# Patient Record
Sex: Female | Born: 1980 | Race: Black or African American | Hispanic: No | Smoking: Never smoker
Health system: Southern US, Community
[De-identification: ages and names within clinical notes are randomized; demographics above are authoritative.]

## PROBLEM LIST (undated history)

## (undated) DIAGNOSIS — Z319 Encounter for procreative management, unspecified: Secondary | ICD-10-CM

## (undated) DIAGNOSIS — N898 Other specified noninflammatory disorders of vagina: Secondary | ICD-10-CM

## (undated) DIAGNOSIS — M6289 Other specified disorders of muscle: Secondary | ICD-10-CM

## (undated) DIAGNOSIS — N926 Irregular menstruation, unspecified: Secondary | ICD-10-CM

## (undated) DIAGNOSIS — R635 Abnormal weight gain: Secondary | ICD-10-CM

## (undated) DIAGNOSIS — G8929 Other chronic pain: Secondary | ICD-10-CM

## (undated) DIAGNOSIS — R102 Pelvic and perineal pain: Secondary | ICD-10-CM

## (undated) DIAGNOSIS — N343 Urethral syndrome, unspecified: Secondary | ICD-10-CM

## (undated) DIAGNOSIS — H8109 Meniere's disease, unspecified ear: Secondary | ICD-10-CM

## (undated) DIAGNOSIS — B009 Herpesviral infection, unspecified: Secondary | ICD-10-CM

## (undated) DIAGNOSIS — N649 Disorder of breast, unspecified: Secondary | ICD-10-CM

## (undated) DIAGNOSIS — I10 Essential (primary) hypertension: Secondary | ICD-10-CM

## (undated) HISTORY — DX: Herpesviral infection, unspecified: B00.9

## (undated) HISTORY — DX: Abnormal weight gain: R63.5

## (undated) HISTORY — PX: BREAST REDUCTION SURGERY: SHX8

## (undated) HISTORY — DX: Essential (primary) hypertension: I10

## (undated) HISTORY — DX: Other specified noninflammatory disorders of vagina: N89.8

## (undated) HISTORY — DX: Disorder of breast, unspecified: N64.9

## (undated) HISTORY — DX: Irregular menstruation, unspecified: N92.6

## (undated) HISTORY — PX: REDUCTION MAMMAPLASTY: SUR839

## (undated) HISTORY — DX: Encounter for procreative management, unspecified: Z31.9

---

## 2001-05-12 ENCOUNTER — Emergency Department (HOSPITAL_COMMUNITY): Admission: EM | Admit: 2001-05-12 | Discharge: 2001-05-12 | Payer: Self-pay | Admitting: Emergency Medicine

## 2001-05-12 ENCOUNTER — Encounter: Payer: Self-pay | Admitting: Emergency Medicine

## 2001-10-16 ENCOUNTER — Emergency Department (HOSPITAL_COMMUNITY): Admission: EM | Admit: 2001-10-16 | Discharge: 2001-10-17 | Payer: Self-pay | Admitting: *Deleted

## 2001-10-22 ENCOUNTER — Emergency Department (HOSPITAL_COMMUNITY): Admission: EM | Admit: 2001-10-22 | Discharge: 2001-10-22 | Payer: Self-pay | Admitting: *Deleted

## 2002-04-26 ENCOUNTER — Emergency Department (HOSPITAL_COMMUNITY): Admission: EM | Admit: 2002-04-26 | Discharge: 2002-04-26 | Payer: Self-pay | Admitting: Emergency Medicine

## 2002-06-03 ENCOUNTER — Emergency Department (HOSPITAL_COMMUNITY): Admission: EM | Admit: 2002-06-03 | Discharge: 2002-06-03 | Payer: Self-pay | Admitting: *Deleted

## 2002-09-23 ENCOUNTER — Emergency Department (HOSPITAL_COMMUNITY): Admission: EM | Admit: 2002-09-23 | Discharge: 2002-09-23 | Payer: Self-pay | Admitting: Emergency Medicine

## 2003-02-23 ENCOUNTER — Emergency Department (HOSPITAL_COMMUNITY): Admission: EM | Admit: 2003-02-23 | Discharge: 2003-02-23 | Payer: Self-pay | Admitting: Emergency Medicine

## 2003-07-07 ENCOUNTER — Emergency Department (HOSPITAL_COMMUNITY): Admission: EM | Admit: 2003-07-07 | Discharge: 2003-07-07 | Payer: Self-pay | Admitting: Emergency Medicine

## 2003-07-07 ENCOUNTER — Encounter: Payer: Self-pay | Admitting: Emergency Medicine

## 2003-08-21 ENCOUNTER — Emergency Department (HOSPITAL_COMMUNITY): Admission: EM | Admit: 2003-08-21 | Discharge: 2003-08-21 | Payer: Self-pay | Admitting: Emergency Medicine

## 2003-08-31 ENCOUNTER — Emergency Department (HOSPITAL_COMMUNITY): Admission: EM | Admit: 2003-08-31 | Discharge: 2003-08-31 | Payer: Self-pay | Admitting: Emergency Medicine

## 2004-04-04 ENCOUNTER — Emergency Department (HOSPITAL_COMMUNITY): Admission: EM | Admit: 2004-04-04 | Discharge: 2004-04-04 | Payer: Self-pay | Admitting: Emergency Medicine

## 2004-08-08 ENCOUNTER — Emergency Department (HOSPITAL_COMMUNITY): Admission: EM | Admit: 2004-08-08 | Discharge: 2004-08-08 | Payer: Self-pay | Admitting: Interventional Radiology

## 2004-08-18 ENCOUNTER — Emergency Department: Payer: Self-pay | Admitting: Emergency Medicine

## 2004-09-08 ENCOUNTER — Emergency Department (HOSPITAL_COMMUNITY): Admission: EM | Admit: 2004-09-08 | Discharge: 2004-09-08 | Payer: Self-pay | Admitting: Emergency Medicine

## 2004-09-25 ENCOUNTER — Emergency Department (HOSPITAL_COMMUNITY): Admission: EM | Admit: 2004-09-25 | Discharge: 2004-09-25 | Payer: Self-pay | Admitting: Emergency Medicine

## 2004-10-16 ENCOUNTER — Emergency Department (HOSPITAL_COMMUNITY): Admission: EM | Admit: 2004-10-16 | Discharge: 2004-10-16 | Payer: Self-pay | Admitting: *Deleted

## 2005-03-30 ENCOUNTER — Emergency Department (HOSPITAL_COMMUNITY): Admission: EM | Admit: 2005-03-30 | Discharge: 2005-03-30 | Payer: Self-pay | Admitting: Emergency Medicine

## 2005-12-07 ENCOUNTER — Emergency Department (HOSPITAL_COMMUNITY): Admission: EM | Admit: 2005-12-07 | Discharge: 2005-12-07 | Payer: Self-pay | Admitting: Emergency Medicine

## 2007-04-21 ENCOUNTER — Emergency Department (HOSPITAL_COMMUNITY): Admission: EM | Admit: 2007-04-21 | Discharge: 2007-04-22 | Payer: Self-pay | Admitting: Emergency Medicine

## 2007-11-03 ENCOUNTER — Emergency Department (HOSPITAL_COMMUNITY): Admission: EM | Admit: 2007-11-03 | Discharge: 2007-11-03 | Payer: Self-pay | Admitting: Emergency Medicine

## 2007-11-04 ENCOUNTER — Ambulatory Visit (HOSPITAL_COMMUNITY): Admission: RE | Admit: 2007-11-04 | Discharge: 2007-11-04 | Payer: Self-pay | Admitting: Emergency Medicine

## 2008-06-14 ENCOUNTER — Emergency Department (HOSPITAL_COMMUNITY): Admission: EM | Admit: 2008-06-14 | Discharge: 2008-06-14 | Payer: Self-pay | Admitting: Emergency Medicine

## 2009-03-09 ENCOUNTER — Emergency Department (HOSPITAL_COMMUNITY): Admission: EM | Admit: 2009-03-09 | Discharge: 2009-03-09 | Payer: Self-pay | Admitting: Emergency Medicine

## 2009-09-28 HISTORY — PX: BREAST BIOPSY: SHX20

## 2010-01-04 ENCOUNTER — Emergency Department: Payer: Self-pay | Admitting: Unknown Physician Specialty

## 2010-01-04 ENCOUNTER — Emergency Department: Payer: Self-pay | Admitting: Internal Medicine

## 2010-01-17 ENCOUNTER — Emergency Department: Payer: Self-pay | Admitting: Emergency Medicine

## 2010-03-15 ENCOUNTER — Emergency Department: Payer: Self-pay | Admitting: Emergency Medicine

## 2010-05-20 ENCOUNTER — Emergency Department: Payer: Self-pay | Admitting: Emergency Medicine

## 2010-07-09 ENCOUNTER — Ambulatory Visit (HOSPITAL_COMMUNITY): Admission: RE | Admit: 2010-07-09 | Discharge: 2010-07-09 | Payer: Self-pay | Admitting: Family Medicine

## 2010-07-16 ENCOUNTER — Ambulatory Visit (HOSPITAL_COMMUNITY): Admission: RE | Admit: 2010-07-16 | Discharge: 2010-07-16 | Payer: Self-pay | Admitting: Family Medicine

## 2010-08-02 ENCOUNTER — Emergency Department: Payer: Self-pay | Admitting: Emergency Medicine

## 2011-01-05 LAB — URINE CULTURE: Colony Count: >=100000

## 2011-01-05 LAB — GC/CHLAMYDIA PROBE AMP, GENITAL: GC Probe Amp, Genital: NEGATIVE

## 2011-01-05 LAB — URINALYSIS, ROUTINE W REFLEX MICROSCOPIC
Bilirubin Urine: NEGATIVE
Hgb urine dipstick: NEGATIVE
Ketones, ur: NEGATIVE mg/dL
Nitrite: NEGATIVE

## 2011-01-05 LAB — WET PREP, GENITAL

## 2011-01-21 ENCOUNTER — Emergency Department (HOSPITAL_COMMUNITY)
Admission: EM | Admit: 2011-01-21 | Discharge: 2011-01-21 | Disposition: A | Discharge status: Home / Self Care | Payer: Self-pay | Attending: Emergency Medicine | Admitting: Emergency Medicine

## 2011-01-21 DIAGNOSIS — R109 Unspecified abdominal pain: Secondary | ICD-10-CM | POA: Insufficient documentation

## 2011-01-21 DIAGNOSIS — N898 Other specified noninflammatory disorders of vagina: Secondary | ICD-10-CM | POA: Insufficient documentation

## 2011-01-21 LAB — WET PREP, GENITAL: Yeast Wet Prep HPF POC: NONE SEEN

## 2011-01-21 LAB — URINALYSIS, ROUTINE W REFLEX MICROSCOPIC
Ketones, ur: NEGATIVE mg/dL
Nitrite: NEGATIVE
Protein, ur: NEGATIVE mg/dL

## 2011-01-21 LAB — POCT PREGNANCY, URINE: Preg Test, Ur: NEGATIVE

## 2011-01-23 LAB — URINE CULTURE: Culture  Setup Time: 201204261952

## 2011-04-30 ENCOUNTER — Encounter: Payer: Self-pay | Admitting: *Deleted

## 2011-04-30 ENCOUNTER — Emergency Department (HOSPITAL_COMMUNITY)
Admission: EM | Admit: 2011-04-30 | Discharge: 2011-04-30 | Discharge status: Against Medical Advice | Payer: BC Managed Care – PPO | Attending: Emergency Medicine | Admitting: Emergency Medicine

## 2011-04-30 DIAGNOSIS — E876 Hypokalemia: Secondary | ICD-10-CM | POA: Insufficient documentation

## 2011-04-30 DIAGNOSIS — N760 Acute vaginitis: Secondary | ICD-10-CM | POA: Insufficient documentation

## 2011-04-30 LAB — COMPREHENSIVE METABOLIC PANEL
AST: 19 U/L (ref 0–37)
CO2: 24 mEq/L (ref 19–32)
Calcium: 9.7 mg/dL (ref 8.4–10.5)
Creatinine, Ser: 0.73 mg/dL (ref 0.50–1.10)
GFR calc Af Amer: >60 mL/min (ref >60)
GFR calc non Af Amer: >60 mL/min (ref >60)
Glucose, Bld: 87 mg/dL (ref 70–99)

## 2011-04-30 LAB — CBC
HCT: 35.7 % — ABNORMAL LOW (ref 36.0–46.0)
MCH: 29 pg (ref 26.0–34.0)
MCV: 87.1 fL (ref 78.0–100.0)
RDW: 12.5 % (ref 11.5–15.5)
WBC: 9.2 10*3/uL (ref 4.0–10.5)

## 2011-04-30 LAB — URINALYSIS, ROUTINE W REFLEX MICROSCOPIC
Bilirubin Urine: NEGATIVE
Hgb urine dipstick: NEGATIVE
Ketones, ur: NEGATIVE mg/dL
Protein, ur: NEGATIVE mg/dL
Specific Gravity, Urine: 1.025 (ref 1.005–1.030)
Urobilinogen, UA: 0.2 mg/dL (ref 0.0–1.0)

## 2011-04-30 LAB — DIFFERENTIAL
Basophils Absolute: 0 10*3/uL (ref 0.0–0.1)
Eosinophils Relative: 2 % (ref 0–5)
Lymphocytes Relative: 26 % (ref 12–46)
Monocytes Absolute: 0.6 10*3/uL (ref 0.1–1.0)

## 2011-04-30 LAB — WET PREP, GENITAL: Yeast Wet Prep HPF POC: NONE SEEN

## 2011-04-30 MED ORDER — POTASSIUM CHLORIDE CRYS ER 20 MEQ PO TBCR: 40.0000 meq | EXTENDED_RELEASE_TABLET | Freq: Once | ORAL | Status: DC

## 2011-04-30 MED ORDER — DOXYCYCLINE HYCLATE 100 MG PO CAPS: 100.0000 mg | ORAL_CAPSULE | Freq: Two times a day (BID) | ORAL | Status: AC

## 2011-04-30 MED ORDER — ONDANSETRON 8 MG PO TBDP
8.0000 mg | ORAL_TABLET | Freq: Once | ORAL | Status: AC
  Administered 2011-04-30: 8 mg via ORAL
  Filled 2011-04-30: qty 1

## 2011-04-30 MED ORDER — METRONIDAZOLE 500 MG PO TABS: 500.0000 mg | ORAL_TABLET | Freq: Two times a day (BID) | ORAL | Status: AC

## 2011-04-30 MED ORDER — CEFTRIAXONE SODIUM 1 G IJ SOLR: 0.5000 g | Freq: Once | INTRAMUSCULAR | Status: DC

## 2011-04-30 NOTE — ED Notes (Signed)
Sgt Badgett with [facility] called and stated that he "spoke with the patient and the patient said she took out her IV in the room."  RN informed Sgt that a deputy needed to visualize that the IV had been d/c.  Sgt Badgett then states, "Mam, I said I spoke to the patient and she said she took it out herself."  Sgt precedes to say that he asked the patient to contact [facility].  Pt's d/c papers and prescriptions left with secretary in case pt comes to pick them up.

## 2011-04-30 NOTE — ED Notes (Signed)
Checked in Waiting room and in parking lot.  Pt not there.  Received call from [employer] - states pt's address is located in [place].  [facility] and spoke with Marta Antu.  Marta Antu. Will send deputy to pt's home and deputy will call charge RN and notify if IV was d/c.  Deputy will also notify pt of prescriptions left.

## 2011-04-30 NOTE — ED Notes (Signed)
Pt left without notifying staff and without receiving Rocephin injection and d/c papers with Rx's.  Called Pt's home number and pt's mother answered and stated that pt was not there.  Mother gave RN pt's cell number.  Called and left voicemail on cell to return call.  ED staff unsure if pt d/c IV.  Called [employer] and spoke with deputy Ronne Binning and asked to send deputy to pt's house, (Gave address on facesheet) to check pt's left arm for IV removal.  Told [employer] to contact staff at [facility] and notify if pt's IV was still in or not.  Also asked deputy to inform pt of rx's and pt needed to come by and pick up.

## 2011-04-30 NOTE — ED Notes (Signed)
Pt called back to dept after getting a call because she had left department prior to being discharged and her IV taken out.  Pt stated, "she took her own IV out and put tissue on it to stop the bleeding, also stated that she had to leave to go to work".

## 2011-04-30 NOTE — ED Provider Notes (Signed)
History     CSN: [id number] Arrival date & time: 04/30/2011  1:52 PM  Chief Complaint  Patient presents with  . Abdominal Pain   HPI Comments: Patient c/o lower abd/pelvic pain and dizziness intermittently for four days.  Dizziness is mainly with position change.  States she has also had few episodes of diarrhea and persistent nausea but denies vomiting.  She also noticed irritation to her vagina sometimes when she voids.  She denies denies new sexual partners or recent unprotected intercourse.  States she had similar episode of symptoms few months ago and was diagnosed with a UTI.    Patient is a 30 y.o. female presenting with cramps. The history is provided by the patient.  Abdominal Cramping The primary symptoms of the illness include abdominal pain, nausea and diarrhea. The primary symptoms of the illness do not include fever, fatigue, shortness of breath, vomiting, hematemesis, hematochezia, dysuria, vaginal discharge or vaginal bleeding. Primary symptoms comment: vaginal irritation The current episode started more than 2 days ago. The onset of the illness was gradual. The problem has not changed since onset. The abdominal pain began more than 2 days ago. The abdominal pain has been unchanged since its onset. The abdominal pain is located in the suprapubic region. The abdominal pain does not radiate. The abdominal pain is relieved by nothing. Exacerbated by: nothing.  Nausea began 3 to 5 days ago. The nausea is associated with eating. The nausea is exacerbated by food and stress.  The diarrhea began 3 to 5 days ago. The diarrhea is semi-solid. The diarrhea occurs 2 to 4 times per day.  The patient states that she believes she is currently not pregnant. The patient has had a change in bowel habit. Symptoms associated with the illness do not include chills, anorexia, heartburn, constipation, urgency, hematuria, frequency or back pain. Significant associated medical issues do not include  inflammatory bowel disease, diabetes, gallstones or substance abuse.    History reviewed. No pertinent past medical history.  History reviewed. No pertinent past surgical history.  History reviewed. No pertinent family history.  History  Substance Use Topics  . Smoking status: Never Smoker   . Smokeless tobacco: Not on file  . Alcohol Use: No    OB History    Grav Para Term Preterm Abortions TAB SAB Ect Mult Living   0               Review of Systems  Constitutional: Negative for fever, chills, appetite change and fatigue.  HENT: Negative for sore throat, neck pain and neck stiffness.   Respiratory: Negative for cough, chest tightness and shortness of breath.   Cardiovascular: Negative.   Gastrointestinal: Positive for nausea, abdominal pain and diarrhea. Negative for heartburn, vomiting, constipation, blood in stool, hematochezia, abdominal distention, anorexia and hematemesis.  Genitourinary: Positive for vaginal pain. Negative for dysuria, urgency, frequency, hematuria, flank pain, vaginal bleeding and vaginal discharge.  Musculoskeletal: Negative.  Negative for back pain.  Skin: Negative.   Neurological: Positive for dizziness. Negative for speech difficulty, weakness, numbness and headaches.  Hematological: Does not bruise/bleed easily.    Physical Exam  BP 121/78  Pulse 82  Temp(Src) 98.5 F (36.9 C) (Oral)  Resp 20  Ht 5\' 6"  (1.676 m)  Wt 162 lb (73.483 kg)  BMI 26.15 kg/m2  SpO2 100%  LMP 04/03/2011  Physical Exam  Nursing note and vitals reviewed. Constitutional: She is oriented to person, place, and time. She appears well-developed and well-nourished.  Non-toxic appearance. She  does not have a sickly appearance. She does not appear ill. No distress.  HENT:  Head: Normocephalic and atraumatic.  Right Ear: External ear normal.  Left Ear: External ear normal.  Mouth/Throat: Oropharynx is clear and moist.  Eyes: Pupils are equal, round, and reactive to  light.  Neck: Normal range of motion. Neck supple. No thyromegaly present.  Cardiovascular: Normal rate, regular rhythm and normal heart sounds.   Pulmonary/Chest: Effort normal and breath sounds normal.  Abdominal: Soft. She exhibits no distension and no mass. There is no tenderness. There is no rebound and no guarding.  Genitourinary: Uterus normal. Cervix exhibits motion tenderness. Cervix exhibits no discharge and no friability. Right adnexum displays no mass and no tenderness. Left adnexum displays no mass and no tenderness. No erythema, tenderness or bleeding around the vagina. No foreign body around the vagina. Vaginal discharge found.  Musculoskeletal: Normal range of motion.  Lymphadenopathy:    She has no cervical adenopathy.  Neurological: She is alert and oriented to person, place, and time. No cranial nerve deficit. She exhibits normal muscle tone. Coordination normal.  Skin: Skin is warm and dry.  Psychiatric: She has a normal mood and affect.    ED Course  Procedures  MDM   1640  Patient is resting, feels better.  abd remains soft, NT.  Pt is non-toxic appearing.  Urine, GC and Chlamydia cultures are pending.  Vitals are stable.  Wet prep was negative for trich, BV or yeast.  I have discussed lab results with the patient and she agrees to close f/u with her PMD. for recheck and to have her potassium rechecked also.  I was notified by the nursing staff that upon discharge, pt was not in the room or waiting area.  She left prior to d/c of her IV.  [place] police was contacted by nursing staff and I was informed by the nurse that she had spoken with an Clinical biochemist contacted the patient and told that she removed the IV herself and left w/o informing staff because she had to go to work.      Results for orders placed during the hospital encounter of 04/30/11  URINALYSIS, ROUTINE W REFLEX MICROSCOPIC      Component Value Range   Color, Urine YELLOW  YELLOW     Appearance CLEAR  CLEAR    Specific Gravity, Urine 1.025  1.005 - 1.030    pH 6.0  5.0 - 8.0    Glucose, UA NEGATIVE  NEGATIVE (mg/dL)   Hgb urine dipstick NEGATIVE  NEGATIVE    Bilirubin Urine NEGATIVE  NEGATIVE    Ketones, ur NEGATIVE  NEGATIVE (mg/dL)   Protein, ur NEGATIVE  NEGATIVE (mg/dL)   Urobilinogen, UA 0.2  0.0 - 1.0 (mg/dL)   Nitrite NEGATIVE  NEGATIVE    Leukocytes, UA NEGATIVE  NEGATIVE   PREGNANCY, URINE      Component Value Range   Preg Test, Ur NEGATIVE    CBC      Component Value Range   WBC 9.2  4.0 - 10.5 (K/uL)   RBC 4.10  3.87 - 5.11 (MIL/uL)   Hemoglobin 11.9 (*) 12.0 - 15.0 (g/dL)   HCT 16.1 (*) 09.6 - 46.0 (%)   MCV 87.1  78.0 - 100.0 (fL)   MCH 29.0  26.0 - 34.0 (pg)   MCHC 33.3  30.0 - 36.0 (g/dL)   RDW 04.5  40.9 - 81.1 (%)   Platelets 273  150 - 400 (K/uL)  DIFFERENTIAL      Component Value Range   Neutrophils Relative 65  43 - 77 (%)   Neutro Abs 6.0  1.7 - 7.7 (K/uL)   Lymphocytes Relative 26  12 - 46 (%)   Lymphs Abs 2.4  0.7 - 4.0 (K/uL)   Monocytes Relative 7  3 - 12 (%)   Monocytes Absolute 0.6  0.1 - 1.0 (K/uL)   Eosinophils Relative 2  0 - 5 (%)   Eosinophils Absolute 0.2  0.0 - 0.7 (K/uL)   Basophils Relative 0  0 - 1 (%)   Basophils Absolute 0.0  0.0 - 0.1 (K/uL)  COMPREHENSIVE METABOLIC PANEL      Component Value Range   Sodium 139  135 - 145 (mEq/L)   Potassium 3.2 (*) 3.5 - 5.1 (mEq/L)   Chloride 102  96 - 112 (mEq/L)   CO2 24  19 - 32 (mEq/L)   Glucose, Bld 87  70 - 99 (mg/dL)   BUN 9  6 - 23 (mg/dL)   Creatinine, Ser 1.61  0.50 - 1.10 (mg/dL)   Calcium 9.7  8.4 - 09.6 (mg/dL)   Total Protein 8.0  6.0 - 8.3 (g/dL)   Albumin 3.8  3.5 - 5.2 (g/dL)   AST 19  0 - 37 (U/L)   ALT 16  0 - 35 (U/L)   Alkaline Phosphatase 82  39 - 117 (U/L)   Total Bilirubin 0.2 (*) 0.3 - 1.2 (mg/dL)   GFR calc non Af Amer >60  >60 (mL/min)   GFR calc Af Amer >60  >60 (mL/min)         Jaydence Vanyo L. Shekelia Boutin, [place] 05/01/11 1502

## 2011-04-30 NOTE — ED Notes (Signed)
Pt c/o lower abdominal pain and dizziness x 4 days. Also c/o nausea and diarrhea. Pt c/o vaginal irritation.

## 2011-05-01 LAB — URINE CULTURE

## 2011-05-01 LAB — GC/CHLAMYDIA PROBE AMP, GENITAL: Chlamydia, DNA Probe: NEGATIVE

## 2011-05-13 NOTE — ED Provider Notes (Signed)
Patient's care discussed with me and I have a reviewed with provider.  Hilario Quarry, MD 05/13/11 [phone]

## 2011-06-18 ENCOUNTER — Emergency Department (HOSPITAL_COMMUNITY)
Admission: EM | Admit: 2011-06-18 | Discharge: 2011-06-18 | Disposition: A | Discharge status: Home / Self Care | Payer: BC Managed Care – PPO | Attending: Emergency Medicine | Admitting: Emergency Medicine

## 2011-06-18 ENCOUNTER — Encounter (HOSPITAL_COMMUNITY): Payer: Self-pay | Admitting: *Deleted

## 2011-06-18 DIAGNOSIS — J020 Streptococcal pharyngitis: Secondary | ICD-10-CM

## 2011-06-18 DIAGNOSIS — Z91040 Latex allergy status: Secondary | ICD-10-CM | POA: Insufficient documentation

## 2011-06-18 DIAGNOSIS — Z880 Allergy status to penicillin: Secondary | ICD-10-CM | POA: Insufficient documentation

## 2011-06-18 DIAGNOSIS — Z91038 Other insect allergy status: Secondary | ICD-10-CM | POA: Insufficient documentation

## 2011-06-18 MED ORDER — AZITHROMYCIN 250 MG PO TABS: 250.0000 mg | ORAL_TABLET | Freq: Every day | ORAL | Status: AC

## 2011-06-18 NOTE — ED Notes (Signed)
Pt c/o sore throat for 1 week. Pt states she was dx with strep throat a week ago and still has sore throat.

## 2011-06-18 NOTE — ED Provider Notes (Signed)
History     CSN: [id number] Arrival date & time: 06/18/2011 11:44 AM   Chief Complaint  Patient presents with  . Sore Throat     (Include location/radiation/quality/duration/timing/severity/associated sxs/prior treatment) Patient is a 30 y.o. female presenting with pharyngitis. The history is provided by the patient.  Sore Throat This is a new problem. The current episode started in the past 7 days. The problem occurs constantly. The problem has been unchanged. Associated symptoms include chills and a sore throat. Pertinent negatives include no abdominal pain, arthralgias, chest pain, congestion, fever, headaches, joint swelling, nausea, neck pain, numbness, rash or weakness. Associated symptoms comments: Was seen at an outside ed 5 days ago,  Diagnosed with strep throat.  She was given a 4 day prescription for zithromax which she finished 2 days ago.  She does not feel better. . The symptoms are aggravated by swallowing. She has tried acetaminophen for the symptoms. The treatment provided no relief.     History reviewed. No pertinent past medical history.   History reviewed. No pertinent past surgical history.  History reviewed. No pertinent family history.  History  Substance Use Topics  . Smoking status: Never Smoker   . Smokeless tobacco: Not on file  . Alcohol Use: No    OB History    Grav Para Term Preterm Abortions TAB SAB Ect Mult Living   0               Review of Systems  Constitutional: Positive for chills. Negative for fever.  HENT: Positive for sore throat. Negative for ear pain, congestion, rhinorrhea, trouble swallowing, neck pain and voice change.   Eyes: Negative.   Respiratory: Negative for chest tightness and shortness of breath.   Cardiovascular: Negative for chest pain.  Gastrointestinal: Negative for nausea and abdominal pain.  Genitourinary: Negative.   Musculoskeletal: Negative for joint swelling and arthralgias.  Skin: Negative.  Negative for  rash and wound.  Neurological: Negative for dizziness, weakness, light-headedness, numbness and headaches.  Hematological: Negative.   Psychiatric/Behavioral: Negative.     Allergies  Bee venom; Latex; and Penicillins  Home Medications   Current Outpatient Rx  Name Route Sig Dispense Refill  . ACETAMINOPHEN 325 MG PO TABS Oral Take 650 mg by mouth once as needed. For pai     . ONE-A-DAY WOMENS PO Oral Take 1 tablet by mouth daily.        Physical Exam    BP 119/83  Pulse 82  Temp(Src) 98.9 F (37.2 C) (Oral)  Resp 18  Ht 5\' 6"  (1.676 m)  Wt 162 lb (73.483 kg)  BMI 26.15 kg/m2  SpO2 100%  LMP 06/02/2011  Physical Exam  Nursing note and vitals reviewed. Constitutional: She is oriented to person, place, and time. She appears well-developed and well-nourished.  HENT:  Head: Normocephalic and atraumatic.  Right Ear: External ear normal.  Left Ear: External ear normal.  Nose: Nose normal.  Mouth/Throat: Uvula is midline and mucous membranes are normal. Posterior oropharyngeal erythema present. No oropharyngeal exudate, posterior oropharyngeal edema or tonsillar abscesses.  Eyes: Conjunctivae are normal.  Neck: Normal range of motion.  Cardiovascular: Normal rate, regular rhythm, normal heart sounds and intact distal pulses.   Pulmonary/Chest: Effort normal and breath sounds normal. No respiratory distress. She has no wheezes.  Abdominal: Soft. Bowel sounds are normal. There is no tenderness.  Musculoskeletal: Normal range of motion.  Lymphadenopathy:    She has cervical adenopathy.       Right cervical: Superficial  cervical adenopathy present.       Left cervical: Superficial cervical adenopathy present.    She has no axillary adenopathy.  Neurological: She is alert and oriented to person, place, and time.  Skin: Skin is warm and dry.  Psychiatric: She has a normal mood and affect.    ED Course  Procedures        MDM Patient diagnosed with strep throat at  outside hospital 5 days ago.  Given prescription for 4 zithromax tabs,  Not full zpack dosing.  Will repeat abx tx.       Candis Musa, PA 06/18/11 1241

## 2011-06-19 LAB — COMPREHENSIVE METABOLIC PANEL
ALT: 12
AST: 19
Alkaline Phosphatase: 63
CO2: 27
Chloride: 107
Creatinine, Ser: 0.72
GFR calc Af Amer: >60
GFR calc non Af Amer: >60
Potassium: 3.6
Sodium: 137
Total Bilirubin: 0.4

## 2011-06-19 LAB — URINALYSIS, ROUTINE W REFLEX MICROSCOPIC
Hgb urine dipstick: NEGATIVE
Specific Gravity, Urine: 1.025
Urobilinogen, UA: 0.2

## 2011-06-19 LAB — CBC
MCV: 85.7
RBC: 3.88
WBC: 9.4

## 2011-06-19 LAB — DIFFERENTIAL
Basophils Absolute: 0
Basophils Relative: 0
Eosinophils Absolute: 0.1
Eosinophils Relative: 1
Monocytes Absolute: 0.8

## 2011-06-19 LAB — GC/CHLAMYDIA PROBE AMP, GENITAL: GC Probe Amp, Genital: NEGATIVE

## 2011-06-25 NOTE — ED Provider Notes (Signed)
Medical screening examination/treatment/procedure(s) were performed by non-physician practitioner and as supervising physician I was immediately available for consultation/collaboration.  Lucresia Simic, MD 06/25/11 0431 

## 2011-06-29 LAB — URINALYSIS, ROUTINE W REFLEX MICROSCOPIC
Glucose, UA: NEGATIVE
Nitrite: NEGATIVE
pH: 6

## 2011-06-29 LAB — GC/CHLAMYDIA PROBE AMP, GENITAL: GC Probe Amp, Genital: NEGATIVE

## 2011-06-29 LAB — PREGNANCY, URINE: Preg Test, Ur: NEGATIVE

## 2011-06-29 LAB — RPR: RPR Ser Ql: NONREACTIVE

## 2011-06-29 LAB — WET PREP, GENITAL: Yeast Wet Prep HPF POC: NONE SEEN

## 2011-07-13 LAB — COMPREHENSIVE METABOLIC PANEL
ALT: 20
AST: 24
Alkaline Phosphatase: 68
CO2: 26
Calcium: 8.7
Chloride: 106
GFR calc Af Amer: >60
GFR calc non Af Amer: >60
Glucose, Bld: 122 — ABNORMAL HIGH
Potassium: 3.5
Sodium: 138
Total Bilirubin: 0.3

## 2011-07-13 LAB — DIFFERENTIAL
Basophils Absolute: 0
Basophils Relative: 0
Eosinophils Absolute: 0.1
Eosinophils Relative: 1
Lymphs Abs: 2.7
Neutrophils Relative %: 66

## 2011-07-13 LAB — URINALYSIS, ROUTINE W REFLEX MICROSCOPIC
Bilirubin Urine: NEGATIVE
Hgb urine dipstick: NEGATIVE
Nitrite: NEGATIVE
Specific Gravity, Urine: >1.03 — ABNORMAL HIGH
Urobilinogen, UA: 0.2
pH: 6

## 2011-07-13 LAB — CBC
Hemoglobin: 12.1
MCHC: 34.4
RBC: 4.1
WBC: 10.1

## 2011-07-13 LAB — LIPASE, BLOOD: Lipase: 18

## 2011-07-13 LAB — PREGNANCY, URINE: Preg Test, Ur: NEGATIVE

## 2011-07-15 DIAGNOSIS — N979 Female infertility, unspecified: Secondary | ICD-10-CM | POA: Insufficient documentation

## 2011-08-06 ENCOUNTER — Other Ambulatory Visit (HOSPITAL_COMMUNITY): Payer: Self-pay | Admitting: Obstetrics and Gynecology

## 2011-08-06 DIAGNOSIS — N979 Female infertility, unspecified: Secondary | ICD-10-CM

## 2011-09-12 ENCOUNTER — Emergency Department (HOSPITAL_COMMUNITY)
Admission: EM | Admit: 2011-09-12 | Discharge: 2011-09-12 | Disposition: A | Discharge status: Home / Self Care | Payer: BC Managed Care – PPO | Attending: Emergency Medicine | Admitting: Emergency Medicine

## 2011-09-12 ENCOUNTER — Encounter (HOSPITAL_COMMUNITY): Payer: Self-pay | Admitting: Emergency Medicine

## 2011-09-12 DIAGNOSIS — R109 Unspecified abdominal pain: Secondary | ICD-10-CM | POA: Insufficient documentation

## 2011-09-12 DIAGNOSIS — N760 Acute vaginitis: Secondary | ICD-10-CM | POA: Insufficient documentation

## 2011-09-12 DIAGNOSIS — A499 Bacterial infection, unspecified: Secondary | ICD-10-CM | POA: Insufficient documentation

## 2011-09-12 DIAGNOSIS — B9689 Other specified bacterial agents as the cause of diseases classified elsewhere: Secondary | ICD-10-CM | POA: Insufficient documentation

## 2011-09-12 LAB — URINALYSIS, ROUTINE W REFLEX MICROSCOPIC
Bilirubin Urine: NEGATIVE
Ketones, ur: NEGATIVE mg/dL
Protein, ur: NEGATIVE mg/dL
Urobilinogen, UA: 0.2 mg/dL (ref 0.0–1.0)
pH: 6.5 (ref 5.0–8.0)

## 2011-09-12 LAB — POCT PREGNANCY, URINE: Preg Test, Ur: NEGATIVE

## 2011-09-12 LAB — WET PREP, GENITAL

## 2011-09-12 MED ORDER — METRONIDAZOLE 0.75 % VA GEL: 1.0000 | Freq: Every day | VAGINAL | Status: AC

## 2011-09-12 MED ORDER — METRONIDAZOLE 500 MG PO TABS: 500.0000 mg | ORAL_TABLET | Freq: Two times a day (BID) | ORAL | Status: DC

## 2011-09-12 NOTE — ED Notes (Signed)
Patient with no complaints at this time. Respirations even and unlabored. Skin warm/dry. Discharge instructions reviewed with patient at this time. Patient given opportunity to voice concerns/ask questions. Patient discharged at this time and left Emergency Department with steady gait.   

## 2011-09-12 NOTE — ED Provider Notes (Signed)
Scribed for Laray Anger, DO, the patient was seen in room [facility] . This chart was scribed by Ellie Lunch.   CSN: [id number] Arrival date & time: 09/12/2011  3:07 PM    Chief Complaint  Patient presents with  . Abdominal Pain   HPI Pt seen at 4:16 PM Rachel Stevens is a 30 y.o. female who presents to the Emergency Department complaining of gradual onset and persistence of constant vaginal discharge for the past week.  Has been associated with vaginal "itching," and mild suprapubic pelvic "pain."  Denies vaginal bleeding, no dysuria, no flank pain, no rash, no fevers, no N/V/D.      History reviewed. No pertinent past medical history.  History reviewed. No pertinent past surgical history.  History  Substance Use Topics  . Smoking status: Never Smoker   . Smokeless tobacco: Not on file  . Alcohol Use: No    OB History    Grav Para Term Preterm Abortions TAB SAB Ect Mult Living   0               Review of Systems ROS: Statement: All systems negative except as marked or noted in the HPI; Constitutional: Negative for fever and chills. ; ; Eyes: Negative for eye pain, redness and discharge. ; ; ENMT: Negative for ear pain, hoarseness, nasal congestion, sinus pressure and sore throat. ; ; Cardiovascular: Negative for chest pain, palpitations, diaphoresis, dyspnea and peripheral edema. ; ; Respiratory: Negative for cough, wheezing and stridor. ; ; Gastrointestinal: Negative for nausea, vomiting, diarrhea, abdominal pain, blood in stool, hematemesis, jaundice and rectal bleeding. . ; ; Genitourinary: Negative for dysuria, flank pain and hematuria. GYN:  No vaginal bleeding, +vaginal itching and discharge, no vulvar pain.  ; ; Musculoskeletal: Negative for back pain and neck pain. Negative for swelling and trauma.; ; Skin: Negative for pruritus, rash, abrasions, blisters, bruising and skin lesion.; ; Neuro: Negative for headache, lightheadedness and neck stiffness. Negative for  weakness, altered level of consciousness , altered mental status, extremity weakness, paresthesias, involuntary movement, seizure and syncope.       Allergies  Bee venom; Latex; and Penicillins  Home Medications   Current Outpatient Rx  Name Route Sig Dispense Refill  . ACETAMINOPHEN 325 MG PO TABS Oral Take 650 mg by mouth once as needed. For pai     . ONE-A-DAY WOMENS PO Oral Take 1 tablet by mouth daily.        BP 123/67  Pulse 85  Temp(Src) 99.5 F (37.5 C) (Oral)  Resp 20  Ht 5\' 6"  (1.676 m)  Wt 140 lb (63.504 kg)  BMI 22.60 kg/m2  SpO2 100%  LMP 09/01/2011  Physical Exam 1620: Physical examination:  Nursing notes reviewed; Vital signs and O2 SAT reviewed;  Constitutional: Well developed, Well nourished, Well hydrated, In no acute distress; Head:  Normocephalic, atraumatic; Eyes: EOMI, PERRL, No scleral icterus; ENMT: Mouth and pharynx normal, Mucous membranes moist; Neck: Supple, Full range of motion, No lymphadenopathy; Cardiovascular: Regular rate and rhythm, No murmur, rub, or gallop; Respiratory: Breath sounds clear & equal bilaterally, No rales, rhonchi, wheezes, or rub, Normal respiratory effort/excursion; Chest: Nontender, Movement normal; Abdomen: Soft, Nontender, Nondistended, Normal bowel sounds; Genitourinary: No CVA tenderness, Pelvic exam performed with permission of pt and female ED tech assist during exam.  External genitalia w/o lesions. Vaginal vault with thin white discharge.  Cervix w/o lesions, not friable, GC/chlam and wet prep obtained and sent to lab.  Bimanual exam w/o CMT,  uterine or adenexal tenderness. Extremities: Pulses normal, No tenderness, No edema, No calf edema or asymmetry.; Neuro: AA&Ox3, Major CN grossly intact.  No gross focal motor or sensory deficits in extremities.; Skin: Color normal, Warm, Dry, no rash.    ED Course  Procedures    MDM  MDM Reviewed: nursing note and vitals Interpretation: labs   Results for orders placed  during the hospital encounter of 09/12/11  URINALYSIS, ROUTINE W REFLEX MICROSCOPIC      Component Value Range   Color, Urine YELLOW  YELLOW    APPearance CLEAR  CLEAR    Specific Gravity, Urine 1.020  1.005 - 1.030    pH 6.5  5.0 - 8.0    Glucose, UA NEGATIVE  NEGATIVE (mg/dL)   Hgb urine dipstick NEGATIVE  NEGATIVE    Bilirubin Urine NEGATIVE  NEGATIVE    Ketones, ur NEGATIVE  NEGATIVE (mg/dL)   Protein, ur NEGATIVE  NEGATIVE (mg/dL)   Urobilinogen, UA 0.2  0.0 - 1.0 (mg/dL)   Nitrite NEGATIVE  NEGATIVE    Leukocytes, UA NEGATIVE  NEGATIVE   POCT PREGNANCY, URINE      Component Value Range   Preg Test, Ur NEGATIVE    WET PREP, GENITAL      Component Value Range   Yeast, Wet Prep NONE SEEN  NONE SEEN    Trich, Wet Prep NONE SEEN  NONE SEEN    Clue Cells, Wet Prep MODERATE (*) NONE SEEN    WBC, Wet Prep HPF POC FEW (*) NONE SEEN     6:07 PM:  +BV, GC/chlam pending.  Will tx for BV.  Dx testing d/w pt.  Questions answered.  Verb understanding, agreeable to d/c home with outpt f/u.        I personally performed the services described in this documentation, which was scribed in my presence. The recorded information has been reviewed and considered.  Shaelynn Dragos Allison Quarry, DO 09/13/11 [phone]

## 2011-09-12 NOTE — ED Notes (Signed)
Pt c/o lower abd pain x one week.

## 2011-09-30 ENCOUNTER — Other Ambulatory Visit (HOSPITAL_COMMUNITY): Payer: Self-pay | Admitting: Obstetrics and Gynecology

## 2011-09-30 DIAGNOSIS — N979 Female infertility, unspecified: Secondary | ICD-10-CM

## 2011-10-02 ENCOUNTER — Ambulatory Visit (HOSPITAL_COMMUNITY)
Admission: RE | Admit: 2011-10-02 | Discharge: 2011-10-02 | Disposition: A | Discharge status: Home / Self Care | Payer: BC Managed Care – PPO | Source: Ambulatory Visit | Attending: Obstetrics and Gynecology | Admitting: Obstetrics and Gynecology

## 2011-10-02 DIAGNOSIS — N979 Female infertility, unspecified: Secondary | ICD-10-CM

## 2011-10-02 MED ORDER — IOHEXOL 300 MG/ML  SOLN
7.0000 mL | Freq: Once | INTRAMUSCULAR | Status: AC | PRN
  Administered 2011-10-02: 7 mL

## 2011-10-26 ENCOUNTER — Emergency Department (HOSPITAL_COMMUNITY)
Admission: EM | Admit: 2011-10-26 | Discharge: 2011-10-27 | Disposition: A | Discharge status: Home / Self Care | Payer: BC Managed Care – PPO | Attending: Emergency Medicine | Admitting: Emergency Medicine

## 2011-10-26 ENCOUNTER — Encounter (HOSPITAL_COMMUNITY): Payer: Self-pay | Admitting: *Deleted

## 2011-10-26 DIAGNOSIS — R51 Headache: Secondary | ICD-10-CM

## 2011-10-26 MED ORDER — ACETAMINOPHEN 500 MG PO TABS
1000.0000 mg | ORAL_TABLET | Freq: Once | ORAL | Status: AC
  Administered 2011-10-27: 1000 mg via ORAL
  Filled 2011-10-26: qty 2

## 2011-10-26 MED ORDER — IBUPROFEN 800 MG PO TABS
800.0000 mg | ORAL_TABLET | Freq: Once | ORAL | Status: AC
  Administered 2011-10-27: 800 mg via ORAL
  Filled 2011-10-26: qty 2

## 2011-10-26 MED ORDER — BUTALBITAL-APAP-CAFFEINE 50-325-40 MG PO TABS: 1.0000 | ORAL_TABLET | Freq: Four times a day (QID) | ORAL | Status: DC | PRN

## 2011-10-26 NOTE — ED Provider Notes (Signed)
History     CSN: [id number]  Arrival date & time 10/26/11  2305   First MD Initiated Contact with Patient 10/26/11 2311      Chief Complaint  Patient presents with  . Headache    x 5 days    (Consider location/radiation/quality/duration/timing/severity/associated sxs/prior treatment) HPI  History reviewed. No pertinent past medical history.  History reviewed. No pertinent past surgical history.  No family history on file.  History  Substance Use Topics  . Smoking status: Never Smoker   . Smokeless tobacco: Not on file  . Alcohol Use: No    OB History    Grav Para Term Preterm Abortions TAB SAB Ect Mult Living   0               Review of Systems  Allergies  Bee venom; Latex; and Penicillins  Home Medications   Current Outpatient Rx  Name Route Sig Dispense Refill  . ACETAMINOPHEN 325 MG PO TABS Oral Take 650 mg by mouth once as needed. For pai     . CLOMIPHENE CITRATE 50 MG PO TABS Oral Take 50 mg by mouth daily.    Marygrace Drought WOMENS PO Oral Take 1 tablet by mouth daily.        BP 124/79  Pulse 64  Temp(Src) 98.1 F (36.7 C) (Oral)  Resp 18  Ht 5\' 6"  (1.676 m)  Wt 170 lb (77.111 kg)  BMI 27.44 kg/m2  LMP 10/23/2011  Physical Exam  Nursing note and vitals reviewed. Constitutional: She is oriented to person, place, and time. She appears well-developed and well-nourished.  Non-toxic appearance.  HENT:  Head: Normocephalic.  Right Ear: Tympanic membrane and external ear normal.  Left Ear: Tympanic membrane and external ear normal.  Eyes: EOM and lids are normal. Pupils are equal, round, and reactive to light.  Neck: Normal range of motion. Neck supple. Carotid bruit is not present.  Cardiovascular: Normal rate, regular rhythm, normal heart sounds, intact distal pulses and normal pulses.   No murmur heard. Pulmonary/Chest: Effort normal and breath sounds normal. No respiratory distress.  Abdominal: Soft. Bowel sounds are normal. There is no  tenderness. There is no guarding.  Musculoskeletal: Normal range of motion.  Lymphadenopathy:       Head (right side): No submandibular adenopathy present.       Head (left side): No submandibular adenopathy present.    She has no cervical adenopathy.  Neurological: She is alert and oriented to person, place, and time. She has normal strength. No cranial nerve deficit or sensory deficit. She exhibits normal muscle tone. Coordination normal.  Skin: Skin is warm and dry.  Psychiatric: She has a normal mood and affect. Her speech is normal.    ED Course  Procedures (including critical care time)  Labs Reviewed - No data to display No results found.   Dx: Headache   MDM  I have reviewed nursing notes, vital signs, and all appropriate lab and imaging results for this patient.  No gross neuro deficits at D/C. Pt to see Dr Neale Burly at [facility] for additonal followup.      Kathie Dike, [place] 10/28/11 2059

## 2011-10-26 NOTE — ED Notes (Addendum)
C/o diffuse HA x 5 days; reports temporary relief with tylenol; c/o nausea, denies vomiting.

## 2011-10-27 NOTE — ED Notes (Signed)
Instructions and prescriptions reviewed-verbalizes understanding.  A&ox4; in no distress.

## 2011-10-28 NOTE — ED Provider Notes (Signed)
Medical screening examination/treatment/procedure(s) were performed by non-physician practitioner and as supervising physician I was immediately available for consultation/collaboration.  Nicoletta Dress. Colon Branch, MD 10/28/11 2114

## 2011-11-05 ENCOUNTER — Emergency Department (HOSPITAL_COMMUNITY)
Admission: EM | Admit: 2011-11-05 | Discharge: 2011-11-05 | Disposition: A | Discharge status: Home / Self Care | Payer: BC Managed Care – PPO | Attending: Emergency Medicine | Admitting: Emergency Medicine

## 2011-11-05 ENCOUNTER — Encounter (HOSPITAL_COMMUNITY): Payer: Self-pay

## 2011-11-05 DIAGNOSIS — N760 Acute vaginitis: Secondary | ICD-10-CM | POA: Insufficient documentation

## 2011-11-05 DIAGNOSIS — A499 Bacterial infection, unspecified: Secondary | ICD-10-CM | POA: Insufficient documentation

## 2011-11-05 DIAGNOSIS — B9689 Other specified bacterial agents as the cause of diseases classified elsewhere: Secondary | ICD-10-CM | POA: Insufficient documentation

## 2011-11-05 LAB — URINALYSIS, ROUTINE W REFLEX MICROSCOPIC
Glucose, UA: NEGATIVE mg/dL
pH: 6 (ref 5.0–8.0)

## 2011-11-05 LAB — URINE MICROSCOPIC-ADD ON

## 2011-11-05 MED ORDER — CLINDAMYCIN HCL 150 MG PO CAPS: ORAL_CAPSULE | ORAL | Status: DC

## 2011-11-05 NOTE — ED Provider Notes (Signed)
History     CSN: [id number]  Arrival date & time 11/05/11  1647   First MD Initiated Contact with Patient 11/05/11 1715      Chief Complaint  Patient presents with  . Vaginal Itching    (Consider location/radiation/quality/duration/timing/severity/associated sxs/prior treatment) HPI Comments: Pt states the labia minora are red and itching.  She has no UTI sxs and no vaginal d/c.  LMP ~ 2 weeks ago.  No fever or chills.  GYN = dr. Emelda Fear.  She thinks she may have BV or trichomonas.  Patient is a 31 y.o. female presenting with vaginal itching. The history is provided by the patient. No language interpreter was used.  Vaginal Itching This is a new problem. The problem occurs constantly. The problem has been unchanged. Pertinent negatives include no fever. The symptoms are aggravated by nothing. She has tried nothing for the symptoms. The treatment provided no relief.    History reviewed. No pertinent past medical history.  History reviewed. No pertinent past surgical history.  History reviewed. No pertinent family history.  History  Substance Use Topics  . Smoking status: Never Smoker   . Smokeless tobacco: Not on file  . Alcohol Use: No    OB History    Grav Para Term Preterm Abortions TAB SAB Ect Mult Living   0               Review of Systems  Constitutional: Negative for fever.  Genitourinary: Negative for dysuria, urgency, frequency, hematuria, decreased urine volume, vaginal bleeding, menstrual problem and pelvic pain.  Musculoskeletal: Negative for back pain.  All other systems reviewed and are negative.    Allergies  Bee venom; Latex; and Penicillins  Home Medications   Current Outpatient Rx  Name Route Sig Dispense Refill  . ACETAMINOPHEN 325 MG PO TABS Oral Take 650 mg by mouth once as needed. For pai     . BUTALBITAL-APAP-CAFFEINE 50-325-40 MG PO TABS Oral Take 1-2 tablets by mouth every 6 (six) hours as needed for headache. 20 tablet 0  . CLOMIPHENE  CITRATE 50 MG PO TABS Oral Take 50 mg by mouth daily.    Marygrace Drought WOMENS PO Oral Take 1 tablet by mouth daily.        BP 120/75  Pulse 70  Temp(Src) 98.1 F (36.7 C) (Oral)  Resp 16  Ht 5\' 6"  (1.676 m)  Wt 175 lb (79.379 kg)  BMI 28.25 kg/m2  SpO2 100%  LMP 10/23/2011  Physical Exam  Nursing note and vitals reviewed. Constitutional: She is oriented to person, place, and time. She appears well-developed and well-nourished. No distress.  HENT:  Head: Normocephalic and atraumatic.  Eyes: EOM are normal.  Neck: Normal range of motion.  Cardiovascular: Normal rate, regular rhythm and normal heart sounds.   Pulmonary/Chest: Effort normal and breath sounds normal.  Abdominal: Soft. She exhibits no distension. There is no tenderness.  Genitourinary: Vaginal discharge found.  Musculoskeletal: Normal range of motion.  Neurological: She is alert and oriented to person, place, and time.  Skin: Skin is warm and dry.  Psychiatric: She has a normal mood and affect. Judgment normal.    ED Course  Procedures (including critical care time)  Labs Reviewed  URINALYSIS, ROUTINE W REFLEX MICROSCOPIC - Abnormal; Notable for the following:    Leukocytes, UA SMALL (*)    All other components within normal limits  URINE MICROSCOPIC-ADD ON - Abnormal; Notable for the following:    Squamous Epithelial / LPF MANY (*)  Bacteria, UA FEW (*)    All other components within normal limits  GC/CHLAMYDIA PROBE AMP, GENITAL  RPR   No results found.   No diagnosis found.    MDM          Worthy Rancher, PA 11/05/11 2008

## 2011-11-05 NOTE — ED Notes (Signed)
Complain of vag itching

## 2011-11-06 LAB — GC/CHLAMYDIA PROBE AMP, GENITAL
Chlamydia, DNA Probe: NEGATIVE
GC Probe Amp, Genital: NEGATIVE

## 2011-11-06 LAB — RPR: RPR Ser Ql: NONREACTIVE

## 2011-11-06 NOTE — ED Provider Notes (Signed)
Medical screening examination/treatment/procedure(s) were performed by non-physician practitioner and as supervising physician I was immediately available for consultation/collaboration.  Nicoletta Dress. Colon Branch, MD 11/06/11 1247

## 2012-05-14 ENCOUNTER — Emergency Department (HOSPITAL_COMMUNITY)
Admission: EM | Admit: 2012-05-14 | Discharge: 2012-05-14 | Disposition: A | Discharge status: Home / Self Care | Payer: Self-pay | Attending: Emergency Medicine | Admitting: Emergency Medicine

## 2012-05-14 ENCOUNTER — Encounter (HOSPITAL_COMMUNITY): Payer: Self-pay | Admitting: *Deleted

## 2012-05-14 DIAGNOSIS — N390 Urinary tract infection, site not specified: Secondary | ICD-10-CM | POA: Insufficient documentation

## 2012-05-14 LAB — URINALYSIS, ROUTINE W REFLEX MICROSCOPIC
Glucose, UA: 100 mg/dL — AB
pH: 5 (ref 5.0–8.0)

## 2012-05-14 LAB — WET PREP, GENITAL: Trich, Wet Prep: NONE SEEN

## 2012-05-14 LAB — PREGNANCY, URINE: Preg Test, Ur: NEGATIVE

## 2012-05-14 LAB — URINE MICROSCOPIC-ADD ON

## 2012-05-14 MED ORDER — NITROFURANTOIN MONOHYD MACRO 100 MG PO CAPS: 100.0000 mg | ORAL_CAPSULE | Freq: Two times a day (BID) | ORAL | Status: AC

## 2012-05-14 NOTE — ED Notes (Signed)
Pt c/o urinary frequency, lower back pain and burning on urination since this am. Pt states that she took AZO this morning.

## 2012-05-14 NOTE — ED Provider Notes (Signed)
History     CSN: [id number]  Arrival date & time 05/14/12  1540   First MD Initiated Contact with Patient 05/14/12 1609      Chief Complaint  Patient presents with  . Urinary Frequency    (Consider location/radiation/quality/duration/timing/severity/associated sxs/prior treatment) HPI Comments: Rachel Stevens is a 31 y.o. Female who has had low back pain, lower abdominal pain, urinary frequency, and dysuria since this morning. She reports mild vaginal discharge. Last menstrual period 05/02/12. No fever, chills, nausea, or vomiting. No weakness, or dizziness. No known aggravating or palliative factors.  The history is provided by the patient.    History reviewed. No pertinent past medical history.  History reviewed. No pertinent past surgical history.  History reviewed. No pertinent family history.  History  Substance Use Topics  . Smoking status: Never Smoker   . Smokeless tobacco: Not on file  . Alcohol Use: No    OB History    Grav Para Term Preterm Abortions TAB SAB Ect Mult Living   0               Review of Systems  All other systems reviewed and are negative.    Allergies  Bee venom; Latex; and Penicillins  Home Medications   Current Outpatient Rx  Name Route Sig Dispense Refill  . CLOMIPHENE CITRATE 50 MG PO TABS Oral Take 50 mg by mouth as directed. **take one tablet by mouth daily on the first 5 days of each consecutive month**    . ONE-A-DAY WOMENS PO Oral Take 1 tablet by mouth daily.      Marland Kitchen NITROFURANTOIN MONOHYD MACRO 100 MG PO CAPS Oral Take 1 capsule (100 mg total) by mouth 2 (two) times daily. X 7 days 14 capsule 0    BP 111/64  Pulse 71  Temp 98.2 F (36.8 C) (Oral)  Resp 18  Ht 5\' 6"  (1.676 m)  Wt 162 lb (73.483 kg)  BMI 26.15 kg/m2  SpO2 99%  LMP 05/02/2012  Physical Exam  Nursing note and vitals reviewed. Constitutional: She is oriented to person, place, and time. She appears well-developed and well-nourished.  HENT:  Head:  Normocephalic and atraumatic.  Eyes: Conjunctivae and EOM are normal. Pupils are equal, round, and reactive to light.  Neck: Normal range of motion and phonation normal. Neck supple.  Cardiovascular: Normal rate, regular rhythm and intact distal pulses.   Pulmonary/Chest: Effort normal and breath sounds normal. She exhibits no tenderness.  Abdominal: Soft. She exhibits no distension. There is no tenderness. There is no guarding.  Genitourinary:       Normal external femoral genitalia. Small amount of white vaginal discharge. No cervical motion tenderness or adnexal mass.  Musculoskeletal: Normal range of motion.  Neurological: She is alert and oriented to person, place, and time. She has normal strength. She exhibits normal muscle tone.  Skin: Skin is warm and dry.  Psychiatric: She has a normal mood and affect. Her behavior is normal. Judgment and thought content normal.    ED Course  Procedures (including critical care time)  Labs Reviewed  URINALYSIS, ROUTINE W REFLEX MICROSCOPIC - Abnormal; Notable for the following:    Color, Urine ORANGE (*)  BIOCHEMICALS MAY BE AFFECTED BY COLOR   APPearance HAZY (*)     Specific Gravity, Urine >1.030 (*)     Glucose, UA 100 (*)     Hgb urine dipstick TRACE (*)     Nitrite POSITIVE (*)     Leukocytes, UA SMALL (*)  All other components within normal limits  URINE MICROSCOPIC-ADD ON - Abnormal; Notable for the following:    Squamous Epithelial / LPF FEW (*)     Bacteria, UA FEW (*)     All other components within normal limits  WET PREP, GENITAL - Abnormal; Notable for the following:    Yeast Wet Prep HPF POC FEW (*)     WBC, Wet Prep HPF POC FEW (*)     All other components within normal limits  PREGNANCY, URINE  URINE CULTURE  GC/CHLAMYDIA PROBE AMP, GENITAL      1. UTI (lower urinary tract infection)       MDM  Signs/Sx of UTI. Eval. C/w cystitis. Doubt metabolic instability, serious bacterial infection or impending  vascular collapse; the patient is stable for discharge.   Plan: Home Medications- macrobid; Home Treatments- fluids, Tylenol; Recommended follow up- PCP prn        Flint Melter, MD 05/14/12 [phone]

## 2012-05-15 LAB — URINE CULTURE

## 2012-05-16 LAB — GC/CHLAMYDIA PROBE AMP, GENITAL: GC Probe Amp, Genital: NEGATIVE

## 2012-08-16 ENCOUNTER — Emergency Department (HOSPITAL_COMMUNITY)
Admission: EM | Admit: 2012-08-16 | Discharge: 2012-08-16 | Disposition: A | Discharge status: Home / Self Care | Payer: Self-pay | Attending: Emergency Medicine | Admitting: Emergency Medicine

## 2012-08-16 ENCOUNTER — Encounter (HOSPITAL_COMMUNITY): Payer: Self-pay | Admitting: *Deleted

## 2012-08-16 DIAGNOSIS — S7012XA Contusion of left thigh, initial encounter: Secondary | ICD-10-CM

## 2012-08-16 DIAGNOSIS — S0003XA Contusion of scalp, initial encounter: Secondary | ICD-10-CM

## 2012-08-16 DIAGNOSIS — S7010XA Contusion of unspecified thigh, initial encounter: Secondary | ICD-10-CM | POA: Insufficient documentation

## 2012-08-16 DIAGNOSIS — S0083XA Contusion of other part of head, initial encounter: Secondary | ICD-10-CM | POA: Insufficient documentation

## 2012-08-16 DIAGNOSIS — S161XXA Strain of muscle, fascia and tendon at neck level, initial encounter: Secondary | ICD-10-CM

## 2012-08-16 DIAGNOSIS — S139XXA Sprain of joints and ligaments of unspecified parts of neck, initial encounter: Secondary | ICD-10-CM | POA: Insufficient documentation

## 2012-08-16 MED ORDER — IBUPROFEN 800 MG PO TABS
800.0000 mg | ORAL_TABLET | Freq: Once | ORAL | Status: AC
  Administered 2012-08-16: 800 mg via ORAL
  Filled 2012-08-16: qty 1
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
## 2012-08-16 NOTE — ED Notes (Signed)
EDPa to room for initial assessment.
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
## 2012-08-16 NOTE — ED Provider Notes (Signed)
Medical screening examination/treatment/procedure(s) were performed by non-physician practitioner and as supervising physician I was immediately available for consultation/collaboration.   Jayleene Glaeser, MD 08/16/12 2020 

## 2012-09-23 ENCOUNTER — Encounter (HOSPITAL_COMMUNITY): Payer: Self-pay | Admitting: *Deleted

## 2012-09-23 ENCOUNTER — Emergency Department (HOSPITAL_COMMUNITY)
Admission: EM | Admit: 2012-09-23 | Discharge: 2012-09-23 | Disposition: A | Discharge status: Home / Self Care | Payer: Self-pay | Attending: Emergency Medicine | Admitting: Emergency Medicine

## 2012-09-23 DIAGNOSIS — R509 Fever, unspecified: Secondary | ICD-10-CM | POA: Insufficient documentation

## 2012-09-23 DIAGNOSIS — R51 Headache: Secondary | ICD-10-CM | POA: Insufficient documentation

## 2012-09-23 DIAGNOSIS — R112 Nausea with vomiting, unspecified: Secondary | ICD-10-CM | POA: Insufficient documentation

## 2012-09-23 DIAGNOSIS — J111 Influenza due to unidentified influenza virus with other respiratory manifestations: Secondary | ICD-10-CM | POA: Insufficient documentation

## 2012-09-23 MED ORDER — SODIUM CHLORIDE 0.9 % IV BOLUS (SEPSIS)
1000.0000 mL | Freq: Once | INTRAVENOUS | Status: AC
  Administered 2012-09-23: 1000 mL via INTRAVENOUS

## 2012-09-23 MED ORDER — ONDANSETRON HCL 4 MG/2ML IJ SOLN
4.0000 mg | Freq: Once | INTRAMUSCULAR | Status: AC
Start: 2012-09-23 — End: 2012-09-23
  Administered 2012-09-23: 4 mg via INTRAVENOUS
  Filled 2012-09-23: qty 2

## 2012-09-23 MED ORDER — PROMETHAZINE HCL 25 MG PO TABS: 25.0000 mg | ORAL_TABLET | Freq: Four times a day (QID) | ORAL | Status: DC | PRN

## 2012-09-23 MED ORDER — ACETAMINOPHEN 325 MG PO TABS
650.0000 mg | ORAL_TABLET | Freq: Once | ORAL | Status: AC
  Administered 2012-09-23: 650 mg via ORAL
  Filled 2012-09-23: qty 2

## 2012-09-23 MED ORDER — IBUPROFEN 800 MG PO TABS
800.0000 mg | ORAL_TABLET | Freq: Once | ORAL | Status: AC
  Administered 2012-09-23: 800 mg via ORAL
  Filled 2012-09-23: qty 1

## 2012-09-23 MED ORDER — OSELTAMIVIR PHOSPHATE 75 MG PO CAPS: 75.0000 mg | ORAL_CAPSULE | Freq: Two times a day (BID) | ORAL | Status: DC

## 2012-09-23 NOTE — ED Notes (Cosign Needed Addendum)
Pt c/o chills, cough, fever, headache, and n/v x 1 day.

## 2012-09-24 NOTE — ED Provider Notes (Signed)
History     CSN: [id number]  Arrival date & time 09/23/12  1909   First MD Initiated Contact with Patient 09/23/12 2055      Chief Complaint  Patient presents with  . Chills  . Headache  . Fever  . Nausea  . Emesis  . Cough    (Consider location/radiation/quality/duration/timing/severity/associated sxs/prior treatment) Patient is a 31 y.o. female presenting with headaches. The history is provided by the patient (the pt complains of fever and aches).  Headache  This is a new problem. The current episode started 12 to 24 hours ago. The problem occurs constantly. The problem has not changed since onset.The headache is associated with nothing. The pain is located in the left unilateral region. The quality of the pain is described as dull. The pain is at a severity of 2/10.    History reviewed. No pertinent past medical history.  History reviewed. No pertinent past surgical history.  No family history on file.  History  Substance Use Topics  . Smoking status: Never Smoker   . Smokeless tobacco: Not on file  . Alcohol Use: No    OB History    Grav Para Term Preterm Abortions TAB SAB Ect Mult Living   0               Review of Systems  Constitutional: Negative for fatigue.  HENT: Negative for congestion, sinus pressure and ear discharge.   Eyes: Negative for discharge.  Respiratory: Negative for cough.   Cardiovascular: Negative for chest pain.  Gastrointestinal: Negative for abdominal pain and diarrhea.  Genitourinary: Negative for frequency and hematuria.  Musculoskeletal: Negative for back pain.  Skin: Negative for rash.  Neurological: Negative for seizures and headaches.  Hematological: Negative.   Psychiatric/Behavioral: Negative for hallucinations.    Allergies  Bee venom; Latex; and Penicillins  Home Medications   Current Outpatient Rx  Name  Route  Sig  Dispense  Refill  . ONE-A-DAY WOMENS PO   Oral   Take 1 tablet by mouth daily.           .  OSELTAMIVIR PHOSPHATE 75 MG PO CAPS   Oral   Take 1 capsule (75 mg total) by mouth every 12 (twelve) hours.   10 capsule   0   . PROMETHAZINE HCL 25 MG PO TABS   Oral   Take 1 tablet (25 mg total) by mouth every 6 (six) hours as needed for nausea.   15 tablet   0     BP 125/79  Pulse 120  Temp 101.9 F (38.8 C) (Oral)  Resp 20  Ht 5\' 6"  (1.676 m)  Wt 178 lb (80.74 kg)  BMI 28.73 kg/m2  SpO2 98%  LMP 08/24/2012  Physical Exam  Constitutional: She is oriented to person, place, and time. She appears well-developed.  HENT:  Head: Normocephalic and atraumatic.  Eyes: Conjunctivae normal and EOM are normal. No scleral icterus.  Neck: Neck supple. No thyromegaly present.  Cardiovascular: Normal rate and regular rhythm.  Exam reveals no gallop and no friction rub.   No murmur heard. Pulmonary/Chest: No stridor. She has no wheezes. She has no rales. She exhibits no tenderness.  Abdominal: She exhibits no distension. There is no tenderness. There is no rebound.  Musculoskeletal: Normal range of motion. She exhibits no edema.  Lymphadenopathy:    She has no cervical adenopathy.  Neurological: She is oriented to person, place, and time. Coordination normal.  Skin: No rash noted. No erythema.  Psychiatric: She has a normal mood and affect. Her behavior is normal.    ED Course  Procedures (including critical care time)  Labs Reviewed - No data to display No results found.   1. Influenza       MDM          Benny Lennert, MD 09/24/12 1239

## 2012-10-21 ENCOUNTER — Other Ambulatory Visit (HOSPITAL_COMMUNITY): Payer: Self-pay | Admitting: Nurse Practitioner

## 2012-10-21 DIAGNOSIS — IMO0002 Reserved for concepts with insufficient information to code with codable children: Secondary | ICD-10-CM

## 2012-10-26 ENCOUNTER — Ambulatory Visit (HOSPITAL_COMMUNITY)
Admission: RE | Admit: 2012-10-26 | Discharge: 2012-10-26 | Disposition: A | Discharge status: Home / Self Care | Payer: PRIVATE HEALTH INSURANCE | Source: Ambulatory Visit

## 2012-10-26 DIAGNOSIS — N63 Unspecified lump in unspecified breast: Secondary | ICD-10-CM | POA: Insufficient documentation

## 2012-10-26 DIAGNOSIS — IMO0002 Reserved for concepts with insufficient information to code with codable children: Secondary | ICD-10-CM

## 2012-11-03 NOTE — H&P (Signed)
  NTS SOAP Note  Vital Signs:  Vitals as of: 11/03/2012: Systolic 125: Diastolic 75: Heart Rate 74: Temp 97.55F: Height 46ft 6in: Weight 164Lbs 0 Ounces: BMI 26  BMI : 26.47 kg/m2  Subjective: This 21 Years 58 Months old Female presents for a right breast lump.  Is palpable by patient, along superior portion of the right areola.  Has hardened recently.  No family h/o breast cancer, nipple discharge.  s/p reduction mammoplasty in 2009.  Core biopsy recently showed fat necrosis.   Review of Symptoms:  Constitutional:unremarkable   Head:unremarkable    Eyes:unremarkable   Nose/Mouth/Throat:unremarkable Cardiovascular:  unremarkable   Respiratory:unremarkable   Gastrointestinal:  unremarkable   Genitourinary:unremarkable     Musculoskeletal:unremarkable   Skin:unremarkable   as above Hematolgic/Lymphatic:unremarkable     Allergic/Immunologic:unremarkable     Past Medical History:    Reviewed   Past Medical History  Surgical History: right breast mammoplasty reduction, 2009 Allergies: pcn, latex Medications: none   Social History:Reviewed  Social History  Preferred Language: English Race:  Black or African American Ethnicity: Not Hispanic / Latino Age: 32 Years 8 Months Marital Status:  M Alcohol:  No Recreational drug(s):  No   Smoking Status: Never smoker reviewed on 11/03/2012 Functional Status reviewed on mm/dd/yyyy ------------------------------------------------ Bathing: Normal Cooking: Normal Dressing: Normal Driving: Normal Eating: Normal Managing Meds: Normal Oral Care: Normal Shopping: Normal Toileting: Normal Transferring: Normal Walking: Normal Cognitive Status reviewed on mm/dd/yyyy ------------------------------------------------ Attention: Normal Decision Making: Normal Language: Normal Memory: Normal Motor: Normal Perception: Normal Problem Solving: Normal Visual and Spatial: Normal   Family  History:  Reviewed   Family History  Is there a family history of:no immediate family h/o breast cancer    Objective Information: General:  Well appearing, well nourished in no distress. Heart:  RRR, no murmur Lungs:    CTA bilaterally, no wheezes, rhonchi, rales.  Breathing unlabored.     Hard, tender mass, 2.5cm along superior aspect of the right areola.  Inferior scar  noted along fold.  Axilla negative.  Left breast unremarkable.  Assessment:Right breast lump  Diagnosis &amp; Procedure:    Plan:Scheduled for right breast biopsy on 11/14/12.   Patient Education:Alternative treatments to surgery were discussed with patient (and family).  Risks and benefits  of procedure were fully explained to the patient (and family) who gave informed consent. Patient/family questions were addressed.  Follow-up:Pending Surgery

## 2012-11-07 ENCOUNTER — Emergency Department: Payer: Self-pay | Admitting: Emergency Medicine

## 2012-11-07 ENCOUNTER — Emergency Department (HOSPITAL_COMMUNITY)
Admission: EM | Admit: 2012-11-07 | Discharge: 2012-11-07 | Disposition: A | Discharge status: Home / Self Care | Payer: Self-pay | Attending: Emergency Medicine | Admitting: Emergency Medicine

## 2012-11-07 ENCOUNTER — Encounter (HOSPITAL_COMMUNITY): Payer: Self-pay | Admitting: *Deleted

## 2012-11-07 DIAGNOSIS — N949 Unspecified condition associated with female genital organs and menstrual cycle: Secondary | ICD-10-CM | POA: Insufficient documentation

## 2012-11-07 DIAGNOSIS — Z3202 Encounter for pregnancy test, result negative: Secondary | ICD-10-CM | POA: Insufficient documentation

## 2012-11-07 DIAGNOSIS — R109 Unspecified abdominal pain: Secondary | ICD-10-CM | POA: Insufficient documentation

## 2012-11-07 LAB — COMPREHENSIVE METABOLIC PANEL
Alkaline Phosphatase: 92 U/L (ref 50–136)
BUN: 8 mg/dL (ref 7–18)
Calcium, Total: 8.7 mg/dL (ref 8.5–10.1)
Co2: 28 mmol/L (ref 21–32)
Creatinine: 0.74 mg/dL (ref 0.60–1.30)
EGFR (African American): >60
EGFR (Non-African Amer.): >60
Glucose: 92 mg/dL (ref 65–99)
Osmolality: 277 (ref 275–301)
Potassium: 3.4 mmol/L — ABNORMAL LOW (ref 3.5–5.1)
SGOT(AST): 20 U/L (ref 15–37)
SGPT (ALT): 21 U/L (ref 12–78)
Sodium: 140 mmol/L (ref 136–145)

## 2012-11-07 LAB — URINALYSIS, COMPLETE
Bacteria: NONE SEEN
Bilirubin,UR: NEGATIVE
Blood: NEGATIVE
Ketone: NEGATIVE
Nitrite: NEGATIVE
Specific Gravity: 1.025 (ref 1.003–1.030)
Squamous Epithelial: 5
WBC UR: 2 /HPF (ref 0–5)

## 2012-11-07 LAB — URINALYSIS, ROUTINE W REFLEX MICROSCOPIC
Bilirubin Urine: NEGATIVE
Glucose, UA: NEGATIVE mg/dL
Ketones, ur: NEGATIVE mg/dL
Nitrite: NEGATIVE
pH: 6 (ref 5.0–8.0)

## 2012-11-07 LAB — CBC
HCT: 34.6 % — ABNORMAL LOW (ref 35.0–47.0)
HGB: 11.8 g/dL — ABNORMAL LOW (ref 12.0–16.0)
MCH: 29.1 pg (ref 26.0–34.0)
MCHC: 34 g/dL (ref 32.0–36.0)
MCV: 86 fL (ref 80–100)
Platelet: 266 10*3/uL (ref 150–440)
RBC: 4.05 10*6/uL (ref 3.80–5.20)
WBC: 9.9 10*3/uL (ref 3.6–11.0)

## 2012-11-07 LAB — WET PREP, GENITAL
Clue Cells Wet Prep HPF POC: NONE SEEN
WBC, Wet Prep HPF POC: NONE SEEN
Yeast Wet Prep HPF POC: NONE SEEN

## 2012-11-07 NOTE — ED Notes (Signed)
Pt c/o lower abdominal pain and vaginal burning x 2 days.

## 2012-11-07 NOTE — ED Provider Notes (Signed)
History     CSN: [id number]  Arrival date & time 11/07/12  0259   First MD Initiated Contact with Patient 11/07/12 [phone]      Chief Complaint  Patient presents with  . Abdominal Pain  . Vaginal Pain    (Consider location/radiation/quality/duration/timing/severity/associated sxs/prior treatment) HPI Comments: Rachel Stevens is a 32 y.o. Female who is here for evaluation of a burning discomfort in her vagina, and lower abdominal pain for 2 days. There is no radiation of the discomfort. The pain is mild, and started insidiously. There is no dysuria, urinary frequency, abnormal vaginal bleeding, back, pain, weakness, or dizziness. She denies fever, or chills. Last menstrual period 10/23/2012.  She's not had this problem previously. She's not tried a medication for the problem.  Patient is a 32 y.o. female presenting with abdominal pain and vaginal pain. The history is provided by the patient.  Abdominal Pain Vaginal Pain Associated symptoms include abdominal pain.    History reviewed. No pertinent past medical history.  History reviewed. No pertinent past surgical history.  History reviewed. No pertinent family history.  History  Substance Use Topics  . Smoking status: Never Smoker   . Smokeless tobacco: Not on file  . Alcohol Use: No    OB History   Grav Para Term Preterm Abortions TAB SAB Ect Mult Living   0               Review of Systems  Gastrointestinal: Positive for abdominal pain.  Genitourinary: Positive for vaginal pain.  All other systems reviewed and are negative.    Allergies  Bee venom; Latex; and Penicillins  Home Medications   Current Outpatient Rx  Name  Route  Sig  Dispense  Refill  . Multiple Vitamins-Calcium (ONE-A-DAY WOMENS PO)   Oral   Take 1 tablet by mouth daily.             BP 129/78  Pulse 78  Temp(Src) 98 F (36.7 C) (Oral)  Resp 20  Ht 5\' 6"  (1.676 m)  Wt 168 lb (76.204 kg)  BMI 27.13 kg/m2  SpO2 98%  LMP  10/23/2012  Physical Exam  Nursing note and vitals reviewed. Constitutional: She is oriented to person, place, and time. She appears well-developed and well-nourished.  HENT:  Head: Normocephalic and atraumatic.  Eyes: Conjunctivae and EOM are normal. Pupils are equal, round, and reactive to light.  Neck: Normal range of motion and phonation normal. Neck supple.  Cardiovascular: Normal rate, regular rhythm and intact distal pulses.   Pulmonary/Chest: Effort normal and breath sounds normal. She exhibits no tenderness.  Abdominal: Soft. She exhibits no distension and no mass. There is tenderness (Mild lower abdominal tenderness, bilaterally). There is no rebound and no guarding.  Genitourinary:  Normal external female genitalia. Small amount of white vaginal discharge, nonspecific. Cervix is closed. No cervical motion tenderness. There is mild tenderness in the midline on bimanual palpation, but no uterine or ovarian enlargement.  Musculoskeletal: Normal range of motion.  Neurological: She is alert and oriented to person, place, and time. She has normal strength. She exhibits normal muscle tone.  Skin: Skin is warm and dry.  Psychiatric: She has a normal mood and affect. Her behavior is normal. Judgment and thought content normal.    ED Course  Procedures (including critical care time)   Reevaluation: 04:40- patient now states she is concerned that she has an STD. I informed her that the test is pending.  Nursing notes, applicable records and vitals  reviewed.  Radiologic Images/Reports reviewed.   Labs Reviewed  WET PREP, GENITAL  GC/CHLAMYDIA PROBE AMP  URINALYSIS, ROUTINE W REFLEX MICROSCOPIC  PREGNANCY, URINE      1. Abdominal pain of unknown etiology       MDM  Nonspecific symptoms with benign examination. Wet prep, and urinalysis, are normal. Screening for STD is pending. Doubt metabolic instability, serious bacterial infection or impending vascular collapse; the  patient is stable for discharge.     Plan: Home Medications- Tylenol prn; Home Treatments- rest; Recommended follow up- PCP prn    Flint Melter, MD 11/07/12 [phone]

## 2012-11-08 LAB — GC/CHLAMYDIA PROBE AMP: GC Probe RNA: NEGATIVE

## 2012-11-08 NOTE — Patient Instructions (Signed)
Rachel Stevens  11/08/2012   Your procedure is scheduled on:  11/14/12  Report to [facility] at 0740 AM.  Call this number if you have problems the morning of surgery: [phone]   Remember:   Do not eat food or drink liquids after midnight.   Take these medicines the morning of surgery with A SIP OF WATER: none   Do not wear jewelry, make-up or nail polish.  Do not wear lotions, powders, or perfumes. You may wear deodorant.  Do not shave 48 hours prior to surgery. Men may shave face and neck.  Do not bring valuables to the hospital.  Contacts, dentures or bridgework may not be worn into surgery.  Leave suitcase in the car. After surgery it may be brought to your room.  For patients admitted to the hospital, checkout time is 11:00 AM the day of  discharge.   Patients discharged the day of surgery will not be allowed to drive  home.  Name and phone number of your driver: family  Special Instructions: Shower using CHG 2 nights before surgery and the night before surgery.  If you shower the day of surgery use CHG.  Use special wash - you have one bottle of CHG for all showers.  You should use approximately 1/3 of the bottle for each shower.   Please read over the following fact sheets that you were given: Pain Booklet, MRSA Information, Surgical Site Infection Prevention, Anesthesia Post-op Instructions and Care and Recovery After Surgery   PATIENT INSTRUCTIONS POST-ANESTHESIA  IMMEDIATELY FOLLOWING SURGERY:  Do not drive or operate machinery for the first twenty four hours after surgery.  Do not make any important decisions for twenty four hours after surgery or while taking narcotic pain medications or sedatives.  If you develop intractable nausea and vomiting or a severe headache please notify your doctor immediately.  FOLLOW-UP:  Please make an appointment with your surgeon as instructed. You do not need to follow up with anesthesia unless specifically instructed to do so.  WOUND  CARE INSTRUCTIONS (if applicable):  Keep a dry clean dressing on the anesthesia/puncture wound site if there is drainage.  Once the wound has quit draining you may leave it open to air.  Generally you should leave the bandage intact for twenty four hours unless there is drainage.  If the epidural site drains for more than 36-48 hours please call the anesthesia department.  QUESTIONS?:  Please feel free to call your physician or the hospital operator if you have any questions, and they will be happy to assist you.      Breast Biopsy A breast biopsy is a procedure where a sample of breast tissue is removed from your breast. The tissue is examined under a microscope to see if cancerous cells are present. A breast biopsy is done when there is:  Any undiagnosed breast mass (tumor).  Nipple abnormalities, dimpling, crusting, or ulcerations.  Abnormal discharge from the nipple, especially blood.  Redness, swelling, and pain of the breast.  Calcium deposits (calcifications) or abnormalities seen on a mammogram, ultrasound result, or results of magnetic resonance imaging (MRI).  Suspicious changes in the breast seen on your mammogram. If the tumor is found to be cancerous (malignant), a breast biopsy can help to determine what the best treatment is for you. There are many different types of breast biopsies. Talk to your caregiver about your options and which type is best for you. LET YOUR CAREGIVER KNOW ABOUT:  Allergies to food  or medicine.  Medicines taken, including vitamins, herbs, eyedrops, over-the-counter medicines, and creams.  Use of steroids (by mouth or creams).  Previous problems with anesthetics or numbing medicines.  History of bleeding problems or blood clots.  Previous surgery.  Other health problems, including diabetes and kidney problems.  Any recent colds or infections.  Possibility of pregnancy, if this applies. RISKS AND COMPLICATIONS    Bleeding.  Infection.  Allergy to medicines.  Bruising and swelling of the breast.  Alteration in the shape of the breast.  Not finding the lump or abnormality.  Needing more surgery. BEFORE THE PROCEDURE  Arrange for someone to drive you home after the procedure.  Do not smoke for 2 weeks before the procedure. Stop smoking, if you smoke.  Do not drink alcohol for 24 hours before procedure.  Wear a good support bra to the procedure. PROCEDURE  You may be given a medicine to numb the breast area (local anesthesia) or a medicine to make you sleep (general anesthesia) during the procedure. The following are the different types of biopsies that can be performed.   Fine-needle aspiration A thin needle is attached to a syringe and inserted into the breast lump. Fluid and cells are removed and then looked at under a microscope. If the breast lump cannot be felt, an ultrasound may be used to help locate the lump and place the needle in the correct area.   Core needle biopsy A wide, hollow needle (core needle) is inserted into the breast lump 3 6 times to get tissue samples or cores. The samples are removed. The needle is usually placed in the correct area by using an ultrasound or X-ray.   Stereotactic biopsy X-ray equipment and a computer are used to analyze X-ray pictures of the breast lump. The computer then finds exactly where the core needle needs to be inserted. Tissue samples are removed.   Vacuum-assisted biopsy A small incision (less than  inch) is made in your breast. A biopsy device that includes a hollow needle and vacuum is passed through the incision and into the breast tissue. The vacuum gently draws abnormal breast tissue into the needle to remove it. This type of biopsy removes a larger tissue sample than a regular core needle biopsy. No stitches are needed, and there is usually little scarring.  Ultrasound-guided core needle biopsy A high frequency ultrasound helps  guide the core needle to the area of the mass or abnormality. An incision is made to insert the needle. Tissue samples are removed.  Open biopsy A larger incision is made in the breast. Your caregiver will attempt to remove the whole breast lump or as much as possible. AFTER THE PROCEDURE  You will be taken to the recovery area. If you are doing well and have no problems, you will be allowed to go home.  You may notice bruising on your breast. This is normal.  Your caregiver may apply a pressure dressing on your breast for 24 48 hours. A pressure dressing is a bandage that is wrapped tightly around the chest to stop fluid from collecting underneath tissues. Document Released: 09/14/2005 Document Revised: 03/15/2012 Document Reviewed: 10/15/2011 [facility] Patient Information 2013 [place], [place].

## 2012-11-09 ENCOUNTER — Encounter (HOSPITAL_COMMUNITY): Payer: Self-pay | Admitting: Pharmacy Technician

## 2012-11-09 ENCOUNTER — Encounter (HOSPITAL_COMMUNITY)
Admission: RE | Admit: 2012-11-09 | Discharge: 2012-11-09 | Disposition: A | Discharge status: Home / Self Care | Payer: Self-pay | Source: Ambulatory Visit | Attending: General Surgery | Admitting: General Surgery

## 2012-11-09 ENCOUNTER — Encounter (HOSPITAL_COMMUNITY): Payer: Self-pay

## 2012-11-09 DIAGNOSIS — Z5320 Procedure and treatment not carried out because of patient's decision for unspecified reasons: Secondary | ICD-10-CM | POA: Insufficient documentation

## 2012-11-09 DIAGNOSIS — N63 Unspecified lump in unspecified breast: Secondary | ICD-10-CM | POA: Insufficient documentation

## 2012-11-09 LAB — CBC WITH DIFFERENTIAL/PLATELET
Basophils Absolute: 0 10*3/uL (ref 0.0–0.1)
Eosinophils Absolute: 0.1 10*3/uL (ref 0.0–0.7)
Eosinophils Relative: 1 % (ref 0–5)
MCH: 28.9 pg (ref 26.0–34.0)
MCV: 85.8 fL (ref 78.0–100.0)
Platelets: 275 10*3/uL (ref 150–400)
RDW: 12.8 % (ref 11.5–15.5)

## 2012-11-09 LAB — BASIC METABOLIC PANEL
BUN: 8 mg/dL (ref 6–23)
CO2: 27 mEq/L (ref 19–32)
Calcium: 9.1 mg/dL (ref 8.4–10.5)
Creatinine, Ser: 0.79 mg/dL (ref 0.50–1.10)
Glucose, Bld: 91 mg/dL (ref 70–99)

## 2012-11-14 ENCOUNTER — Encounter (HOSPITAL_COMMUNITY): Admission: RE | Payer: Self-pay | Source: Ambulatory Visit

## 2012-11-14 ENCOUNTER — Ambulatory Visit (HOSPITAL_COMMUNITY): Admission: RE | Admit: 2012-11-14 | Payer: Self-pay | Source: Ambulatory Visit | Admitting: General Surgery

## 2012-11-14 SURGERY — BREAST BIOPSY
Anesthesia: General | Laterality: Right

## 2013-01-23 ENCOUNTER — Emergency Department: Payer: Self-pay | Admitting: Emergency Medicine

## 2013-01-23 LAB — URINALYSIS, COMPLETE
Bacteria: NONE SEEN
Blood: NEGATIVE
Glucose,UR: NEGATIVE mg/dL (ref 0–75)
RBC,UR: <1 /HPF (ref 0–5)

## 2013-01-23 LAB — PREGNANCY, URINE: Pregnancy Test, Urine: NEGATIVE m[IU]/mL

## 2013-05-24 ENCOUNTER — Emergency Department: Payer: Self-pay | Admitting: Emergency Medicine

## 2013-05-24 LAB — COMPREHENSIVE METABOLIC PANEL WITH GFR
Albumin: 3.5 g/dL
Alkaline Phosphatase: 89 U/L
Anion Gap: 7
BUN: 8 mg/dL
Bilirubin,Total: 0.2 mg/dL
Calcium, Total: 8.6 mg/dL
Chloride: 106 mmol/L
Co2: 26 mmol/L
Creatinine: 0.85 mg/dL
EGFR (African American): >60
EGFR (Non-African Amer.): >60
Glucose: 129 mg/dL — ABNORMAL HIGH
Osmolality: 278
Potassium: 3.4 mmol/L — ABNORMAL LOW
SGOT(AST): 16 U/L
SGPT (ALT): 17 U/L
Sodium: 139 mmol/L
Total Protein: 7.2 g/dL

## 2013-05-24 LAB — URINALYSIS, COMPLETE
Bacteria: NONE SEEN
Bilirubin,UR: NEGATIVE
Blood: NEGATIVE
Glucose,UR: NEGATIVE mg/dL
Ketone: NEGATIVE
Nitrite: NEGATIVE
Ph: 6
Protein: NEGATIVE
RBC,UR: 1 /HPF
Specific Gravity: 1.02
Squamous Epithelial: 6
WBC UR: 2 /HPF

## 2013-05-24 LAB — CBC
HCT: 35.6 % (ref 35.0–47.0)
MCH: 28.7 pg (ref 26.0–34.0)
MCV: 86 fL (ref 80–100)
Platelet: 244 10*3/uL (ref 150–440)
RBC: 4.14 10*6/uL (ref 3.80–5.20)
RDW: 13.3 % (ref 11.5–14.5)
WBC: 10.5 10*3/uL (ref 3.6–11.0)

## 2013-07-16 ENCOUNTER — Emergency Department: Payer: Self-pay | Admitting: Emergency Medicine

## 2013-07-17 LAB — URINALYSIS, COMPLETE
Bilirubin,UR: NEGATIVE
Blood: NEGATIVE
Ketone: NEGATIVE
Leukocyte Esterase: NEGATIVE
Ph: 6 (ref 4.5–8.0)
RBC,UR: 1 /HPF (ref 0–5)
Squamous Epithelial: 13
WBC UR: 1 /HPF (ref 0–5)

## 2013-07-17 LAB — COMPREHENSIVE METABOLIC PANEL
Albumin: 3.8 g/dL (ref 3.4–5.0)
BUN: 8 mg/dL (ref 7–18)
Chloride: 108 mmol/L — ABNORMAL HIGH (ref 98–107)
Co2: 29 mmol/L (ref 21–32)
EGFR (African American): >60
EGFR (Non-African Amer.): >60
Glucose: 89 mg/dL (ref 65–99)
Sodium: 140 mmol/L (ref 136–145)
Total Protein: 7.8 g/dL (ref 6.4–8.2)

## 2013-07-17 LAB — CBC
HGB: 12.4 g/dL (ref 12.0–16.0)
MCH: 28.8 pg (ref 26.0–34.0)
MCV: 86 fL (ref 80–100)
Platelet: 272 10*3/uL (ref 150–440)
RBC: 4.3 10*6/uL (ref 3.80–5.20)

## 2013-07-17 LAB — LIPASE, BLOOD: Lipase: 107 U/L (ref 73–393)

## 2013-08-08 ENCOUNTER — Emergency Department: Payer: Self-pay | Admitting: Emergency Medicine

## 2013-08-12 LAB — BETA STREP CULTURE(ARMC)

## 2013-08-22 ENCOUNTER — Emergency Department: Payer: Self-pay | Admitting: Emergency Medicine

## 2013-09-12 ENCOUNTER — Other Ambulatory Visit: Payer: Self-pay | Admitting: Obstetrics & Gynecology

## 2013-09-12 DIAGNOSIS — O3680X0 Pregnancy with inconclusive fetal viability, not applicable or unspecified: Secondary | ICD-10-CM

## 2013-09-13 ENCOUNTER — Encounter: Payer: Self-pay | Admitting: *Deleted

## 2013-09-13 ENCOUNTER — Other Ambulatory Visit: Payer: Self-pay

## 2013-11-30 ENCOUNTER — Emergency Department (HOSPITAL_COMMUNITY): Payer: Self-pay

## 2013-11-30 ENCOUNTER — Encounter (HOSPITAL_COMMUNITY): Payer: Self-pay | Admitting: Emergency Medicine

## 2013-11-30 ENCOUNTER — Emergency Department (HOSPITAL_COMMUNITY)
Admission: EM | Admit: 2013-11-30 | Discharge: 2013-11-30 | Disposition: A | Discharge status: Home / Self Care | Payer: Self-pay | Attending: Emergency Medicine | Admitting: Emergency Medicine

## 2013-11-30 DIAGNOSIS — M25462 Effusion, left knee: Secondary | ICD-10-CM

## 2013-11-30 DIAGNOSIS — S99929A Unspecified injury of unspecified foot, initial encounter: Principal | ICD-10-CM

## 2013-11-30 DIAGNOSIS — S8990XA Unspecified injury of unspecified lower leg, initial encounter: Secondary | ICD-10-CM | POA: Insufficient documentation

## 2013-11-30 DIAGNOSIS — S99919A Unspecified injury of unspecified ankle, initial encounter: Principal | ICD-10-CM

## 2013-11-30 DIAGNOSIS — Z880 Allergy status to penicillin: Secondary | ICD-10-CM | POA: Insufficient documentation

## 2013-11-30 DIAGNOSIS — Z91040 Latex allergy status: Secondary | ICD-10-CM | POA: Insufficient documentation

## 2013-11-30 DIAGNOSIS — Z79899 Other long term (current) drug therapy: Secondary | ICD-10-CM | POA: Insufficient documentation

## 2013-11-30 MED ORDER — IBUPROFEN 800 MG PO TABS
800.0000 mg | ORAL_TABLET | Freq: Once | ORAL | Status: AC
  Administered 2013-11-30: 800 mg via ORAL
  Filled 2013-11-30: qty 1

## 2013-11-30 MED ORDER — IBUPROFEN 600 MG PO TABS: 600.0000 mg | ORAL_TABLET | Freq: Four times a day (QID) | ORAL | Status: DC | PRN

## 2013-11-30 NOTE — ED Notes (Signed)
nad noted prior to dc. Dc instructions reviewed and explained. Voiced understanding. 1 script given and f/u appt discussed for pt to arrange.
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
## 2013-11-30 NOTE — ED Notes (Signed)
Ace wrap and crutches performed by J. Wicker NT

## 2013-11-30 NOTE — Discharge Instructions (Signed)
Knee Effusion  Knee effusion means you have fluid in your knee. The knee may be more difficult to bend and move. HOME CARE  Use crutches or a brace as told by your doctor.  Put ice on the injured area.  Put ice in a plastic bag.  Place a towel between your skin and the bag.  Leave the ice on for 15-20 minutes, 03-04 times a day.  Raise (elevate) your knee as much as possible.  Only take medicine as told by your doctor.  You may need to do strengthening exercises. Ask your doctor.  Continue with your normal diet and activities as told by your doctor. GET HELP RIGHT AWAY IF:  You have more puffiness (swelling) in your knee.  You see redness, puffiness, or have more pain in your knee.  You have a temperature by mouth above 102 F (38.9 C).  You get a rash.  You have trouble breathing.  You have a reaction to any medicine you are taking.  You have a lot of pain when you move your knee. MAKE SURE YOU:  Understand these instructions.  Will watch your condition.  Will get help right away if you are not doing well or get worse. Document Released: 10/17/2010 Document Revised: 12/07/2011 Document Reviewed: 10/17/2010 [employer] Patient Information 2014 [employer].  

## 2013-11-30 NOTE — ED Provider Notes (Signed)
Medical screening examination/treatment/procedure(s) were performed by non-physician practitioner and as supervising physician I was immediately available for consultation/collaboration.   EKG Interpretation None       Donnetta HutchingBrian Malky Rudzinski, MD 11/30/13 1024

## 2014-07-31 ENCOUNTER — Encounter (HOSPITAL_COMMUNITY): Payer: Self-pay

## 2014-07-31 ENCOUNTER — Emergency Department (HOSPITAL_COMMUNITY): Payer: Self-pay

## 2014-07-31 ENCOUNTER — Emergency Department (HOSPITAL_COMMUNITY)
Admission: EM | Admit: 2014-07-31 | Discharge: 2014-07-31 | Discharge status: Against Medical Advice | Payer: Self-pay | Attending: Emergency Medicine | Admitting: Emergency Medicine

## 2014-07-31 DIAGNOSIS — R42 Dizziness and giddiness: Secondary | ICD-10-CM | POA: Insufficient documentation

## 2014-07-31 DIAGNOSIS — Z3202 Encounter for pregnancy test, result negative: Secondary | ICD-10-CM | POA: Insufficient documentation

## 2014-07-31 DIAGNOSIS — R1110 Vomiting, unspecified: Secondary | ICD-10-CM | POA: Insufficient documentation

## 2014-07-31 LAB — BASIC METABOLIC PANEL
Anion gap: 12 (ref 5–15)
BUN: 10 mg/dL (ref 6–23)
CALCIUM: 9.2 mg/dL (ref 8.4–10.5)
CO2: 25 meq/L (ref 19–32)
Chloride: 103 mEq/L (ref 96–112)
Creatinine, Ser: 0.69 mg/dL (ref 0.50–1.10)
GFR calc Af Amer: >90 mL/min (ref >90)
GFR calc non Af Amer: >90 mL/min (ref >90)
GLUCOSE: 108 mg/dL — AB (ref 70–99)
Potassium: 3.8 mEq/L (ref 3.7–5.3)
SODIUM: 140 meq/L (ref 137–147)

## 2014-07-31 LAB — CBC
HEMATOCRIT: 35.1 % — AB (ref 36.0–46.0)
HEMOGLOBIN: 12.1 g/dL (ref 12.0–15.0)
MCH: 30 pg (ref 26.0–34.0)
MCHC: 34.5 g/dL (ref 30.0–36.0)
MCV: 86.9 fL (ref 78.0–100.0)
Platelets: 251 10*3/uL (ref 150–400)
RBC: 4.04 MIL/uL (ref 3.87–5.11)
RDW: 12.3 % (ref 11.5–15.5)
WBC: 8.4 10*3/uL (ref 4.0–10.5)

## 2014-07-31 LAB — I-STAT TROPONIN, ED: TROPONIN I, POC: 0.01 ng/mL (ref 0.00–0.08)

## 2014-07-31 LAB — PREGNANCY, URINE: Preg Test, Ur: NEGATIVE

## 2014-07-31 NOTE — ED Notes (Signed)
Pt states she has been having problems with dizziness for the past 2-3 weeks and episodes of vomiting. No diarrhea. States she has been really tired. Denies any pain.

## 2014-10-27 ENCOUNTER — Emergency Department (HOSPITAL_COMMUNITY)
Admission: EM | Admit: 2014-10-27 | Discharge: 2014-10-27 | Disposition: A | Discharge status: Home / Self Care | Payer: BLUE CROSS/BLUE SHIELD | Attending: Emergency Medicine | Admitting: Emergency Medicine

## 2014-10-27 ENCOUNTER — Encounter (HOSPITAL_COMMUNITY): Payer: Self-pay | Admitting: Emergency Medicine

## 2014-10-27 DIAGNOSIS — R519 Headache, unspecified: Secondary | ICD-10-CM

## 2014-10-27 DIAGNOSIS — Z3202 Encounter for pregnancy test, result negative: Secondary | ICD-10-CM | POA: Diagnosis not present

## 2014-10-27 DIAGNOSIS — Z880 Allergy status to penicillin: Secondary | ICD-10-CM | POA: Diagnosis not present

## 2014-10-27 DIAGNOSIS — R51 Headache: Secondary | ICD-10-CM | POA: Insufficient documentation

## 2014-10-27 DIAGNOSIS — Z91040 Latex allergy status: Secondary | ICD-10-CM | POA: Insufficient documentation

## 2014-10-27 LAB — CBC WITH DIFFERENTIAL/PLATELET
Basophils Absolute: 0 10*3/uL (ref 0.0–0.1)
Basophils Relative: 0 % (ref 0–1)
EOS ABS: 0.1 10*3/uL (ref 0.0–0.7)
Eosinophils Relative: 1 % (ref 0–5)
HEMATOCRIT: 38 % (ref 36.0–46.0)
Hemoglobin: 12.5 g/dL (ref 12.0–15.0)
Lymphocytes Relative: 31 % (ref 12–46)
Lymphs Abs: 2.7 10*3/uL (ref 0.7–4.0)
MCH: 29.8 pg (ref 26.0–34.0)
MCHC: 32.9 g/dL (ref 30.0–36.0)
MCV: 90.5 fL (ref 78.0–100.0)
MONOS PCT: 6 % (ref 3–12)
Monocytes Absolute: 0.5 10*3/uL (ref 0.1–1.0)
NEUTROS PCT: 62 % (ref 43–77)
Neutro Abs: 5.1 10*3/uL (ref 1.7–7.7)
Platelets: 256 10*3/uL (ref 150–400)
RBC: 4.2 MIL/uL (ref 3.87–5.11)
RDW: 12.7 % (ref 11.5–15.5)
WBC: 8.4 10*3/uL (ref 4.0–10.5)

## 2014-10-27 LAB — URINE MICROSCOPIC-ADD ON

## 2014-10-27 LAB — COMPREHENSIVE METABOLIC PANEL
ALK PHOS: 68 U/L (ref 39–117)
ALT: 17 U/L (ref 0–35)
AST: 20 U/L (ref 0–37)
Albumin: 4.3 g/dL (ref 3.5–5.2)
Anion gap: 7 (ref 5–15)
BUN: 8 mg/dL (ref 6–23)
CO2: 27 mmol/L (ref 19–32)
CREATININE: 0.81 mg/dL (ref 0.50–1.10)
Calcium: 9 mg/dL (ref 8.4–10.5)
Chloride: 106 mmol/L (ref 96–112)
GFR calc non Af Amer: >90 mL/min (ref >90)
Glucose, Bld: 90 mg/dL (ref 70–99)
Potassium: 3.7 mmol/L (ref 3.5–5.1)
Sodium: 140 mmol/L (ref 135–145)
Total Bilirubin: 0.6 mg/dL (ref 0.3–1.2)
Total Protein: 8 g/dL (ref 6.0–8.3)

## 2014-10-27 LAB — URINALYSIS, ROUTINE W REFLEX MICROSCOPIC
Bilirubin Urine: NEGATIVE
Glucose, UA: NEGATIVE mg/dL
Hgb urine dipstick: NEGATIVE
Ketones, ur: NEGATIVE mg/dL
Leukocytes, UA: NEGATIVE
NITRITE: NEGATIVE
Specific Gravity, Urine: >1.03 — ABNORMAL HIGH (ref 1.005–1.030)
UROBILINOGEN UA: 0.2 mg/dL (ref 0.0–1.0)
pH: 6 (ref 5.0–8.0)

## 2014-10-27 LAB — PREGNANCY, URINE: PREG TEST UR: NEGATIVE

## 2014-10-27 MED ORDER — IBUPROFEN 800 MG PO TABS
800.0000 mg | ORAL_TABLET | Freq: Once | ORAL | Status: AC
  Administered 2014-10-27: 800 mg via ORAL
  Filled 2014-10-27: qty 1

## 2014-10-27 MED ORDER — SODIUM CHLORIDE 0.9 % IV BOLUS (SEPSIS)
1000.0000 mL | Freq: Once | INTRAVENOUS | Status: AC
  Administered 2014-10-27: 1000 mL via INTRAVENOUS

## 2014-10-27 NOTE — Discharge Instructions (Signed)
Drink plenty of fluids. And take at least one day off a week.   Follow up as needed

## 2014-10-27 NOTE — ED Provider Notes (Signed)
CSN: [id number]     Arrival date & time 10/27/14  1223 History   First MD Initiated Contact with Patient 10/27/14 1327     Chief Complaint  Patient presents with  . Headache     (Consider location/radiation/quality/duration/timing/severity/associated sxs/prior Treatment) Patient is a 34 y.o. female presenting with headaches. The history is provided by the patient (pt states she feels tired and has  a headache.  the pt has been working 7 days a week for over a month).  Headache Pain location:  Generalized Radiates to:  Does not radiate Severity currently:  4/10 Severity at highest:  8/10 Onset quality:  Gradual Timing:  Intermittent Progression:  Waxing and waning Chronicity:  New Similar to prior headaches: no   Context: not activity   Associated symptoms: no abdominal pain, no back pain, no congestion, no cough, no diarrhea, no fatigue, no seizures and no sinus pressure     History reviewed. No pertinent past medical history. Past Surgical History  Procedure Laterality Date  . Reduction mammaplasty Right    History reviewed. No pertinent family history. History  Substance Use Topics  . Smoking status: Never Smoker   . Smokeless tobacco: Not on file  . Alcohol Use: No   OB History    Gravida Para Term Preterm AB TAB SAB Ectopic Multiple Living   0              Review of Systems  Constitutional: Negative for appetite change and fatigue.  HENT: Negative for congestion, ear discharge and sinus pressure.   Eyes: Negative for discharge.  Respiratory: Negative for cough.   Cardiovascular: Negative for chest pain.  Gastrointestinal: Negative for abdominal pain and diarrhea.  Genitourinary: Negative for frequency and hematuria.  Musculoskeletal: Negative for back pain.  Skin: Negative for rash.  Neurological: Positive for headaches. Negative for seizures.  Psychiatric/Behavioral: Negative for hallucinations.      Allergies  Bee venom; Latex; and Penicillins  Home  Medications   Prior to Admission medications   Medication Sig Start Date End Date Taking? Authorizing Provider  Multiple Vitamins-Calcium (ONE-A-DAY WOMENS PO) Take 1 tablet by mouth daily.     Yes Historical Provider, MD  ibuprofen (ADVIL,MOTRIN) 600 MG tablet Take 1 tablet (600 mg total) by mouth every 6 (six) hours as needed. Patient not taking: Reported on 10/27/2014 11/30/13   Burgess AmorJulie Idol, PA-C   BP 117/79 mmHg  Pulse 83  Temp(Src) 99.2 F (37.3 C) (Oral)  Resp 18  Ht 5\' 6"  (1.676 m)  Wt 168 lb (76.204 kg)  BMI 27.13 kg/m2  SpO2 100%  LMP 09/12/2014 Physical Exam  Constitutional: She is oriented to person, place, and time. She appears well-developed.  HENT:  Head: Normocephalic.  Eyes: Conjunctivae and EOM are normal. No scleral icterus.  Neck: Neck supple. No thyromegaly present.  Cardiovascular: Normal rate and regular rhythm.  Exam reveals no gallop and no friction rub.   No murmur heard. Pulmonary/Chest: No stridor. She has no wheezes. She has no rales. She exhibits no tenderness.  Abdominal: She exhibits no distension. There is no tenderness. There is no rebound.  Musculoskeletal: Normal range of motion. She exhibits no edema.  Lymphadenopathy:    She has no cervical adenopathy.  Neurological: She is oriented to person, place, and time. She exhibits normal muscle tone. Coordination normal.  Skin: No rash noted. No erythema.  Psychiatric: She has a normal mood and affect. Her behavior is normal.    ED Course  Procedures (including critical  care time) Labs Review Labs Reviewed  URINALYSIS, ROUTINE W REFLEX MICROSCOPIC - Abnormal; Notable for the following:    Specific Gravity, Urine >1.030 (*)    Protein, ur TRACE (*)    All other components within normal limits  URINE MICROSCOPIC-ADD ON - Abnormal; Notable for the following:    Squamous Epithelial / LPF FEW (*)    All other components within normal limits  PREGNANCY, URINE  CBC WITH DIFFERENTIAL/PLATELET   COMPREHENSIVE METABOLIC PANEL    Imaging Review No results found.   EKG Interpretation None      MDM   Final diagnoses:  None    Headache and fatique.  Pt instructed to take at least one day a week off to rest    Benny Lennert, MD 10/27/14 1501

## 2014-10-27 NOTE — ED Notes (Signed)
Pt reports several headaches upon waking up, fatigue for the past few weeks. LMP in December. Pt reports has taken several home pregnancy tests that have all been negative.

## 2014-11-01 ENCOUNTER — Encounter: Payer: Self-pay | Admitting: Adult Health

## 2014-11-01 ENCOUNTER — Ambulatory Visit (INDEPENDENT_AMBULATORY_CARE_PROVIDER_SITE_OTHER): Payer: BLUE CROSS/BLUE SHIELD | Admitting: Adult Health

## 2014-11-01 VITALS — BP 112/60 | Ht 66.0 in | Wt 168.0 lb

## 2014-11-01 DIAGNOSIS — N926 Irregular menstruation, unspecified: Secondary | ICD-10-CM

## 2014-11-01 DIAGNOSIS — Z319 Encounter for procreative management, unspecified: Secondary | ICD-10-CM | POA: Insufficient documentation

## 2014-11-01 DIAGNOSIS — N898 Other specified noninflammatory disorders of vagina: Secondary | ICD-10-CM | POA: Insufficient documentation

## 2014-11-01 DIAGNOSIS — Z3202 Encounter for pregnancy test, result negative: Secondary | ICD-10-CM

## 2014-11-01 DIAGNOSIS — R635 Abnormal weight gain: Secondary | ICD-10-CM

## 2014-11-01 DIAGNOSIS — Z113 Encounter for screening for infections with a predominantly sexual mode of transmission: Secondary | ICD-10-CM

## 2014-11-01 HISTORY — DX: Abnormal weight gain: R63.5

## 2014-11-01 HISTORY — DX: Irregular menstruation, unspecified: N92.6

## 2014-11-01 HISTORY — DX: Other specified noninflammatory disorders of vagina: N89.8

## 2014-11-01 HISTORY — DX: Encounter for procreative management, unspecified: Z31.9

## 2014-11-01 LAB — POCT URINE PREGNANCY: PREG TEST UR: NEGATIVE

## 2014-11-01 NOTE — Patient Instructions (Signed)
Preparing for Pregnancy Before trying to become pregnant, make an appointment with your health care provider (preconception care). The goal is to help you have a healthy, safe pregnancy. At your first appointment, your health care provider will:   Do a complete physical exam, including a Pap test.  Take a complete medical history.  Give you advice and help you resolve any problems. PRECONCEPTION CHECKLIST Here is a list of the basics to cover with your health care provider at your preconception visit:  Medical history.  Tell your health care provider about any diseases you have had. Many diseases can affect your pregnancy.  Include your partner's medical history and family history.  Make sure you have been tested for sexually transmitted infections (STIs). These can affect your pregnancy. In some cases, they can be passed to your baby. Tell your health care provider about any history of STIs.  Make sure your health care provider knows about any previous problems you have had with conception or pregnancy.  Tell your health care provider about any medicine you take. This includes herbal supplements and over-the-counter medicines.  Make sure all your immunizations are up to date. You may need to make additional appointments.  Ask your health care provider if you need any vaccinations or if there are any you should avoid.  Diet.  It is especially important to eat a healthy, balanced diet with the right nutrients when you are pregnant.  Ask your health care provider to help you get to a healthy weight before pregnancy.  If you are overweight, you are at higher risk for certain complications. These include high blood pressure, diabetes, and preterm birth.  If you are underweight, you are more likely to have a low-birth-weight baby.  Lifestyle.  Tell your health care provider about lifestyle factors such as alcohol use, drug use, or smoking.  Describe any harmful substances you may  be exposed to at work or home. These can include chemicals, pesticides, and radiation.  Mental health.  Let your health care provider know if you have been feeling depressed or anxious.  Let your health care provider know if you have a history of substance abuse.  Let your health care provider know if you do not feel safe at home. HOME INSTRUCTIONS TO PREPARE FOR PREGNANCY Follow your health care provider's advice and instructions.   Keep an accurate record of your menstrual periods. This makes it easier for your health care provider to determine your baby's due date.  Begin taking prenatal vitamins and folic acid supplements daily. Take them as directed by your health care provider.  Eat a balanced diet. Get help from a nutrition counselor if you have questions or need help.  Get regular exercise. Try to be active for at least 30 minutes a day most days of the week.  Quit smoking, if you smoke.  Do not drink alcohol.  Do not take illegal drugs.  Get medical problems, such as diabetes or high blood pressure, under control.  If you have diabetes, make sure you do the following:  Have good blood sugar control. If you have type 1 diabetes, use multiple daily doses of insulin. Do not use split-dose or premixed insulin.  Have an eye exam by a qualified eye care professional trained in caring for people with diabetes.  Get evaluated by your health care provider for cardiovascular disease.  Get to a healthy weight. If you are overweight or obese, reduce your weight with the help of a qualified health   professional such as a Museum/gallery exhibitions officerregistered dietitian. Ask your health care provider what the right weight range is for you. HOW DO I KNOW I AM PREGNANT? You may be pregnant if you have been sexually active and you miss your period. Symptoms of early pregnancy include:   Mild cramping.  Very light vaginal bleeding (spotting).  Feeling unusually tired.  Morning sickness. If you have any of  these symptoms, take a home pregnancy test. These tests look for a hormone called human chorionic gonadotropin (hCG) in your urine. Your body begins to make this hormone during early pregnancy. These tests are very accurate. Wait until at least the first day you miss your period to take one. If you get a positive result, call your health care provider to make appointments for prenatal care. WHAT SHOULD I DO IF I BECOME PREGNANT?  Make an appointment with your health care provider by week 12 of your pregnancy at the latest.  Do not smoke. Smoking can be harmful to your baby.  Do not drink alcoholic beverages. Alcohol is related to a number of birth defects.  Avoid toxic odors and chemicals.  You may continue to have sexual intercourse if it does not cause pain or other problems, such as vaginal bleeding. Document Released: 08/27/2008 Document Revised: 01/29/2014 Document Reviewed: 08/21/2013 [facility] Patient Information 2015 [place], [place]. This information is not intended to replace advice given to you by your health care provider. Make sure you discuss any questions you have with your health care provider. Return in 2 weeks for F/U

## 2014-11-01 NOTE — Progress Notes (Signed)
Subjective:     Patient ID: Rachel PintoKwisha L Stevens, female   DOB: [birth year], 34 y.o.   MRN: [id number]  HPI Rachel Stevens is a 34 year old black female, married, new to this practice, in complaining of missing a period and wants to be pregnant, had negative HPT.She says periods always have been regular.She has gained some weight.She had HSG in past was normal and husband had normal SA. She desires STD testing.Has ?HSV2 wants to be rechecked.  Review of Systems Patient denies any daily headaches,hearing loss, fatigue, blurred vision, shortness of breath, chest pain, abdominal pain, problems with bowel movements, urination, or intercourse. No joint pain or mood swings, see HPI for positives.   Reviewed past medical,surgical, social and family history. Reviewed medications and allergies.  Objective:   Physical Exam BP 112/60 mmHg  Ht 5\' 6"  (1.676 m)  Wt 168 lb (76.204 kg)  BMI 27.13 kg/m2  LMP 12/16/2015UPT negative.   Skin warm and dry, Thyroid normal without masses, abdomen is soft non tender with out masses.Pelvic: external genitalia is normal in appearance, no lesions, vagina: white discharge without odor, cervix:smooth and nullipareous, uterus: normal size, shape and contour, non tender, no masses felt, adnexa: no masses or tenderness noted.   Assessment:     Missed period Weight gain Vaginal discharge Pt desires pregnancy STD screening    Plan:     Check HIV,RPR,HSV2 and GC/CHL and progesterone   Take OTC prenatal vitamins Review handout on preparing for pregnancy Follow up in 2 weeks to review labs and see if has had period, if not will Rx provera and discuss clomid

## 2014-11-02 LAB — TSH: TSH: 3.19 u[IU]/mL (ref 0.450–4.500)

## 2014-11-02 LAB — HSV 2 ANTIBODY, IGG: HSV 2 IGG,TYPE SPECIFIC AB: 3.71 index — ABNORMAL HIGH (ref 0.00–0.90)

## 2014-11-02 LAB — HIV ANTIBODY (ROUTINE TESTING W REFLEX): HIV SCREEN 4TH GENERATION: NONREACTIVE

## 2014-11-02 LAB — RPR: RPR Ser Ql: NONREACTIVE

## 2014-11-02 LAB — PROGESTERONE: PROGESTERONE: 6.7 ng/mL

## 2014-11-06 LAB — GC/CHLAMYDIA PROBE AMP
CHLAMYDIA, DNA PROBE: NEGATIVE
NEISSERIA GONORRHOEAE BY PCR: NEGATIVE

## 2014-11-13 ENCOUNTER — Telehealth: Payer: Self-pay | Admitting: Adult Health

## 2014-11-13 MED ORDER — VALACYCLOVIR HCL 1 G PO TABS: 1000.0000 mg | ORAL_TABLET | Freq: Every day | ORAL | Status: DC

## 2014-11-13 NOTE — Telephone Encounter (Signed)
Wants refill on valtrex, saw labs on my chart has appt this week

## 2014-11-15 ENCOUNTER — Encounter: Payer: Self-pay | Admitting: Adult Health

## 2014-11-15 ENCOUNTER — Ambulatory Visit (INDEPENDENT_AMBULATORY_CARE_PROVIDER_SITE_OTHER): Payer: BLUE CROSS/BLUE SHIELD | Admitting: Adult Health

## 2014-11-15 VITALS — BP 118/76 | Ht 66.0 in | Wt 170.5 lb

## 2014-11-15 DIAGNOSIS — B009 Herpesviral infection, unspecified: Secondary | ICD-10-CM

## 2014-11-15 DIAGNOSIS — Z319 Encounter for procreative management, unspecified: Secondary | ICD-10-CM

## 2014-11-15 HISTORY — DX: Herpesviral infection, unspecified: B00.9

## 2014-11-15 NOTE — Patient Instructions (Signed)
Have sex day 7- 24 every other day Pee before sex Lay there after sex Get progesterone level 3/1 Genital Herpes Genital herpes is a sexually transmitted disease. This means that it is a disease passed by having sex with an infected person. There is no cure for genital herpes. The time between attacks can be months to years. The virus may live in a person but produce no problems (symptoms). This infection can be passed to a baby as it travels down the birth canal (vagina). In a newborn, this can cause central nervous system damage, eye damage, or even death. The virus that causes genital herpes is usually HSV-2 virus. The virus that causes oral herpes is usually HSV-1. The diagnosis (learning what is wrong) is made through culture results. SYMPTOMS  Usually symptoms of pain and itching begin a few days to a week after contact. It first appears as small blisters that progress to small painful ulcers which then scab over and heal after several days. It affects the outer genitalia, birth canal, cervix, penis, anal area, buttocks, and thighs. HOME CARE INSTRUCTIONS   Keep ulcerated areas dry and clean.  Take medications as directed. Antiviral medications can speed up healing. They will not prevent recurrences or cure this infection. These medications can also be taken for suppression if there are frequent recurrences.  While the infection is active, it is contagious. Avoid all sexual contact during active infections.  Condoms may help prevent spread of the herpes virus.  Practice safe sex.  Wash your hands thoroughly after touching the genital area.  Avoid touching your eyes after touching your genital area.  Inform your caregiver if you have had genital herpes and become pregnant. It is your responsibility to insure a safe outcome for your baby in this pregnancy.  Only take over-the-counter or prescription medicines for pain, discomfort, or fever as directed by your caregiver. SEEK MEDICAL  CARE IF:   You have a recurrence of this infection.  You do not respond to medications and are not improving.  You have new sources of pain or discharge which have changed from the original infection.  You have an oral temperature above 102 F (38.9 C).  You develop abdominal pain.  You develop eye pain or signs of eye infection. Document Released: 09/11/2000 Document Revised: 12/07/2011 Document Reviewed: 10/02/2009 [facility] Patient Information 2015 [place]. This information is not intended to replace advice given to you by your health care provider. Make sure you discuss any questions you have with your health care provider.

## 2014-11-15 NOTE — Progress Notes (Signed)
Subjective:     Patient ID: Rachel Stevens, female   DOB: 28-Nov-1980, 34 y.o.   MRN: [id number]  HPI Gaspar ColaKwisha is a 34 year old black female in to discuss getting pregnant and review labs.She started her period 2/10. She had negative work up in 2014 and even had IVF x 1, says she is lazy and does not want to have sex.Has burning at times, with history of BV.  Review of Systems  See HPI for positives, other systems negative. Reviewed past medical,surgical, social and family history. Reviewed medications and allergies.     Objective:   Physical Exam BP 118/76 mmHg  Ht 5\' 6"  (1.676 m)  Wt 170 lb 8 oz (77.338 kg)  BMI 27.53 kg/m2  LMP 11/07/2014   Reviewed labs, HSV 2 +, progesterone level 6.7, TSH WNL.She did not ovulate last month, has never missed period before, explained ovulation and timing of sex.Told her she has got to have sex to get pregnant.Pee before sex and lay there after sex.Discussed would suppress herpes when pregnant, with valtrex. Face time 10 minutes discussing that she has to have sex regularly to get pregnant. Assessment:     Desires pregnancy HSV2    Plan:    Take valtrex 1 daily Take vitamin with folic acid Have sex every other day now through day 24 Check progesterone level 3/1, will talk with results Review handout on herpes

## 2014-11-27 ENCOUNTER — Other Ambulatory Visit: Payer: BLUE CROSS/BLUE SHIELD

## 2014-11-27 DIAGNOSIS — Z319 Encounter for procreative management, unspecified: Secondary | ICD-10-CM

## 2014-11-28 ENCOUNTER — Telehealth: Payer: Self-pay | Admitting: Adult Health

## 2014-11-28 LAB — PROGESTERONE: Progesterone: 3.2 ng/mL

## 2014-11-28 MED ORDER — CLOMIPHENE CITRATE 50 MG PO TABS: ORAL_TABLET | ORAL | Status: DC

## 2014-11-28 NOTE — Telephone Encounter (Signed)
Pt aware of progesterone level and that she did not ovulate wants to try clomid, will rx clomid 500 mg #5 take 1 day 3-7 of cycle and have sex QOD day 7-24 of cycle and check progesterone level day 21

## 2014-11-29 ENCOUNTER — Telehealth: Payer: Self-pay | Admitting: Adult Health

## 2014-11-29 NOTE — Telephone Encounter (Signed)
Had question about which pain meds she could use, can use tylenol while trying to get pregnant

## 2014-12-19 ENCOUNTER — Emergency Department: Payer: Self-pay | Admitting: Emergency Medicine

## 2014-12-21 LAB — BETA STREP CULTURE(ARMC)

## 2015-01-08 ENCOUNTER — Telehealth: Payer: Self-pay | Admitting: Adult Health

## 2015-01-08 NOTE — Telephone Encounter (Signed)
Spoke with pt. Pt states Valtrex is not helping. She wants to try something different. Please call pt. Thanks!! JSY

## 2015-01-08 NOTE — Telephone Encounter (Signed)
Complains of burning in vagina make appt

## 2015-01-11 ENCOUNTER — Emergency Department
Admit: 2015-01-11 | Disposition: A | Discharge status: Home / Self Care | Payer: Self-pay | Admitting: Emergency Medicine

## 2015-01-11 LAB — URINALYSIS, COMPLETE
BILIRUBIN, UR: NEGATIVE
BLOOD: NEGATIVE
Bacteria: NONE SEEN
Glucose,UR: NEGATIVE mg/dL (ref 0–75)
Ketone: NEGATIVE
LEUKOCYTE ESTERASE: NEGATIVE
Nitrite: NEGATIVE
Ph: 7 (ref 4.5–8.0)
Protein: NEGATIVE
Specific Gravity: 1.014 (ref 1.003–1.030)

## 2015-01-11 LAB — WET PREP, GENITAL

## 2015-01-11 LAB — PREGNANCY, URINE: PREGNANCY TEST, URINE: NEGATIVE m[IU]/mL

## 2015-01-11 LAB — GC/CHLAMYDIA PROBE AMP

## 2015-01-19 ENCOUNTER — Encounter (HOSPITAL_COMMUNITY): Payer: Self-pay | Admitting: *Deleted

## 2015-01-19 ENCOUNTER — Emergency Department (HOSPITAL_COMMUNITY)
Admission: EM | Admit: 2015-01-19 | Discharge: 2015-01-19 | Disposition: A | Discharge status: Home / Self Care | Payer: BLUE CROSS/BLUE SHIELD | Attending: Emergency Medicine | Admitting: Emergency Medicine

## 2015-01-19 DIAGNOSIS — Z8742 Personal history of other diseases of the female genital tract: Secondary | ICD-10-CM | POA: Insufficient documentation

## 2015-01-19 DIAGNOSIS — J069 Acute upper respiratory infection, unspecified: Secondary | ICD-10-CM | POA: Insufficient documentation

## 2015-01-19 DIAGNOSIS — Z880 Allergy status to penicillin: Secondary | ICD-10-CM | POA: Insufficient documentation

## 2015-01-19 DIAGNOSIS — B349 Viral infection, unspecified: Secondary | ICD-10-CM | POA: Insufficient documentation

## 2015-01-19 DIAGNOSIS — Z91040 Latex allergy status: Secondary | ICD-10-CM | POA: Diagnosis not present

## 2015-01-19 DIAGNOSIS — R05 Cough: Secondary | ICD-10-CM | POA: Diagnosis present

## 2015-01-19 DIAGNOSIS — H9203 Otalgia, bilateral: Secondary | ICD-10-CM | POA: Diagnosis not present

## 2015-01-19 NOTE — ED Notes (Addendum)
Pt upset about not getting a strep test. Pt wanting to refuse ed charges because she is going to go to another hospital and does not want to have 2 bills. Pt refused to sign her electronic signature for discharge. Pt wanting Dr. 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 Pt also stating her sister is the DON of [facility] and several of her family members work for [employer] [employer] and they will let her know what to do about the situation.

## 2015-01-19 NOTE — ED Notes (Signed)
Sore throat since wednesday

## 2015-01-19 NOTE — ED Provider Notes (Signed)
CSN: [id number]     Arrival date & time 01/19/15  2250 History  This chart was scribed for Rachel Albe, MD by Modena Jansky, ED Scribe. This patient was seen in room APA11/APA11 and the patient's care was started at 11:04 PM.   No chief complaint on file.  The history is provided by the patient. No language interpreter was used.   HPI Comments: Rachel Stevens is a 34 y.o. female who presents to the Emergency Department complaining of "throat". She then relates burning of her throat that started 3 days ago, April 20th. She reports that she has been sick lately with a sore throat, chills,  bilateral ear discomfort, and intermittent dry cough that started 3 days ago. She states that the cough is nonproductive with post-tussive emesis. She is unsure of fever. She states no modifying factors. She reports having sick contacts with flu at work. She denies any hx of smoking or drinking. She also denies any trouble swallowing, difficulty breathing, fever, trouble hearing, SOB, wheezing, generalized myalgias, nausea, or diarrhea.   [facility]  Past Medical History  Diagnosis Date  . Herpes   . Missed period 11/01/2014  . Weight gain 11/01/2014  . Vaginal discharge 11/01/2014  . Patient desires pregnancy 11/01/2014  . 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 Reduction mammaplasty Right    Family History  Problem Relation Age of Onset  . Hypertension Mother   . Heart disease Mother   . Cancer Father     throat  . Congestive Heart Failure Brother   . Heart disease Maternal Grandmother   . Cancer Paternal Grandmother    History  Substance Use Topics  . Smoking status: Never Smoker   . Smokeless tobacco: Never Used  . Alcohol Use: No   Employed   OB History    Gravida Para Term Preterm AB TAB SAB Ectopic Multiple Living   0              Review of Systems  Constitutional: Negative for fever.  HENT: Positive for ear pain and  sore throat. Negative for hearing loss and trouble swallowing.   Respiratory: Positive for cough. Negative for shortness of breath and wheezing.   Gastrointestinal: Positive for vomiting (post-tussive). Negative for nausea and diarrhea.  Musculoskeletal: Negative for myalgias (generalized ).  All other systems reviewed and are negative.   Allergies  Bee venom; Latex; and Penicillins  Home Medications   Prior to Admission medications   Not on File   BP 115/83 mmHg  Pulse 95  Temp(Src) 99.1 F (37.3 C)  Resp 20  Ht  (1.676 m)  Wt 158 lb (71.668 kg)  BMI 25.51 kg/m2  SpO2 100%  LMP 01/16/2015  Vital signs normal except low grade temp  Physical Exam  Constitutional: She is oriented to person, place, and time. She appears well-developed and well-nourished.  Non-toxic appearance. She does not appear ill. No distress.  HENT:  Head: Normocephalic and atraumatic.  Right Ear: Hearing, tympanic membrane, external ear and ear canal normal.  Left Ear: Hearing, tympanic membrane, external ear and ear canal normal.  Nose: Nose normal. No mucosal edema or rhinorrhea.  Mouth/Throat: Oropharynx is clear and moist and mucous membranes are normal. No dental abscesses or uvula swelling. No oropharyngeal exudate.  Tonsils mildly enlarged without redness or exudates. No soft palate swelling, uvula normal and midline. Voice normal. Able to swallow saliva.  Eyes: Conjunctivae and EOM are normal. Pupils are equal, round, and reactive to light.  Neck: Normal range of motion and full passive range of motion without pain. Neck supple. No tracheal deviation present.  No cervical lymphadenopathy.   Cardiovascular: Normal rate, regular rhythm and normal heart sounds.  Exam reveals no gallop and no friction rub.   No murmur heard. Pulmonary/Chest: Effort normal and breath sounds normal. No respiratory distress. She has no wheezes. She has no rhonchi. She has no rales. She exhibits no tenderness and no  crepitus.  No coughing during interview  Abdominal: Soft. Normal appearance and bowel sounds are normal. She exhibits no distension. There is no tenderness. There is no rebound and no guarding.  Musculoskeletal: Normal range of motion. She exhibits no edema or tenderness.  Moves all extremities well.   Lymphadenopathy:    She has no cervical adenopathy.  Neurological: She is alert and oriented to person, place, and time. She has normal strength. No cranial nerve deficit.  Skin: Skin is warm, dry and intact. No rash noted. No erythema. No pallor.  Psychiatric: She has a normal mood and affect. Her speech is normal and behavior is normal. Her mood appears not anxious.  Nursing note and vitals reviewed.   ED Course  Procedures (including critical care time) DIAGNOSTIC STUDIES: Oxygen Saturation is 100% on RA, normal by my interpretation.    COORDINATION OF CARE: 11:08 PM- Pt advised of plan for treatment and pt agrees.  We discussed symptomatic treatment for her sore throat. Pt has URI symptoms which makes strept infection unlikely, also she reports no exposure to strept throat but exposure to persons with the flu.   Labs Review Labs Reviewed - No data to display  Imaging Review No results found.   EKG Interpretation None      MDM   Final diagnoses:  Viral illness  URI, acute   Plan discharge  Rachel AlbeIva Bethania Schlotzhauer, MD, FACEP    I personally performed the services described in this documentation, which was scribed in my presence. The recorded information has been reviewed and considered.  Rachel AlbeIva Tal Neer, MD, [facility] PyoFACEP     Jaleia Hanke, MD 01/19/15 [phone]

## 2015-01-19 NOTE — Discharge Instructions (Signed)
Drink lots of fluids. Use throat spray, use throat lozenges for comfort. You can take ibuprofen 600 mg + acetaminophen 1000 mg 4 times a day for pain or fever. Use mucinex DM OTC for cough. Recheck if you get a high fever, you are unable to swallow, have difficulty breathing or you feel worse.    Upper Respiratory Infection, Adult An upper respiratory infection (URI) is also sometimes known as the common cold. The upper respiratory tract includes the nose, sinuses, throat, trachea, and bronchi. Bronchi are the airways leading to the lungs. Most people improve within 1 week, but symptoms can last up to 2 weeks. A residual cough may last even longer.  CAUSES Many different viruses can infect the tissues lining the upper respiratory tract. The tissues become irritated and inflamed and often become very moist. Mucus production is also common. A cold is contagious. You can easily spread the virus to others by oral contact. This includes kissing, sharing a glass, coughing, or sneezing. Touching your mouth or nose and then touching a surface, which is then touched by another person, can also spread the virus. SYMPTOMS  Symptoms typically develop 1 to 3 days after you come in contact with a cold virus. Symptoms vary from person to person. They may include:  Runny nose.  Sneezing.  Nasal congestion.  Sinus irritation.  Sore throat.  Loss of voice (laryngitis).  Cough.  Fatigue.  Muscle aches.  Loss of appetite.  Headache.  Low-grade fever. DIAGNOSIS  You might diagnose your own cold based on familiar symptoms, since most people get a cold 2 to 3 times a year. Your caregiver can confirm this based on your exam. Most importantly, your caregiver can check that your symptoms are not due to another disease such as strep throat, sinusitis, pneumonia, asthma, or epiglottitis. Blood tests, throat tests, and X-rays are not necessary to diagnose a common cold, but they may sometimes be helpful in  excluding other more serious diseases. Your caregiver will decide if any further tests are required. RISKS AND COMPLICATIONS  You may be at risk for a more severe case of the common cold if you smoke cigarettes, have chronic heart disease (such as heart failure) or lung disease (such as asthma), or if you have a weakened immune system. The very young and very old are also at risk for more serious infections. Bacterial sinusitis, middle ear infections, and bacterial pneumonia can complicate the common cold. The common cold can worsen asthma and chronic obstructive pulmonary disease (COPD). Sometimes, these complications can require emergency medical care and may be life-threatening. PREVENTION  The best way to protect against getting a cold is to practice good hygiene. Avoid oral or hand contact with people with cold symptoms. Wash your hands often if contact occurs. There is no clear evidence that vitamin C, vitamin E, echinacea, or exercise reduces the chance of developing a cold. However, it is always recommended to get plenty of rest and practice good nutrition. TREATMENT  Treatment is directed at relieving symptoms. There is no cure. Antibiotics are not effective, because the infection is caused by a virus, not by bacteria. Treatment may include:  Increased fluid intake. Sports drinks offer valuable electrolytes, sugars, and fluids.  Breathing heated mist or steam (vaporizer or shower).  Eating chicken soup or other clear broths, and maintaining good nutrition.  Getting plenty of rest.  Using gargles or lozenges for comfort.  Controlling fevers with ibuprofen or acetaminophen as directed by your caregiver.  Increasing usage  of your inhaler if you have asthma. Zinc gel and zinc lozenges, taken in the first 24 hours of the common cold, can shorten the duration and lessen the severity of symptoms. Pain medicines may help with fever, muscle aches, and throat pain. A variety of non-prescription  medicines are available to treat congestion and runny nose. Your caregiver can make recommendations and may suggest nasal or lung inhalers for other symptoms.  HOME CARE INSTRUCTIONS   Only take over-the-counter or prescription medicines for pain, discomfort, or fever as directed by your caregiver.  Use a warm mist humidifier or inhale steam from a shower to increase air moisture. This may keep secretions moist and make it easier to breathe.  Drink enough water and fluids to keep your urine clear or pale yellow.  Rest as needed.  Return to work when your temperature has returned to normal or as your caregiver advises. You may need to stay home longer to avoid infecting others. You can also use a face mask and careful hand washing to prevent spread of the virus. SEEK MEDICAL CARE IF:   After the first few days, you feel you are getting worse rather than better.  You need your caregiver's advice about medicines to control symptoms.  You develop chills, worsening shortness of breath, or brown or red sputum. These may be signs of pneumonia.  You develop yellow or brown nasal discharge or pain in the face, especially when you bend forward. These may be signs of sinusitis.  You develop a fever, swollen neck glands, pain with swallowing, or white areas in the back of your throat. These may be signs of strep throat. SEEK IMMEDIATE MEDICAL CARE IF:   You have a fever.  You develop severe or persistent headache, ear pain, sinus pain, or chest pain.  You develop wheezing, a prolonged cough, cough up blood, or have a change in your usual mucus (if you have chronic lung disease).  You develop sore muscles or a stiff neck. Document Released: 03/10/2001 Document Revised: 12/07/2011 Document Reviewed: 12/20/2013 [facility] Patient Information 2015 [place], [place]. This information is not intended to replace advice given to you by your health care provider. Make sure you discuss any questions you have  with your health care provider.

## 2015-01-21 ENCOUNTER — Ambulatory Visit: Payer: BLUE CROSS/BLUE SHIELD | Admitting: Adult Health

## 2015-01-28 ENCOUNTER — Ambulatory Visit: Payer: BLUE CROSS/BLUE SHIELD | Admitting: Adult Health

## 2015-01-31 ENCOUNTER — Ambulatory Visit (INDEPENDENT_AMBULATORY_CARE_PROVIDER_SITE_OTHER): Payer: BLUE CROSS/BLUE SHIELD | Admitting: Adult Health

## 2015-01-31 ENCOUNTER — Encounter: Payer: Self-pay | Admitting: Adult Health

## 2015-01-31 VITALS — BP 118/72 | HR 68 | Ht 66.0 in | Wt 166.0 lb

## 2015-01-31 DIAGNOSIS — Z3202 Encounter for pregnancy test, result negative: Secondary | ICD-10-CM | POA: Diagnosis not present

## 2015-01-31 DIAGNOSIS — N926 Irregular menstruation, unspecified: Secondary | ICD-10-CM | POA: Diagnosis not present

## 2015-01-31 DIAGNOSIS — N898 Other specified noninflammatory disorders of vagina: Secondary | ICD-10-CM | POA: Diagnosis not present

## 2015-01-31 DIAGNOSIS — Z319 Encounter for procreative management, unspecified: Secondary | ICD-10-CM

## 2015-01-31 HISTORY — DX: Irregular menstruation, unspecified: N92.6

## 2015-01-31 LAB — POCT WET PREP (WET MOUNT)

## 2015-01-31 LAB — POCT URINE PREGNANCY: PREG TEST UR: NEGATIVE

## 2015-01-31 NOTE — Progress Notes (Signed)
Subjective:     Patient ID: Unk [name], female   DOB: 01/10/1981, 34 y.o.   MRN: [id number]  HPI Rachel Stevens is a 34 year old black female complains of having 2 periods in April,one on the 2nd and another on the 20th, was treated recently for BV.  Review of Systems Irregular periods, all other systems negative Reviewed past medical,surgical, social and family history. Reviewed medications and allergies.     Objective:   Physical Exam BP 118/72 mmHg  Pulse 68  Ht 5\' 6"  (1.676 m)  Wt 166 lb (75.297 kg)  BMI 26.81 kg/m2  LMP 04/20/2016UPT negative, Skin warm and dry.Pelvic: external genitalia is normal in appearance no lesions, vagina: white discharge without odor,urethra has no lesions or masses noted, cervix:smooth, uterus: normal size, shape and contour, non tender, no masses felt, adnexa: no masses or tenderness noted. Bladder is non tender and no masses felt. Wet prep: negative   Discussed that she probably did not ovulate this cycle, she has Rx for clomid but has not started it yet.She is still trying to understand the herpes diagnosis.  Assessment:     Irregular periods Vaginal discharge Desires pregnancy     Plan:     Take clomid day 3-7 of cycle and check progesterone level day 21 and have sec day 7-24 every other day and call me with period Review handout on infertility problems by [facility]

## 2015-01-31 NOTE — Patient Instructions (Signed)
Take clomid day 3-7 of cycle, progesterone level day 21 sex every other day day 7-24

## 2015-04-24 ENCOUNTER — Ambulatory Visit: Payer: BLUE CROSS/BLUE SHIELD | Admitting: Adult Health

## 2015-04-29 ENCOUNTER — Other Ambulatory Visit: Payer: Self-pay | Admitting: Adult Health

## 2015-05-15 ENCOUNTER — Encounter (HOSPITAL_COMMUNITY): Payer: Self-pay | Admitting: Emergency Medicine

## 2015-05-15 ENCOUNTER — Emergency Department (HOSPITAL_COMMUNITY): Payer: BLUE CROSS/BLUE SHIELD

## 2015-05-15 ENCOUNTER — Emergency Department (HOSPITAL_COMMUNITY)
Admission: EM | Admit: 2015-05-15 | Discharge: 2015-05-15 | Disposition: A | Discharge status: Home / Self Care | Payer: BLUE CROSS/BLUE SHIELD | Attending: Emergency Medicine | Admitting: Emergency Medicine

## 2015-05-15 DIAGNOSIS — Z8619 Personal history of other infectious and parasitic diseases: Secondary | ICD-10-CM | POA: Diagnosis not present

## 2015-05-15 DIAGNOSIS — Z880 Allergy status to penicillin: Secondary | ICD-10-CM | POA: Diagnosis not present

## 2015-05-15 DIAGNOSIS — Z91040 Latex allergy status: Secondary | ICD-10-CM | POA: Diagnosis not present

## 2015-05-15 DIAGNOSIS — Z8742 Personal history of other diseases of the female genital tract: Secondary | ICD-10-CM | POA: Insufficient documentation

## 2015-05-15 DIAGNOSIS — K219 Gastro-esophageal reflux disease without esophagitis: Secondary | ICD-10-CM | POA: Insufficient documentation

## 2015-05-15 DIAGNOSIS — R079 Chest pain, unspecified: Secondary | ICD-10-CM | POA: Diagnosis present

## 2015-05-15 MED ORDER — PANTOPRAZOLE SODIUM 20 MG PO TBEC: 20.0000 mg | DELAYED_RELEASE_TABLET | Freq: Two times a day (BID) | ORAL | Status: DC

## 2015-05-15 NOTE — ED Notes (Signed)
Pt reports left/central chest pain x1 month with no radiation.  Pt also c/o h/a. Pt is alert and oriented, complains that pain is worse after eating.

## 2015-05-15 NOTE — Discharge Instructions (Signed)
Food Choices for Gastroesophageal Reflux Disease When you have gastroesophageal reflux disease (GERD), the foods you eat and your eating habits are very important. Choosing the right foods can help ease the discomfort of GERD. WHAT GENERAL GUIDELINES DO I NEED TO FOLLOW?  Choose fruits, vegetables, whole grains, low-fat dairy products, and low-fat meat, fish, and poultry.  Limit fats such as oils, salad dressings, butter, nuts, and avocado.  Keep a food diary to identify foods that cause symptoms.  Avoid foods that cause reflux. These may be different for different people.  Eat frequent small meals instead of three large meals each day.  Eat your meals slowly, in a relaxed setting.  Limit fried foods.  Cook foods using methods other than frying.  Avoid drinking alcohol.  Avoid drinking large amounts of liquids with your meals.  Avoid bending over or lying down until 2-3 hours after eating. WHAT FOODS ARE NOT RECOMMENDED? The following are some foods and drinks that may worsen your symptoms: Vegetables Tomatoes. Tomato juice. Tomato and spaghetti sauce. Chili peppers. Onion and garlic. Horseradish. Fruits Oranges, grapefruit, and lemon (fruit and juice). Meats High-fat meats, fish, and poultry. This includes hot dogs, ribs, ham, sausage, salami, and bacon. Dairy Whole milk and chocolate milk. Sour cream. Cream. Butter. Ice cream. Cream cheese.  Beverages Coffee and tea, with or without caffeine. Carbonated beverages or energy drinks. Condiments Hot sauce. Barbecue sauce.  Sweets/Desserts Chocolate and cocoa. Donuts. Peppermint and spearmint. Fats and Oils High-fat foods, including [place] fries and potato chips. Other Vinegar. Strong spices, such as black pepper, white pepper, red pepper, cayenne, curry powder, cloves, ginger, and chili powder. The items listed above may not be a complete list of foods and beverages to avoid. Contact your dietitian for more  information. Document Released: 09/14/2005 Document Revised: 09/19/2013 Document Reviewed: 07/19/2013 [facility] Patient Information 2015 [place], [place]. This information is not intended to replace advice given to you by your health care provider. Make sure you discuss any questions you have with your health care provider.   Emergency Department Resource Guide 1) Find a Doctor and Pay Out of Pocket Although you won't have to find out who is covered by your insurance plan, it is a good idea to ask around and get recommendations. You will then need to call the office and see if the doctor you have chosen will accept you as a new patient and what types of options they offer for patients who are self-pay. Some doctors offer discounts or will set up payment plans for their patients who do not have insurance, but you will need to ask so you aren't surprised when you get to your appointment.  2) Contact Your Local Health Department Not all health departments have doctors that can see patients for sick visits, but many do, so it is worth a call to see if yours does. If you don't know where your local health department is, you can check in your phone book. The CDC also has a tool to help you locate your state's health department, and many state websites also have listings of all of their local health departments.  3) Find a Walk-in Clinic If your illness is not likely to be very severe or complicated, you may want to try a walk in clinic. These are popping up all over the country in pharmacies, drugstores, and shopping centers. They're usually staffed by nurse practitioners or physician assistants that have been trained to treat common illnesses and complaints. They're usually fairly quick  and inexpensive. However, if you have serious medical issues or chronic medical problems, these are probably not your best option.  No Primary Care Doctor: - Call Health Connect at  [phone] - they can help you locate a  primary care doctor that  accepts your insurance, provides certain services, etc. - Physician Referral Service- [phone]  Chronic Pain Problems: Organization         Address  Phone   Notes  [facility]  [phone] Patients need to be referred by their primary care doctor.   Medication Assistance: Organization         Address  Phone   Notes  [facility] [address] [place] [place], [place] [address] [phone] --Must be a resident of [facility] -- Must have NO insurance coverage whatsoever (no Medicaid/ Medicare, etc.) -- The pt. MUST have a primary care doctor that directs their care regularly and follows them in the community   [facility]  [phone]   [employer]  [phone]    Agencies that provide inexpensive medical care: Organization         Address  Phone   Notes  [facility]  [phone]   [facility]    [phone]   [facility] [address] [place], [place] [address] [phone]   [facility] [address] [place] [place] [phone]   Planned Parenthood    [phone]   [facility]    [phone]   [facility]  [address], [place] Phone:  [phone], Fax:  [phone] Hours of Operation:  9 am - 6 pm, M-F.  Also accepts Medicaid/Medicare and self-pay.  [facility]  [address], [place] Phone: [phone], Fax: [phone]. Hours of Operation:  8:30 am - 5:30 pm, M-F.  Also accepts Medicaid and self-pay.  [facility] [place] [address] [place] Phone: [phone]   [facility] [address] Natasha Bence [place], [place] [phone], Ext. [phone] Mondays & Thursdays: 7-9 AM.  First 15 patients are seen on a first come, first serve basis.    Medicaid-accepting [facility]  Providers:  Organization         Address  Phone   Notes  [facility] [address], [place] [phone] Also accepts self-pay patients.  [facility] [address] Laurell Josephs [place], [place]  [phone]   [facility] [address], [place] [phone]   [facility] [address] [place] [phone]   Renaye Rakers [address], [place]   [phone] Only accepts [place] Access [place] patients after they have their name applied to their card.   Self-Pay (no insurance) in [facility]:  Organization         Address  Phone   Notes  Sickle Cell Patients, [facility] [address] [place], [place] [phone]   [facility] [address] [place], [place] [phone]   [facility] [place]  [address], [place] [phone]   [facility]/Dr. Osei-Bonsu  [address] [place] or [address], [place] [phone] Phone number for  both [place] and [place] locations is the same.  [facility] [address], [place] [phone]   [facility] [address], [place] or [address] [phone]   [facility] [address], [place] [phone], phone; [phone], fax Sees patients 1st and 3rd Saturday of every month.  Must not qualify for public or private insurance (i.e. Medicaid, Medicare, [place] Health Choice, Veterans' Benefits)  Household income should be no more than 200% of the poverty level The clinic cannot treat you if you are pregnant or think you are pregnant  Sexually transmitted diseases are not treated at the clinic.    Dental Care: Organization         Address  Phone  Notes  [facility] of [facility] [address] [place], [place] [phone] Accepts children up to age 58 who are enrolled in [place] or [place] Health Choice; pregnant women with a Medicaid card; and children who have applied for Medicaid or [place] Health Choice, but were declined, whose parents can pay a reduced fee at time of service.  [facility] Department of [facility]  [address], [place] [phone] Accepts children up to age 83 who are enrolled in [place] or [place] Health Choice; pregnant women with a Medicaid card; and children who have applied for Medicaid or  Health Choice, but were declined, whose parents can pay a reduced fee at time of service.  [facility]  [address] [place], [place] [phone] Patients are seen by appointment only. Walk-ins are not accepted. [facility] will see patients 43 years of age and older. Monday - Tuesday (8am-5pm) Most Wednesdays (8:30-5pm) $30 per visit, cash only  [facility] Adult Dental Access PRO[facility]  [address], [facility] [phone] Patients are seen by appointment only. Walk-ins are not accepted. [facility] will see patients 70 years of age and older. One Wednesday Evening (Monthly: Volunteer Based).  $30 per visit, cash only  [employer]  [phone] for adults; Children under age 46, call Graduate Pediatric Dentistry at [phone]. Children aged 50-14, please call [phone] to request a pediatric application.  Dental services are provided in all areas of dental care including fillings, crowns and bridges, complete and partial dentures, implants, gum treatment, root canals, and extractions. Preventive care is also provided. Treatment is provided to both adults and children. Patients are selected via a lottery and there is often a waiting list.   [facility] [address] [place]  [phone] [email or web address]   [facility]  [address] [place], [place] [phone], Ext. [phone] Second and Fourth Thursday of each month, opens at 6:30 AM; Clinic ends at 9 AM.  Patients are seen on a first-come first-served basis, and a limited number are seen during each clinic.   [facility]  [address] Ether Griffins [place], [place] [phone]   Eligibility Requirements You must have lived in [place], [place], or [place] counties for at least the last three months.   You cannot be eligible for state or federal sponsored [place], including [employer], [place], or [employer].   You generally cannot be eligible for healthcare insurance through your employer.    How to apply: Eligibility screenings are held every Tuesday and Wednesday afternoon from 1:00 pm until 4:00  pm. You do not need an appointment for the interview!  [facility] [address], [place], [place] [phone]   [facility] Health Department  [phone]   [facility]  [phone]   [facility]  [phone]    Behavioral Health Resources in the Community: Intensive Outpatient Programs Organization         Address  Phone  Notes  [facility] [address], [place], [place] [phone]   [facility] [address] [place], [place] [phone]   [facility] [address] [place], [place]  [phone]   [facility] [address],  [place], [place] [phone] or [phone]   Substance Abuse Resources Organization         Address  Phone  Notes  [facility]  [phone]   [facility]  [phone]   [facility]  [phone]   Floydene Flock  [phone]   Residential & Outpatient Substance Abuse Program  [phone]   Psychological Services Organization         Address  Phone  Notes  [facility] Behavioral Health  [phone]    [facility]  [phone]   [facility] [address] [place] [phone] or [phone]    Mobile Crisis Teams Organization         Address  Phone  Notes  [facility], Mobile Crisis Care Unit  [phone]   [facility]  [address] [place], [place] [phone]   Doristine Locks [address] [place] [phone]    Self-Help/Support Groups Organization         Address  Phone             Notes  [facility] [place] - variety of support groups  336- I7437963 Call for more information  Narcotics Anonymous (NA), [facility] [address], [employer] [place]  2 meetings at this location   Statistician         Address  Phone  Notes  [facility] [address],    [place]  [phone]   [facility]  [address] [place] [address], [place], [place] [phone]   [facility] [address] [place], [place] [phone] Admissions: 8am-3pm M-F  [facility] [address]    [place], [place] [phone]   [facility] [address] [place], [place], [place] [phone]   [facility] [address],  [place], [place] [phone]   [facility] - Intensive Outpatient [address]ure[address], [place], [place] [phone]   [facility] ([facility] [address]  [place], [place] [phone] or [phone]   [facility] ([facility]) [address], [place], [place] [phone] Accepts Medicaid  [facility] [address],  [place] [phone] Substance Abuse/Addiction Treatment   [facility] Organization         Address  Phone  Notes  [facility]  [phone]   Angie Fava, PhD [address] Ervin Knack [place], [place]   [phone] or [phone]    [facility] Behavioral   [address] [place], [place] [phone]   [facility] [address], [place], [place] [phone] Insurance/Medicaid/sponsorship through [employer] and Families [address]  [place], [place] [phone] [place], [place] [phone]    Dr. Adele Schilder  [phone]   [facility] Dept. 1) [address] [place] 2) [place] 3)  [place], [place] [phone]  [phone]   [place] [phone] or [phone] (After Hours)

## 2015-05-15 NOTE — ED Notes (Signed)
Pt made aware to return if symptoms worsen or if any life threatening symptoms occur.   

## 2015-05-23 NOTE — ED Provider Notes (Signed)
CSN: [id number]     Arrival date & time 05/15/15  1116 History   First MD Initiated Contact with Patient 05/15/15 1131     Chief Complaint  Patient presents with  . Chest Pain     (Consider location/radiation/quality/duration/timing/severity/associated sxs/prior Treatment) HPI   34 year old female with chest pain. Onset about a month ago. Pain is in the center to slightly left side of her chest. Dull achy/burning pain. Often worse after he eating. No appreciable exacerbating relieving factors otherwise. No respiratory complaints. No fevers or chills. No unusual leg pain or swelling. No known history of coronary artery disease. No history of hypertension or diabetes.  Past Medical History  Diagnosis Date  . Herpes   . Missed period 11/01/2014  . Weight gain 11/01/2014  . Vaginal discharge 11/01/2014  . Patient desires pregnancy 11/01/2014  . 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 Irregular periods 01/31/2015   Past Surgical History  Procedure Laterality Date  . Reduction mammaplasty Right    Family History  Problem Relation Age of Onset  . Hypertension Mother   . Heart disease Mother   . Cancer Father     throat  . Congestive Heart Failure Brother   . Heart disease Maternal Grandmother   . Cancer Paternal Grandmother    Social History  Substance Use Topics  . Smoking status: Never Smoker   . Smokeless tobacco: Never Used  . Alcohol Use: No   OB History    Gravida Para Term Preterm AB TAB SAB Ectopic Multiple Living   0              Review of Systems  All systems reviewed and negative, other than as noted in HPI.   Allergies  Bee venom; Latex; and Penicillins  Home Medications   Prior to Admission medications   Medication Sig Start Date End Date Taking? Authorizing Provider  acetaminophen (TYLENOL) 325 MG tablet Take 650 mg by mouth every 6 (six) hours as needed for mild pain.   Yes Historical Provider, MD  clomiPHENE (CLOMID) 50 MG tablet TAKE 1  TABLET BY MOUTH DAYS 3 THROUGH 7 OF THE CYCLE 04/29/15  Yes Adline Potter, NP  Multiple Vitamin (MULTIVITAMIN WITH MINERALS) TABS tablet Take 1 tablet by mouth daily.   Yes Historical Provider, MD  pantoprazole (PROTONIX) 20 MG tablet Take 1 tablet (20 mg total) by mouth 2 (two) times daily. 05/15/15   Raeford Razor, MD   BP 119/82 mmHg  Pulse 65  Temp(Src) 98.3 F (36.8 C) (Oral)  Resp 22  Ht 5\' 6"  (1.676 m)  Wt 160 lb (72.576 kg)  BMI 25.84 kg/m2  SpO2 100%  LMP 05/01/2015 Physical Exam  Constitutional: She appears well-developed and well-nourished. No distress.  HENT:  Head: Normocephalic and atraumatic.  Eyes: Conjunctivae are normal. Right eye exhibits no discharge. Left eye exhibits no discharge.  Neck: Neck supple.  Cardiovascular: Normal rate, regular rhythm and normal heart sounds.  Exam reveals no gallop and no friction rub.   No murmur heard. Pulmonary/Chest: Effort normal and breath sounds normal. No respiratory distress.  Abdominal: Soft. She exhibits no distension. There is no tenderness.  Musculoskeletal: She exhibits no edema or tenderness.  Neurological: She is alert.  Skin: Skin is warm and dry.  Psychiatric: She has a normal mood and affect. Her behavior is normal. Thought content normal.  Nursing note and vitals reviewed.   ED Course  Procedures (including critical care time) Labs Review Labs Reviewed -  No data to display  Imaging Review No results found. I have personally reviewed and evaluated these images and lab results as part of my medical decision-making.   EKG Interpretation   Date/Time:  Wednesday May 15 2015 11:28:42 EDT Ventricular Rate:  67 PR Interval:  105 QRS Duration: 89 QT Interval:  397 QTC Calculation: 419 R Axis:   32 Text Interpretation:  Sinus rhythm Short PR interval No significant change  since last tracing Confirmed by Brindle Leyba  MD, Malaak Stach ([id number]) on 05/15/2015  1:10:11 PM      MDM   Final diagnoses:   Gastroesophageal reflux disease, esophagitis presence not specified    34 year old female with chest pain. Symptoms seems most consistent with GI etiology such as gastroesophageal reflux. Pain is often worse after eating. Generally atypical for ACS. Doubt pulmonary embolism, serious infection, dissection or other emergent process. It has been determined that no acute conditions requiring further emergency intervention are present at this time. The patient has been advised of the diagnosis and plan. I reviewed any labs and imaging including any potential incidental findings. We have discussed signs and symptoms that warrant return to the ED and they are listed in the discharge instructions.      Raeford Razor, MD 05/23/15 [phone]

## 2015-07-05 DIAGNOSIS — F32A Depression, unspecified: Secondary | ICD-10-CM | POA: Insufficient documentation

## 2015-07-05 DIAGNOSIS — R42 Dizziness and giddiness: Secondary | ICD-10-CM | POA: Insufficient documentation

## 2015-07-05 DIAGNOSIS — E559 Vitamin D deficiency, unspecified: Secondary | ICD-10-CM | POA: Insufficient documentation

## 2015-07-05 DIAGNOSIS — E538 Deficiency of other specified B group vitamins: Secondary | ICD-10-CM | POA: Insufficient documentation

## 2015-07-05 DIAGNOSIS — R102 Pelvic and perineal pain: Secondary | ICD-10-CM | POA: Insufficient documentation

## 2015-07-05 DIAGNOSIS — F329 Major depressive disorder, single episode, unspecified: Secondary | ICD-10-CM | POA: Insufficient documentation

## 2015-07-05 DIAGNOSIS — R5383 Other fatigue: Secondary | ICD-10-CM | POA: Insufficient documentation

## 2015-10-29 ENCOUNTER — Encounter: Payer: Self-pay | Admitting: Emergency Medicine

## 2015-10-29 ENCOUNTER — Emergency Department
Admission: EM | Admit: 2015-10-29 | Discharge: 2015-10-29 | Disposition: A | Discharge status: Home / Self Care | Payer: BLUE CROSS/BLUE SHIELD | Attending: Emergency Medicine | Admitting: Emergency Medicine

## 2015-10-29 DIAGNOSIS — R5383 Other fatigue: Secondary | ICD-10-CM | POA: Insufficient documentation

## 2015-10-29 DIAGNOSIS — Z3202 Encounter for pregnancy test, result negative: Secondary | ICD-10-CM | POA: Insufficient documentation

## 2015-10-29 DIAGNOSIS — R102 Pelvic and perineal pain: Secondary | ICD-10-CM | POA: Insufficient documentation

## 2015-10-29 LAB — POCT PREGNANCY, URINE: Preg Test, Ur: NEGATIVE

## 2015-10-29 LAB — URINALYSIS COMPLETE WITH MICROSCOPIC (ARMC ONLY)
Bacteria, UA: NONE SEEN
Bilirubin Urine: NEGATIVE
Glucose, UA: NEGATIVE mg/dL
HGB URINE DIPSTICK: NEGATIVE
Ketones, ur: NEGATIVE mg/dL
Nitrite: NEGATIVE
PH: 7 (ref 5.0–8.0)
Protein, ur: NEGATIVE mg/dL
RBC / HPF: NONE SEEN RBC/hpf (ref 0–5)
SPECIFIC GRAVITY, URINE: 1.018 (ref 1.005–1.030)

## 2015-10-29 LAB — CBC
HCT: 35.6 % (ref 35.0–47.0)
HEMOGLOBIN: 12.2 g/dL (ref 12.0–16.0)
MCH: 29.3 pg (ref 26.0–34.0)
MCHC: 34.4 g/dL (ref 32.0–36.0)
MCV: 85.3 fL (ref 80.0–100.0)
Platelets: 276 10*3/uL (ref 150–440)
RBC: 4.17 MIL/uL (ref 3.80–5.20)
RDW: 13.4 % (ref 11.5–14.5)
WBC: 10.7 10*3/uL (ref 3.6–11.0)

## 2015-10-29 LAB — BASIC METABOLIC PANEL
ANION GAP: 6 (ref 5–15)
BUN: 10 mg/dL (ref 6–20)
CO2: 24 mmol/L (ref 22–32)
Calcium: 9.3 mg/dL (ref 8.9–10.3)
Chloride: 109 mmol/L (ref 101–111)
Creatinine, Ser: 0.7 mg/dL (ref 0.44–1.00)
GFR calc non Af Amer: >60 mL/min (ref >60)
Glucose, Bld: 102 mg/dL — ABNORMAL HIGH (ref 65–99)
POTASSIUM: 3.8 mmol/L (ref 3.5–5.1)
Sodium: 139 mmol/L (ref 135–145)

## 2015-10-29 NOTE — ED Notes (Signed)
Called for pt in waiting room x 3.  No answer.   

## 2015-10-29 NOTE — ED Notes (Signed)
Patient presents to the ED with lethargy x 1.5 weeks and an increase in vaginal pain.  Patient states she has the herpes simplex virus and has had pain since her diagnosis in 2008 but she has been taking valtrex and not had pain relief.  Patient denies any lesions.  Patient reports burning and tingling sensation.  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  Patient denies any other new symptoms.

## 2015-11-25 ENCOUNTER — Other Ambulatory Visit: Payer: Self-pay | Admitting: Adult Health

## 2015-12-03 ENCOUNTER — Other Ambulatory Visit: Payer: Self-pay | Admitting: Adult Health

## 2016-04-01 ENCOUNTER — Encounter: Payer: Self-pay | Admitting: Emergency Medicine

## 2016-04-01 ENCOUNTER — Emergency Department
Admission: EM | Admit: 2016-04-01 | Discharge: 2016-04-01 | Disposition: A | Discharge status: Home / Self Care | Payer: BLUE CROSS/BLUE SHIELD | Attending: Emergency Medicine | Admitting: Emergency Medicine

## 2016-04-01 DIAGNOSIS — M546 Pain in thoracic spine: Secondary | ICD-10-CM

## 2016-04-01 DIAGNOSIS — Z91040 Latex allergy status: Secondary | ICD-10-CM | POA: Insufficient documentation

## 2016-04-01 DIAGNOSIS — M62830 Muscle spasm of back: Secondary | ICD-10-CM | POA: Insufficient documentation

## 2016-04-01 DIAGNOSIS — Z79899 Other long term (current) drug therapy: Secondary | ICD-10-CM | POA: Insufficient documentation

## 2016-04-01 MED ORDER — NAPROXEN 500 MG PO TABS: 500.0000 mg | ORAL_TABLET | Freq: Two times a day (BID) | ORAL | Status: DC

## 2016-04-01 MED ORDER — CYCLOBENZAPRINE HCL 10 MG PO TABS: 10.0000 mg | ORAL_TABLET | Freq: Three times a day (TID) | ORAL | Status: DC | PRN

## 2016-04-01 NOTE — Discharge Instructions (Signed)
Back Exercises °The following exercises strengthen the muscles that help to support the back. They also help to keep the lower back flexible. Doing these exercises can help to prevent back pain or lessen existing pain. °If you have back pain or discomfort, try doing these exercises 2-3 times each day or as told by your health care provider. When the pain goes away, do them once each day, but increase the number of times that you repeat the steps for each exercise (do more repetitions). If you do not have back pain or discomfort, do these exercises once each day or as told by your health care provider. °EXERCISES °Single Knee to Chest °Repeat these steps 3-5 times for each leg: °· Lie on your back on a firm bed or the floor with your legs extended. °· Bring one knee to your chest. Your other leg should stay extended and in contact with the floor. °· Hold your knee in place by grabbing your knee or thigh. °· Pull on your knee until you feel a gentle stretch in your lower back. °· Hold the stretch for 10-30 seconds. °· Slowly release and straighten your leg. °Pelvic Tilt °Repeat these steps 5-10 times: °· Lie on your back on a firm bed or the floor with your legs extended. °· Bend your knees so they are pointing toward the ceiling and your feet are flat on the floor. °· Tighten your lower abdominal muscles to press your lower back against the floor. This motion will tilt your pelvis so your tailbone points up toward the ceiling instead of pointing to your feet or the floor. °· With gentle tension and even breathing, hold this position for 5-10 seconds. °Cat-Cow °Repeat these steps until your lower back becomes more flexible: °· Get into a hands-and-knees position on a firm surface. Keep your hands under your shoulders, and keep your knees under your hips. You may place padding under your knees for comfort. °· Let your head hang down, and point your tailbone toward the floor so your lower back becomes rounded like the  back of a cat. °· Hold this position for 5 seconds. °· Slowly lift your head and point your tailbone up toward the ceiling so your back forms a sagging arch like the back of a cow. °· Hold this position for 5 seconds. °Press-Ups °Repeat these steps 5-10 times: °· Lie on your abdomen (face-down) on the floor. °· Place your palms near your head, about shoulder-width apart. °· While you keep your back as relaxed as possible and keep your hips on the floor, slowly straighten your arms to raise the top half of your body and lift your shoulders. Do not use your back muscles to raise your upper torso. You may adjust the placement of your hands to make yourself more comfortable. °· Hold this position for 5 seconds while you keep your back relaxed. °· Slowly return to lying flat on the floor. °Bridges °Repeat these steps 10 times: °1. Lie on your back on a firm surface. °2. Bend your knees so they are pointing toward the ceiling and your feet are flat on the floor. °3. Tighten your buttocks muscles and lift your buttocks off of the floor until your waist is at almost the same height as your knees. You should feel the muscles working in your buttocks and the back of your thighs. If you do not feel these muscles, slide your feet 1-2 inches farther away from your buttocks. °4. Hold this position for 3-5   seconds. 5. Slowly lower your hips to the starting position, and allow your buttocks muscles to relax completely. If this exercise is too easy, try doing it with your arms crossed over your chest. Abdominal Crunches Repeat these steps 5-10 times: 1. Lie on your back on a firm bed or the floor with your legs extended. 2. Bend your knees so they are pointing toward the ceiling and your feet are flat on the floor. 3. Cross your arms over your chest. 4. Tip your chin slightly toward your chest without bending your neck. 5. Tighten your abdominal muscles and slowly raise your trunk (torso) high enough to lift your shoulder  blades a tiny bit off of the floor. Avoid raising your torso higher than that, because it can put too much stress on your low back and it does not help to strengthen your abdominal muscles. 6. Slowly return to your starting position. Back Lifts Repeat these steps 5-10 times: 1. Lie on your abdomen (face-down) with your arms at your sides, and rest your forehead on the floor. 2. Tighten the muscles in your legs and your buttocks. 3. Slowly lift your chest off of the floor while you keep your hips pressed to the floor. Keep the back of your head in line with the curve in your back. Your eyes should be looking at the floor. 4. Hold this position for 3-5 seconds. 5. Slowly return to your starting position. SEEK MEDICAL CARE IF:  Your back pain or discomfort gets much worse when you do an exercise.  Your back pain or discomfort does not lessen within 2 hours after you exercise. If you have any of these problems, stop doing these exercises right away. Do not do them again unless your health care provider says that you can. SEEK IMMEDIATE MEDICAL CARE IF:  You develop sudden, severe back pain. If this happens, stop doing the exercises right away. Do not do them again unless your health care provider says that you can.   This information is not intended to replace advice given to you by your health care provider. Make sure you discuss any questions you have with your health care provider.   Document Released: 10/22/2004 Document Revised: 06/05/2015 Document Reviewed: 11/08/2014 Elsevier Interactive Patient Education 2016 Elsevier Inc.  Back Pain, Adult Back pain is very common. The pain often gets better over time. The cause of back pain is usually not dangerous. Most people can learn to manage their back pain on their own.  HOME CARE  Watch your back pain for any changes. The following actions may help to lessen any pain you are feeling:  Stay active. Start with short walks on flat ground if  you can. Try to walk farther each day.  Exercise regularly as told by your doctor. Exercise helps your back heal faster. It also helps avoid future injury by keeping your muscles strong and flexible.  Do not sit, drive, or stand in one place for more than 30 minutes.  Do not stay in bed. Resting more than 1-2 days can slow down your recovery.  Be careful when you bend or lift an object. Use good form when lifting:  Bend at your knees.  Keep the object close to your body.  Do not twist.  Sleep on a firm mattress. Lie on your side, and bend your knees. If you lie on your back, put a pillow under your knees.  Take medicines only as told by your doctor.  Put ice on the  injured area.  Put ice in a plastic bag.  Place a towel between your skin and the bag.  Leave the ice on for 20 minutes, 2-3 times a day for the first 2-3 days. After that, you can switch between ice and heat packs.  Avoid feeling anxious or stressed. Find good ways to deal with stress, such as exercise.  Maintain a healthy weight. Extra weight puts stress on your back. GET HELP IF:   You have pain that does not go away with rest or medicine.  You have worsening pain that goes down into your legs or buttocks.  You have pain that does not get better in one week.  You have pain at night.  You lose weight.  You have a fever or chills. GET HELP RIGHT AWAY IF:   You cannot control when you poop (bowel movement) or pee (urinate).  Your arms or legs feel weak.  Your arms or legs lose feeling (numbness).  You feel sick to your stomach (nauseous) or throw up (vomit).  You have belly (abdominal) pain.  You feel like you may pass out (faint).   This information is not intended to replace advice given to you by your health care provider. Make sure you discuss any questions you have with your health care provider.   Document Released: 03/02/2008 Document Revised: 10/05/2014 Document Reviewed:  01/16/2014 Elsevier Interactive Patient Education 2016 Elsevier Inc.  [employer] LockerHeat Therapy Heat therapy can help ease sore, stiff, injured, and tight muscles and joints. Heat relaxes your muscles, which may help ease your pain. Heat therapy should only be used on old, pre-existing, or long-lasting (chronic) injuries. Do not use heat therapy unless told by your doctor. HOW TO USE HEAT THERAPY There are several different kinds of heat therapy, including:  Moist heat pack.  Warm water bath.  Hot water bottle.  Electric heating pad.  Heated gel pack.  Heated wrap.  Electric heating pad. GENERAL HEAT THERAPY RECOMMENDATIONS   Do not sleep while using heat therapy. Only use heat therapy while you are awake.  Your skin may turn pink while using heat therapy. Do not use heat therapy if your skin turns red.  Do not use heat therapy if you have new pain.  High heat or long exposure to heat can cause burns. Be careful when using heat therapy to avoid burning your skin.  Do not use heat therapy on areas of your skin that are already irritated, such as with a rash or sunburn. GET HELP IF:   You have blisters, redness, swelling (puffiness), or numbness.  You have new pain.  Your pain is worse. MAKE SURE YOU:  Understand these instructions.  Will watch your condition.  Will get help right away if you are not doing well or get worse.   This information is not intended to replace advice given to you by your health care provider. Make sure you discuss any questions you have with your health care provider.   Document Released: 12/07/2011 Document Revised: 10/05/2014 Document Reviewed: 11/07/2013 Elsevier Interactive Patient Education 2016 Elsevier Inc.  Muscle Cramps and Spasms Muscle cramps and spasms are when muscles tighten by themselves. They usually get better within minutes. Muscle cramps are painful. They are usually stronger and last longer than muscle spasms. Muscle spasms may or  may not be painful. They can last a few seconds or much longer. HOME CARE  Drink enough fluid to keep your pee (urine) clear or pale yellow.  Massage, stretch, and  relax the muscle.  Use a warm towel, heating pad, or warm shower water on tight muscles.  Place ice on the muscle if it is tender or in pain.  Put ice in a plastic bag.  Place a towel between your skin and the bag.  Leave the ice on for 15-20 minutes, 03-04 times a day.  Only take medicine as told by your doctor. GET HELP RIGHT AWAY IF:  Your cramps or spasms get worse, happen more often, or do not get better with time. MAKE SURE YOU:  Understand these instructions.  Will watch your condition.  Will get help right away if you are not doing well or get worse.   This information is not intended to replace advice given to you by your health care provider. Make sure you discuss any questions you have with your health care provider.   Document Released: 08/27/2008 Document Revised: 01/09/2013 Document Reviewed: 08/31/2012 Elsevier Interactive Patient Education 2016 Elsevier Inc.  Musculoskeletal Pain Musculoskeletal pain is muscle and boney aches and pains. These pains can occur in any part of the body. Your caregiver may treat you without knowing the cause of the pain. They may treat you if blood or urine tests, X-rays, and other tests were normal.  CAUSES There is often not a definite cause or reason for these pains. These pains may be caused by a type of germ (virus). The discomfort may also come from overuse. Overuse includes working out too hard when your body is not fit. Boney aches also come from weather changes. Bone is sensitive to atmospheric pressure changes. HOME CARE INSTRUCTIONS   Ask when your test results will be ready. Make sure you get your test results.  Only take over-the-counter or prescription medicines for pain, discomfort, or fever as directed by your caregiver. If you were given medications for  your condition, do not drive, operate machinery or power tools, or sign legal documents for 24 hours. Do not drink alcohol. Do not take sleeping pills or other medications that may interfere with treatment.  Continue all activities unless the activities cause more pain. When the pain lessens, slowly resume normal activities. Gradually increase the intensity and duration of the activities or exercise.  During periods of severe pain, bed rest may be helpful. Lay or sit in any position that is comfortable.  Putting ice on the injured area.  Put ice in a bag.  Place a towel between your skin and the bag.  Leave the ice on for 15 to 20 minutes, 3 to 4 times a day.  Follow up with your caregiver for continued problems and no reason can be found for the pain. If the pain becomes worse or does not go away, it may be necessary to repeat tests or do additional testing. Your caregiver may need to look further for a possible cause. SEEK IMMEDIATE MEDICAL CARE IF:  You have pain that is getting worse and is not relieved by medications.  You develop chest pain that is associated with shortness or breath, sweating, feeling sick to your stomach (nauseous), or throw up (vomit).  Your pain becomes localized to the abdomen.  You develop any new symptoms that seem different or that concern you. MAKE SURE YOU:   Understand these instructions.  Will watch your condition.  Will get help right away if you are not doing well or get worse.   This information is not intended to replace advice given to you by your health care provider. Make sure  you discuss any questions you have with your health care provider.   Document Released: 09/14/2005 Document Revised: 12/07/2011 Document Reviewed: 05/19/2013 Elsevier Interactive Patient Education [employer]2016 Elsevier Inc.

## 2016-04-01 NOTE — ED Provider Notes (Signed)
[facility] Emergency Department Provider Note  ____________________________________________  Time seen: Approximately 5:10 PM  I have reviewed the triage vital signs and the nursing notes.   HISTORY  Chief Complaint Back Pain    HPI Unk [name] is a 35 y.o. female , NAD, presents to the emergency department with 1 month history of neck and back pain. States that the pain has been aching and feels tight and began since starting a job that requires laborious lifting. States she can feel some tingling in her upper back at times. Reports that she has been in discussions with her supervisor at work and there is a plan to move the patient to a different apartment in which she will be lifting less. Denies any specific injuries, traumas, falls affecting the back. Has not had any numbness, weakness. Denies saddle paresthesias nor loss of bowel or bladder control. Has not had any changes in her urinary or bowel habits. No rash, skin lesions, redness, swelling, or bruising.    Past Medical History  Diagnosis Date  . Herpes   . Missed period 11/01/2014  . Weight gain 11/01/2014  . Vaginal discharge 11/01/2014  . Patient desires pregnancy 11/01/2014  . 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 Irregular periods 01/31/2015    Patient Active Problem List   Diagnosis Date Noted  . Irregular periods 01/31/2015  . 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 Missed period 11/01/2014  . Weight gain 11/01/2014  . Vaginal discharge 11/01/2014  . Patient desires pregnancy 11/01/2014    Past Surgical History  Procedure Laterality Date  . Reduction mammaplasty Right     Current Outpatient Rx  Name  Route  Sig  Dispense  Refill  . acetaminophen (TYLENOL) 325 MG tablet   Oral   Take 650 mg by mouth every 6 (six) hours as needed for mild pain.         . clomiPHENE (CLOMID) 50 MG tablet      TAKE 1 TABLET BY MOUTH DAYS 3 THROUGH 7 OF THE CYCLE   5  tablet   0   . cyclobenzaprine (FLEXERIL) 10 MG tablet   Oral   Take 1 tablet (10 mg total) by mouth 3 (three) times daily as needed for muscle spasms.   21 tablet   0   . Multiple Vitamin (MULTIVITAMIN WITH MINERALS) TABS tablet   Oral   Take 1 tablet by mouth daily.         . naproxen (NAPROSYN) 500 MG tablet   Oral   Take 1 tablet (500 mg total) by mouth 2 (two) times daily with a meal.   14 tablet   0   . pantoprazole (PROTONIX) 20 MG tablet   Oral   Take 1 tablet (20 mg total) by mouth 2 (two) times daily.   60 tablet   0   . valACYclovir (VALTREX) 1000 MG tablet      TAKE 1 TABLET BY MOUTH DAILY.   30 tablet   6     Allergies Bee venom; Latex; and Penicillins  Family History  Problem Relation Age of Onset  . Hypertension Mother   . Heart disease Mother   . Cancer Father     throat  . Congestive Heart Failure Brother   . Heart disease Maternal Grandmother   . Cancer Paternal Grandmother     Social History Social History  Substance Use Topics  . Smoking status: Never Smoker   .  Smokeless tobacco: Never Used  . Alcohol Use: No     Review of Systems  Constitutional: No fatigue Eyes: No visual changes.  Cardiovascular: No chest pain. Respiratory: No shortness of breath.  Gastrointestinal: No abdominal pain.  No nausea, vomiting.  No diarrhea, constipation Genitourinary: Negative for dysuria, hematuria. No urinary hesitancy, urgency or increased frequency. Musculoskeletal: Positive for back pain.  Skin: Negative for rash, redness, swelling, bruising, skin sores. Neurological: Positive tingling. Negative for headaches, focal weakness or numbness. 10-point ROS otherwise negative.  ____________________________________________   PHYSICAL EXAM:  VITAL SIGNS: ED Triage Vitals  Enc Vitals Group     BP 04/01/16 1649 118/76 mmHg     Pulse Rate 04/01/16 1649 72     Resp 04/01/16 1649 20     Temp 04/01/16 1649 99.1 F (37.3 C)     Temp Source  04/01/16 1649 Oral     SpO2 04/01/16 1649 98 %     Weight 04/01/16 1649 170 lb (77.111 kg)     Height 04/01/16 1649 5\' 6"  (1.676 m)     Head Cir --      Peak Flow --      Pain Score 04/01/16 1649 8     Pain Loc --      Pain Edu? --      Excl. in GC? --      Constitutional: Alert and oriented. Well appearing and in no acute distress. Eyes: Conjunctivae are normal.  Head: Atraumatic. Neck: No cervical spine nor paraspinal tenderness to palpation. Supple with full range of motion. Mild trapezial spasm noted on the left with tenderness to palpation. Hematological/Lymphatic/Immunilogical: No cervical lymphadenopathy. Cardiovascular:  Good peripheral circulation with 2+ pulses noted in bilateral upper and lower extremities Respiratory: Normal respiratory effort without tachypnea or retractions.  Musculoskeletal: No central spine tenderness to palpation about the thoracic or lumbar spine. Mild thoracic paraspinal muscle spasm noted to palpation on the left with mild discomfort to palpation. No lower extremity tenderness nor edema.  No joint effusions. Negative straight leg raise bilaterally. Neurologic:  Normal speech and language. No gross focal neurologic deficits are appreciated. Sensation to light touch grossly intact about bilateral upper and lower extremities Skin:  Skin is warm, dry and intact. No rash, redness, swelling, bruising, skin sores noted. Psychiatric: Mood and affect are normal. Speech and behavior are normal. Patient exhibits appropriate insight and judgement.   ____________________________________________   LABS  None ____________________________________________  EKG  None ____________________________________________  RADIOLOGY  None ____________________________________________    PROCEDURES  Procedure(s) performed: None    Medications - No data to display   ____________________________________________   INITIAL IMPRESSION / ASSESSMENT AND PLAN /  ED COURSE  Patient's diagnosis is consistent with muscle spasm of back and bilateral thoracic back pain. Patient will be discharged home with prescriptions for Flexeril and Naprosyn to take as directed. Patient given a care in regards to back exercises in which she should complete on a daily basis. Advised light range of motion and stretching exercises 2-3 times daily. May apply warm heat to the affected areas 20 minutes 3-4 times daily as needed. Patient is to follow up with her primary care provider if symptoms persist past this treatment course. Patient is given ED precautions to return to the ED for any worsening or new symptoms.    ____________________________________________  FINAL CLINICAL IMPRESSION(S) / ED DIAGNOSES  Final diagnoses:  Muscle spasm of back  Bilateral thoracic back pain      NEW MEDICATIONS STARTED  DURING THIS VISIT:  New Prescriptions   CYCLOBENZAPRINE (FLEXERIL) 10 MG TABLET    Take 1 tablet (10 mg total) by mouth 3 (three) times daily as needed for muscle spasms.   NAPROXEN (NAPROSYN) 500 MG TABLET    Take 1 tablet (500 mg total) by mouth 2 (two) times daily with a meal.         Hope Pigeon, PA-C 04/01/16 1741  Sharman Cheek, MD 04/01/16 2003

## 2016-04-01 NOTE — ED Notes (Signed)
Patient presents to the ED with lower back pain and spasms x 1.5 weeks.  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  Patient states she noticed the back pain after starting this new job.  Patient denies dysuria.  Patient is sitting relaxed during triage, no obvious distress at this time.

## 2016-04-01 NOTE — ED Notes (Signed)
Pt in via triage with complaints of back pain x 1 month.  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  Pt reports working at [employer] but through [employer].  Pt is unsure as to whether she would like to file worker's comp at this time.  Pt A/Ox4, no immediate distress at this time.

## 2016-10-25 ENCOUNTER — Encounter (HOSPITAL_COMMUNITY): Payer: Self-pay | Admitting: Emergency Medicine

## 2016-10-25 ENCOUNTER — Emergency Department (HOSPITAL_COMMUNITY): Payer: Self-pay

## 2016-10-25 ENCOUNTER — Emergency Department (HOSPITAL_COMMUNITY)
Admission: EM | Admit: 2016-10-25 | Discharge: 2016-10-25 | Disposition: A | Discharge status: Home / Self Care | Payer: Self-pay | Attending: Emergency Medicine | Admitting: Emergency Medicine

## 2016-10-25 DIAGNOSIS — K5900 Constipation, unspecified: Secondary | ICD-10-CM | POA: Insufficient documentation

## 2016-10-25 DIAGNOSIS — N760 Acute vaginitis: Secondary | ICD-10-CM | POA: Insufficient documentation

## 2016-10-25 DIAGNOSIS — B9689 Other specified bacterial agents as the cause of diseases classified elsewhere: Secondary | ICD-10-CM

## 2016-10-25 DIAGNOSIS — Z79899 Other long term (current) drug therapy: Secondary | ICD-10-CM | POA: Insufficient documentation

## 2016-10-25 DIAGNOSIS — R109 Unspecified abdominal pain: Secondary | ICD-10-CM

## 2016-10-25 HISTORY — DX: Urethral syndrome, unspecified: N34.3

## 2016-10-25 HISTORY — DX: Other specified disorders of muscle: M62.89

## 2016-10-25 HISTORY — DX: Pelvic and perineal pain: R10.2

## 2016-10-25 HISTORY — DX: Other chronic pain: G89.29

## 2016-10-25 LAB — CBC WITH DIFFERENTIAL/PLATELET
Basophils Absolute: 0 10*3/uL (ref 0.0–0.1)
Basophils Relative: 0 %
EOS ABS: 0.1 10*3/uL (ref 0.0–0.7)
EOS PCT: 1 %
HCT: 35 % — ABNORMAL LOW (ref 36.0–46.0)
Hemoglobin: 11.6 g/dL — ABNORMAL LOW (ref 12.0–15.0)
Lymphocytes Relative: 27 %
Lymphs Abs: 2.5 10*3/uL (ref 0.7–4.0)
MCH: 28.9 pg (ref 26.0–34.0)
MCHC: 33.1 g/dL (ref 30.0–36.0)
MCV: 87.1 fL (ref 78.0–100.0)
MONO ABS: 0.5 10*3/uL (ref 0.1–1.0)
Monocytes Relative: 6 %
Neutro Abs: 6.4 10*3/uL (ref 1.7–7.7)
Neutrophils Relative %: 66 %
Platelets: 250 10*3/uL (ref 150–400)
RBC: 4.02 MIL/uL (ref 3.87–5.11)
RDW: 12.6 % (ref 11.5–15.5)
WBC: 9.6 10*3/uL (ref 4.0–10.5)

## 2016-10-25 LAB — COMPREHENSIVE METABOLIC PANEL
ALK PHOS: 62 U/L (ref 38–126)
ALT: 18 U/L (ref 14–54)
AST: 20 U/L (ref 15–41)
Albumin: 3.8 g/dL (ref 3.5–5.0)
Anion gap: 5 (ref 5–15)
BUN: 15 mg/dL (ref 6–20)
CO2: 27 mmol/L (ref 22–32)
CREATININE: 0.94 mg/dL (ref 0.44–1.00)
Calcium: 8.9 mg/dL (ref 8.9–10.3)
Chloride: 106 mmol/L (ref 101–111)
GFR calc Af Amer: >60 mL/min (ref >60)
GFR calc non Af Amer: >60 mL/min (ref >60)
GLUCOSE: 106 mg/dL — AB (ref 65–99)
Potassium: 3.7 mmol/L (ref 3.5–5.1)
Sodium: 138 mmol/L (ref 135–145)
Total Bilirubin: 0.6 mg/dL (ref 0.3–1.2)
Total Protein: 7.2 g/dL (ref 6.5–8.1)

## 2016-10-25 LAB — PREGNANCY, URINE: Preg Test, Ur: NEGATIVE

## 2016-10-25 LAB — URINALYSIS, ROUTINE W REFLEX MICROSCOPIC
BILIRUBIN URINE: NEGATIVE
Glucose, UA: NEGATIVE mg/dL
Hgb urine dipstick: NEGATIVE
Ketones, ur: NEGATIVE mg/dL
Leukocytes, UA: NEGATIVE
Nitrite: NEGATIVE
PH: 6 (ref 5.0–8.0)
Protein, ur: NEGATIVE mg/dL
SPECIFIC GRAVITY, URINE: 1.015 (ref 1.005–1.030)

## 2016-10-25 LAB — WET PREP, GENITAL
Sperm: NONE SEEN
Trich, Wet Prep: NONE SEEN
YEAST WET PREP: NONE SEEN

## 2016-10-25 LAB — LIPASE, BLOOD: Lipase: 19 U/L (ref 11–51)

## 2016-10-25 MED ORDER — METRONIDAZOLE 500 MG PO TABS: 500.0000 mg | ORAL_TABLET | Freq: Two times a day (BID) | ORAL | 0 refills | Status: DC

## 2016-10-25 MED ORDER — GI COCKTAIL ~~LOC~~
30.0000 mL | Freq: Once | ORAL | Status: AC
  Administered 2016-10-25: 30 mL via ORAL
  Filled 2016-10-25: qty 30

## 2016-10-25 NOTE — Discharge Instructions (Signed)
Take over the counter laxative (such as miralax, milk of magnesia, senokot) AND a dulcolax suppository today and repeat both tomorrow.  Begin to take over the counter stool softener (colace), as directed on packaging, for the next month.  Take the prescription as directed.  Call your regular medical doctor and your OB/GYN doctor tomorrow to schedule a follow up appointment this week.  Return to the Emergency Department immediately if worsening.

## 2016-10-25 NOTE — ED Notes (Signed)
Pt states she feels like she is having an outbreak of her herpes "but it feels internal"

## 2016-10-25 NOTE — ED Notes (Signed)
Pt reports that she has been feeling "horrible" since Friday- Dr Judie PetitM has evaluated and pt has given a urine specimen

## 2016-10-25 NOTE — ED Triage Notes (Signed)
Pt c/o all abd pain x 2 days. deneis n/v/d. deneis vag dc.burns with urianation. Mm moist. Nad.

## 2016-10-25 NOTE — ED Notes (Signed)
Call to xray, they are coming for pt now

## 2016-10-25 NOTE — ED Provider Notes (Signed)
[facility]-EMERGENCY DEPT Provider Note   CSN: [id number] Arrival date & time: 10/25/16  1557     History   Chief Complaint Chief Complaint  Patient presents with  . Abdominal Pain    HPI Rachel Stevens is a 36 y.o. female.  HPI  Pt was seen at 1620. Per pt, c/o gradual onset and persistence of constant generalized abd "pain" for the past 2 to 3 days. Describes the pain as "burning." Pt questions if "the herpes pain I have can travel up into my abd." Pt has longstanding issue with chronic pelvic and vulvar pain; evaluated by Hoyle Sauer MD for same. Pt endorses "some" burning with urination. Denies herpetic lesions/outbreak. Denies N/V/D, no fevers, no flank/back pain, no vaginal discharge/bleeding, no hematuria, no CP/SOB.   Past Medical History:  Diagnosis Date  . Chronic pelvic pain in female   . Herpes   . 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 Irregular periods 01/31/2015  . Missed period 11/01/2014  . Patient desires pregnancy 11/01/2014  . Pelvic floor tension   . Vaginal discharge 11/01/2014  . Vulvar pain   . Weight gain 11/01/2014    Patient Active Problem List   Diagnosis Date Noted  . Irregular periods 01/31/2015  . 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 Missed period 11/01/2014  . Weight gain 11/01/2014  . Vaginal discharge 11/01/2014  . Patient desires pregnancy 11/01/2014    Past Surgical History:  Procedure Laterality Date  . REDUCTION MAMMAPLASTY Right     OB History    Gravida Para Term Preterm AB Living   0             SAB TAB Ectopic Multiple Live Births                   Home Medications    Prior to Admission medications   Medication Sig Start Date End Date Taking? Authorizing Provider  acetaminophen (TYLENOL) 325 MG tablet Take 650 mg by mouth every 6 (six) hours as needed for mild pain.    Historical Provider, MD  clomiPHENE (CLOMID) 50 MG tablet TAKE 1 TABLET BY MOUTH DAYS 3 THROUGH 7 OF THE CYCLE 04/29/15   Adline Potter, NP  cyclobenzaprine (FLEXERIL) 10 MG tablet Take 1 tablet (10 mg total) by mouth 3 (three) times daily as needed for muscle spasms. 04/01/16   Jami L Hagler, PA-C  Multiple Vitamin (MULTIVITAMIN WITH MINERALS) TABS tablet Take 1 tablet by mouth daily.    Historical Provider, MD  naproxen (NAPROSYN) 500 MG tablet Take 1 tablet (500 mg total) by mouth 2 (two) times daily with a meal. 04/01/16   Jami L Hagler, PA-C  pantoprazole (PROTONIX) 20 MG tablet Take 1 tablet (20 mg total) by mouth 2 (two) times daily. 05/15/15   Raeford Razor, MD  valACYclovir (VALTREX) 1000 MG tablet TAKE 1 TABLET BY MOUTH DAILY. 11/25/15   Adline Potter, NP    Family History Family History  Problem Relation Age of Onset  . Hypertension Mother   . Heart disease Mother   . Cancer Father     throat  . Congestive Heart Failure Brother   . Heart disease Maternal Grandmother   . Cancer Paternal Grandmother     Social History Social History  Substance Use Topics  . Smoking status: Never Smoker  . Smokeless tobacco: Never Used  . Alcohol use No     Allergies   Bee venom; Latex; and Penicillins  Review of Systems Review of Systems ROS: Statement: All systems negative except as marked or noted in the HPI; Constitutional: Negative for fever and chills. ; ; Eyes: Negative for eye pain, redness and discharge. ; ; ENMT: Negative for ear pain, hoarseness, nasal congestion, sinus pressure and sore throat. ; ; Cardiovascular: Negative for chest pain, palpitations, diaphoresis, dyspnea and peripheral edema. ; ; Respiratory: Negative for cough, wheezing and stridor. ; ; Gastrointestinal: +abd pain. Negative for nausea, vomiting, diarrhea, blood in stool, hematemesis, jaundice and rectal bleeding. . ; ; Genitourinary: +dysuria. Negative for flank pain and hematuria.; ; GYN:  +chronic vulvar pain, no vaginal bleeding, no vaginal discharge. ;; Musculoskeletal: Negative for back pain and neck pain. Negative for swelling  and trauma.; ; Skin: Negative for pruritus, rash, abrasions, blisters, bruising and skin lesion.; ; Neuro: Negative for headache, lightheadedness and neck stiffness. Negative for weakness, altered level of consciousness, altered mental status, extremity weakness, paresthesias, involuntary movement, seizure and syncope.       Physical Exam Updated Vital Signs BP 123/80 (BP Location: Left Arm)   Pulse 86   Temp 98.2 F (36.8 C) (Oral)   Resp 16   Ht 5\' 6"  (1.676 m)   Wt 175 lb (79.4 kg)   LMP 10/13/2016   SpO2 99%   BMI 28.25 kg/m   Physical Exam 1625: Physical examination:  Nursing notes reviewed; Vital signs and O2 SAT reviewed;  Constitutional: Well developed, Well nourished, Well hydrated, In no acute distress; Head:  Normocephalic, atraumatic; Eyes: EOMI, PERRL, No scleral icterus; ENMT: Mouth and pharynx normal, Mucous membranes moist; Neck: Supple, Full range of motion, No lymphadenopathy; Cardiovascular: Regular rate and rhythm, No gallop; Respiratory: Breath sounds clear & equal bilaterally, No wheezes.  Speaking full sentences with ease, Normal respiratory effort/excursion; Chest: Nontender, Movement normal; Abdomen: Soft, Nontender, Nondistended, Normal bowel sounds; Genitourinary: No CVA tenderness; Extremities: Pulses normal, No tenderness, No edema, No calf edema or asymmetry.; Neuro: AA&Ox3, Major CN grossly intact.  Speech clear. No gross focal motor or sensory deficits in extremities.; Skin: Color normal, Warm, Dry.   ED Treatments / Results  Labs (all labs ordered are listed, but only abnormal results are displayed)   EKG  EKG Interpretation None       Radiology   Procedures Procedures (including critical care time)  Medications Ordered in ED Medications  gi cocktail (Maalox,Lidocaine,Donnatal) (30 mLs Oral Given 10/25/16 1640)     Initial Impression / Assessment and Plan / ED Course  I have reviewed the triage vital signs and the nursing  notes.  Pertinent labs & imaging results that were available during my care of the patient were reviewed by me and considered in my medical decision making (see chart for details).  MDM Reviewed: previous chart, nursing note and vitals Reviewed previous: labs Interpretation: labs and x-ray   Results for orders placed or performed during the hospital encounter of 10/25/16  Wet prep, genital  Result Value Ref Range   Yeast Wet Prep HPF POC NONE SEEN NONE SEEN   Trich, Wet Prep NONE SEEN NONE SEEN   Clue Cells Wet Prep HPF POC PRESENT (A) NONE SEEN   WBC, Wet Prep HPF POC FEW (A) NONE SEEN   Sperm NONE SEEN   Urinalysis, Routine w reflex microscopic  Result Value Ref Range   Color, Urine YELLOW YELLOW   APPearance HAZY (A) CLEAR   Specific Gravity, Urine 1.015 1.005 - 1.030   pH 6.0 5.0 - 8.0   Glucose,  UA NEGATIVE NEGATIVE mg/dL   Hgb urine dipstick NEGATIVE NEGATIVE   Bilirubin Urine NEGATIVE NEGATIVE   Ketones, ur NEGATIVE NEGATIVE mg/dL   Protein, ur NEGATIVE NEGATIVE mg/dL   Nitrite NEGATIVE NEGATIVE   Leukocytes, UA NEGATIVE NEGATIVE  Pregnancy, urine  Result Value Ref Range   Preg Test, Ur NEGATIVE NEGATIVE  Comprehensive metabolic panel  Result Value Ref Range   Sodium 138 135 - 145 mmol/L   Potassium 3.7 3.5 - 5.1 mmol/L   Chloride 106 101 - 111 mmol/L   CO2 27 22 - 32 mmol/L   Glucose, Bld 106 (H) 65 - 99 mg/dL   BUN 15 6 - 20 mg/dL   Creatinine, Ser 1.61 0.44 - 1.00 mg/dL   Calcium 8.9 8.9 - 09.6 mg/dL   Total Protein 7.2 6.5 - 8.1 g/dL   Albumin 3.8 3.5 - 5.0 g/dL   AST 20 15 - 41 U/L   ALT 18 14 - 54 U/L   Alkaline Phosphatase 62 38 - 126 U/L   Total Bilirubin 0.6 0.3 - 1.2 mg/dL   GFR calc non Af Amer >60 >60 mL/min   GFR calc Af Amer >60 >60 mL/min   Anion gap 5 5 - 15  Lipase, blood  Result Value Ref Range   Lipase 19 11 - 51 U/L  CBC with Differential  Result Value Ref Range   WBC 9.6 4.0 - 10.5 K/uL   RBC 4.02 3.87 - 5.11 MIL/uL   Hemoglobin  11.6 (L) 12.0 - 15.0 g/dL   HCT 04.5 (L) 40.9 - 81.1 %   MCV 87.1 78.0 - 100.0 fL   MCH 28.9 26.0 - 34.0 pg   MCHC 33.1 30.0 - 36.0 g/dL   RDW 91.4 78.2 - 95.6 %   Platelets 250 150 - 400 K/uL   Neutrophils Relative % 66 %   Neutro Abs 6.4 1.7 - 7.7 K/uL   Lymphocytes Relative 27 %   Lymphs Abs 2.5 0.7 - 4.0 K/uL   Monocytes Relative 6 %   Monocytes Absolute 0.5 0.1 - 1.0 K/uL   Eosinophils Relative 1 %   Eosinophils Absolute 0.1 0.0 - 0.7 K/uL   Basophils Relative 0 %   Basophils Absolute 0.0 0.0 - 0.1 K/uL   Dg Abd Acute W/chest Result Date: 10/25/2016 CLINICAL DATA:  Upper abdominal pain for 2 days. EXAM: DG ABDOMEN ACUTE W/ 1V CHEST COMPARISON:  05/15/2015 FINDINGS: There is no evidence of dilated bowel loops or free intraperitoneal air. No radiopaque calculi or other significant radiographic abnormality is seen. Heart size and mediastinal contours are within normal limits. Both lungs are clear. Generous colonic stool volume very IMPRESSION: Negative abdominal radiographs. Generous colonic stool volume. No acute cardiopulmonary disease. Electronically Signed   By: Ellery Plunk M.D.   On: 10/25/2016 18:10    1815:  Udip without infection. Abd remains benign. Pt has tol PO well without N/V. Pt had multiple questions regarding herpes, valtrex, chronic vulvar pain, neurontin; multiple questions answered and pt strongly encouraged to f/u with GYN MD for further discussion and care for her chronic pain. Pt now states she "might have an outbreak" (denied previously to myself and ED RN). Will perform pelvic exam.   1845:  Pelvic exam performed with permission of pt and female ED tech assist during exam:  External genitalia w/o lesions. Vaginal vault with thick white discharge.  Cervix w/o lesions, not friable, GC/chlam and wet prep obtained and sent to lab.  Bimanual exam w/o CMT,  uterine or adnexal tenderness.   1925:  Will tx for BV. Dx and testing d/w pt.  Questions answered.  Verb  understanding, agreeable to d/c home with outpt f/u.    Final Clinical Impressions(s) / ED Diagnoses   Final diagnoses:  None    New Prescriptions New Prescriptions   No medications on file     Samuel JesterKathleen Kaimana Neuzil, DO 10/28/16 1328

## 2016-10-27 LAB — GC/CHLAMYDIA PROBE AMP (~~LOC~~) NOT AT ARMC
Chlamydia: NEGATIVE
Neisseria Gonorrhea: NEGATIVE

## 2016-11-05 ENCOUNTER — Ambulatory Visit: Payer: BLUE CROSS/BLUE SHIELD | Admitting: Obstetrics & Gynecology

## 2017-01-19 ENCOUNTER — Emergency Department (HOSPITAL_COMMUNITY): Payer: Self-pay

## 2017-01-19 ENCOUNTER — Emergency Department (HOSPITAL_COMMUNITY)
Admission: EM | Admit: 2017-01-19 | Discharge: 2017-01-19 | Disposition: A | Discharge status: Home / Self Care | Payer: Self-pay | Attending: Emergency Medicine | Admitting: Emergency Medicine

## 2017-01-19 ENCOUNTER — Encounter (HOSPITAL_COMMUNITY): Payer: Self-pay | Admitting: *Deleted

## 2017-01-19 DIAGNOSIS — Z79899 Other long term (current) drug therapy: Secondary | ICD-10-CM | POA: Insufficient documentation

## 2017-01-19 DIAGNOSIS — R42 Dizziness and giddiness: Secondary | ICD-10-CM

## 2017-01-19 DIAGNOSIS — E860 Dehydration: Secondary | ICD-10-CM | POA: Insufficient documentation

## 2017-01-19 LAB — URINALYSIS, ROUTINE W REFLEX MICROSCOPIC
Bilirubin Urine: NEGATIVE
Glucose, UA: NEGATIVE mg/dL
Hgb urine dipstick: NEGATIVE
Ketones, ur: 20 mg/dL — AB
Leukocytes, UA: NEGATIVE
NITRITE: NEGATIVE
PH: 5 (ref 5.0–8.0)
Protein, ur: NEGATIVE mg/dL
SPECIFIC GRAVITY, URINE: 1.027 (ref 1.005–1.030)

## 2017-01-19 LAB — COMPREHENSIVE METABOLIC PANEL
ALBUMIN: 4 g/dL (ref 3.5–5.0)
ALT: 19 U/L (ref 14–54)
AST: 22 U/L (ref 15–41)
Alkaline Phosphatase: 67 U/L (ref 38–126)
Anion gap: 7 (ref 5–15)
BUN: 12 mg/dL (ref 6–20)
CHLORIDE: 104 mmol/L (ref 101–111)
CO2: 27 mmol/L (ref 22–32)
CREATININE: 0.64 mg/dL (ref 0.44–1.00)
Calcium: 9 mg/dL (ref 8.9–10.3)
GFR calc Af Amer: >60 mL/min (ref >60)
GFR calc non Af Amer: >60 mL/min (ref >60)
GLUCOSE: 82 mg/dL (ref 65–99)
Potassium: 3.6 mmol/L (ref 3.5–5.1)
Sodium: 138 mmol/L (ref 135–145)
Total Bilirubin: 0.6 mg/dL (ref 0.3–1.2)
Total Protein: 7.6 g/dL (ref 6.5–8.1)

## 2017-01-19 LAB — CBC WITH DIFFERENTIAL/PLATELET
BASOS ABS: 0 10*3/uL (ref 0.0–0.1)
BASOS PCT: 0 %
EOS PCT: 0 %
Eosinophils Absolute: 0 10*3/uL (ref 0.0–0.7)
HCT: 36.6 % (ref 36.0–46.0)
Hemoglobin: 12.1 g/dL (ref 12.0–15.0)
LYMPHS PCT: 22 %
Lymphs Abs: 2.6 10*3/uL (ref 0.7–4.0)
MCH: 28.7 pg (ref 26.0–34.0)
MCHC: 33.1 g/dL (ref 30.0–36.0)
MCV: 86.7 fL (ref 78.0–100.0)
Monocytes Absolute: 0.9 10*3/uL (ref 0.1–1.0)
Monocytes Relative: 8 %
NEUTROS ABS: 8.3 10*3/uL — AB (ref 1.7–7.7)
Neutrophils Relative %: 70 %
PLATELETS: 284 10*3/uL (ref 150–400)
RBC: 4.22 MIL/uL (ref 3.87–5.11)
RDW: 13.1 % (ref 11.5–15.5)
WBC: 11.8 10*3/uL — AB (ref 4.0–10.5)

## 2017-01-19 LAB — I-STAT BETA HCG BLOOD, ED (MC, WL, AP ONLY)

## 2017-01-19 NOTE — ED Provider Notes (Signed)
[facility] DEPT Provider Note   CSN: [id number] Arrival date & time: 01/19/17  1620     History   Chief Complaint Chief Complaint  Patient presents with  . Dizziness    HPI Rachel Stevens is a 36 y.o. female.  Patient states that she's been having occasional dizziness. Not passing out and she did not feel like the room was spinning   The history is provided by the patient. No language interpreter was used.  Dizziness  Quality:  Lightheadedness Severity:  Moderate Onset quality:  Sudden Timing:  Intermittent Progression:  Resolved Chronicity:  New Context: not when bending over   Relieved by:  Nothing Associated symptoms: no chest pain, no diarrhea and no headaches     Past Medical History:  Diagnosis Date  . Chronic pelvic pain in female   . Herpes   . 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 Irregular periods 01/31/2015  . Missed period 11/01/2014  . Patient desires pregnancy 11/01/2014  . Pelvic floor tension   . Vaginal discharge 11/01/2014  . Vulvar pain   . Weight gain 11/01/2014    Patient Active Problem List   Diagnosis Date Noted  . Irregular periods 01/31/2015  . 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 Missed period 11/01/2014  . Weight gain 11/01/2014  . Vaginal discharge 11/01/2014  . Patient desires pregnancy 11/01/2014    Past Surgical History:  Procedure Laterality Date  . REDUCTION MAMMAPLASTY Right     OB History    Gravida Para Term Preterm AB Living   0             SAB TAB Ectopic Multiple Live Births                   Home Medications    Prior to Admission medications   Medication Sig Start Date End Date Taking? Authorizing Provider  Ascorbic Acid (VITAMIN C) 1000 MG tablet Take 1,000 mg by mouth daily.   Yes Historical Provider, MD  b complex vitamins tablet Take 1 tablet by mouth daily.   Yes Historical Provider, MD  Multiple Vitamin (MULTIVITAMIN WITH MINERALS) TABS tablet Take 1 tablet by  mouth daily.   Yes Historical Provider, MD    Family History Family History  Problem Relation Age of Onset  . Hypertension Mother   . Heart disease Mother   . Cancer Father     throat  . Congestive Heart Failure Brother   . Heart disease Maternal Grandmother   . Cancer Paternal Grandmother     Social History Social History  Substance Use Topics  . Smoking status: Never Smoker  . Smokeless tobacco: Never Used  . Alcohol use No     Allergies   Bee venom; Latex; and Penicillins   Review of Systems Review of Systems  Constitutional: Negative for appetite change and fatigue.  HENT: Negative for congestion, ear discharge and sinus pressure.   Eyes: Negative for discharge.  Respiratory: Negative for cough.   Cardiovascular: Negative for chest pain.  Gastrointestinal: Negative for abdominal pain and diarrhea.  Genitourinary: Negative for frequency and hematuria.  Musculoskeletal: Negative for back pain.  Skin: Negative for rash.  Neurological: Positive for dizziness. Negative for seizures and headaches.  Psychiatric/Behavioral: Negative for hallucinations.     Physical Exam Updated Vital Signs BP 118/78   Pulse 79   Temp 99.1 F (37.3 C) (Oral)   Resp 18   Ht  (1.676 m)  Wt 175 lb (79.4 kg)   LMP 01/06/2017   SpO2 100%   BMI 28.25 kg/m   Physical Exam  Constitutional: She is oriented to person, place, and time. She appears well-developed.  HENT:  Head: Normocephalic.  Eyes: Conjunctivae and EOM are normal. No scleral icterus.  Neck: Neck supple. No thyromegaly present.  Cardiovascular: Normal rate and regular rhythm.  Exam reveals no gallop and no friction rub.   No murmur heard. Pulmonary/Chest: No stridor. She has no wheezes. She has no rales. She exhibits no tenderness.  Abdominal: She exhibits no distension. There is no tenderness. There is no rebound.  Musculoskeletal: Normal range of motion. She exhibits no edema.  Lymphadenopathy:    She has  no cervical adenopathy.  Neurological: She is oriented to person, place, and time. She exhibits normal muscle tone. Coordination normal.  Skin: No rash noted. No erythema.  Psychiatric: She has a normal mood and affect. Her behavior is normal.     ED Treatments / Results  Labs (all labs ordered are listed, but only abnormal results are displayed) Labs Reviewed  CBC WITH DIFFERENTIAL/PLATELET - Abnormal; Notable for the following:       Result Value   WBC 11.8 (*)    Neutro Abs 8.3 (*)    All other components within normal limits  URINALYSIS, ROUTINE W REFLEX MICROSCOPIC - Abnormal; Notable for the following:    APPearance HAZY (*)    Ketones, ur 20 (*)    All other components within normal limits  COMPREHENSIVE METABOLIC PANEL  I-STAT BETA HCG BLOOD, ED ([facility], [facility], [facility] ONLY)    EKG  EKG Interpretation None       Radiology Ct Head Wo Contrast  Result Date: 01/19/2017 CLINICAL DATA:  Acute onset of dizziness.  Initial encounter. EXAM: CT HEAD WITHOUT CONTRAST TECHNIQUE: Contiguous axial images were obtained from the base of the skull through the vertex without intravenous contrast. COMPARISON:  CT of the head performed 01/17/2010 FINDINGS: Brain: No evidence of acute infarction, hemorrhage, hydrocephalus, extra-axial collection or mass lesion/mass effect. The posterior fossa, including the cerebellum, brainstem and fourth ventricle, is within normal limits. The third and lateral ventricles, and basal ganglia are unremarkable in appearance. The cerebral hemispheres are symmetric in appearance, with normal gray-white differentiation. No mass effect or midline shift is seen. Vascular: No hyperdense vessel or unexpected calcification. Skull: There is no evidence of fracture; visualized osseous structures are unremarkable in appearance. Sinuses/Orbits: The visualized portions of the orbits are within normal limits. The paranasal sinuses and mastoid air cells are well-aerated. Other: No  significant soft tissue abnormalities are seen. IMPRESSION: Unremarkable noncontrast CT of the head. Electronically Signed   By: Roanna Raider M.D.   On: 01/19/2017 18:28    Procedures Procedures (including critical care time)  Medications Ordered in ED Medications - No data to display   Initial Impression / Assessment and Plan / ED Course  I have reviewed the triage vital signs and the nursing notes.  Pertinent labs & imaging results that were available during my care of the patient were reviewed by me and considered in my medical decision making (see chart for details).     Patient is mildly dehydrated. She is instructed to drink plenty of fluids and follow-up if she has a problem  Final Clinical Impressions(s) / ED Diagnoses   Final diagnoses:  Dizziness  Dehydration    New Prescriptions New Prescriptions   No medications on file     Bethann Berkshire,  MD 01/19/17 1911

## 2017-01-19 NOTE — Discharge Instructions (Signed)
`  drink plenty of fluids and follow up if problems

## 2017-01-19 NOTE — ED Triage Notes (Addendum)
Pt states she has been having dizziness 2-3x per week since November. Denies any n/v/d. Pt states she may be sitting still or driving when this happens. Denies any pain.

## 2017-01-22 ENCOUNTER — Other Ambulatory Visit: Payer: Self-pay | Admitting: Adult Health

## 2017-03-08 LAB — HM PAP SMEAR: HM Pap smear: NEGATIVE

## 2017-03-10 ENCOUNTER — Encounter: Payer: Self-pay | Admitting: *Deleted

## 2017-03-10 ENCOUNTER — Emergency Department
Admission: EM | Admit: 2017-03-10 | Discharge: 2017-03-10 | Disposition: A | Discharge status: Home / Self Care | Payer: Self-pay | Attending: Emergency Medicine | Admitting: Emergency Medicine

## 2017-03-10 ENCOUNTER — Emergency Department: Payer: Self-pay

## 2017-03-10 DIAGNOSIS — Z79899 Other long term (current) drug therapy: Secondary | ICD-10-CM | POA: Insufficient documentation

## 2017-03-10 DIAGNOSIS — R102 Pelvic and perineal pain: Secondary | ICD-10-CM | POA: Insufficient documentation

## 2017-03-10 LAB — COMPREHENSIVE METABOLIC PANEL
ALT: 22 U/L (ref 14–54)
ANION GAP: 6 (ref 5–15)
AST: 27 U/L (ref 15–41)
Albumin: 3.9 g/dL (ref 3.5–5.0)
Alkaline Phosphatase: 62 U/L (ref 38–126)
BUN: 9 mg/dL (ref 6–20)
CHLORIDE: 105 mmol/L (ref 101–111)
CO2: 26 mmol/L (ref 22–32)
Calcium: 9 mg/dL (ref 8.9–10.3)
Creatinine, Ser: 0.65 mg/dL (ref 0.44–1.00)
Glucose, Bld: 96 mg/dL (ref 65–99)
POTASSIUM: 3.5 mmol/L (ref 3.5–5.1)
SODIUM: 137 mmol/L (ref 135–145)
Total Bilirubin: 0.4 mg/dL (ref 0.3–1.2)
Total Protein: 7.8 g/dL (ref 6.5–8.1)

## 2017-03-10 LAB — URINALYSIS, COMPLETE (UACMP) WITH MICROSCOPIC
BILIRUBIN URINE: NEGATIVE
Bacteria, UA: NONE SEEN
GLUCOSE, UA: NEGATIVE mg/dL
Hgb urine dipstick: NEGATIVE
Ketones, ur: NEGATIVE mg/dL
LEUKOCYTES UA: NEGATIVE
Nitrite: NEGATIVE
PH: 6 (ref 5.0–8.0)
PROTEIN: NEGATIVE mg/dL
Specific Gravity, Urine: 1.02 (ref 1.005–1.030)

## 2017-03-10 LAB — CBC
HEMATOCRIT: 36.5 % (ref 35.0–47.0)
HEMOGLOBIN: 12.3 g/dL (ref 12.0–16.0)
MCH: 28.8 pg (ref 26.0–34.0)
MCHC: 33.6 g/dL (ref 32.0–36.0)
MCV: 85.6 fL (ref 80.0–100.0)
Platelets: 280 10*3/uL (ref 150–440)
RBC: 4.27 MIL/uL (ref 3.80–5.20)
RDW: 13.2 % (ref 11.5–14.5)
WBC: 10.1 10*3/uL (ref 3.6–11.0)

## 2017-03-10 LAB — POCT PREGNANCY, URINE: Preg Test, Ur: NEGATIVE

## 2017-03-10 LAB — LIPASE, BLOOD: LIPASE: 21 U/L (ref 11–51)

## 2017-03-10 MED ORDER — IBUPROFEN 800 MG PO TABS
800.0000 mg | ORAL_TABLET | ORAL | Status: AC
  Administered 2017-03-10: 800 mg via ORAL
  Filled 2017-03-10: qty 1

## 2017-03-10 NOTE — ED Triage Notes (Signed)
Pt sent over from health dept, states she had a pelvic exam and was told they found clue cells, BV, and some bacteria in her urine, states they gave her abx but she was not feeling any better so was sent to ED, denies any discharge, awake and alert

## 2017-03-10 NOTE — ED Provider Notes (Signed)
[facility] Emergency Department Provider Note   ____________________________________________   First MD Initiated Contact with Patient 03/10/17 1154     (approximate)  I have reviewed the triage vital signs and the nursing notes.   HISTORY  Chief Complaint Pelvic Pain    HPI NORAH DEVIN is a 36 y.o. female reports ongoing pelvic discomfort today. She reports she's had struggled with discomfort similar to this for at least the last several years. She's been diagnosed and treated for bacterial vaginosis "so many times" and believes her symptoms seemed to have come back. She was seen for her [facility] Pap [facility], and reports that they told her that he didn't believe she had an STD but they wanted to treat her for it and also give her medicine for BV. She reports that he saw "bacteria" on her urine test and she was referred to the emergency department  No fevers or chills. No nausea or vomiting. Lower abdominal discomfort which she reports she's had off and on for about 7 years now.   Past Medical History:  Diagnosis Date  . Chronic pelvic pain in female   . Herpes   . 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 Irregular periods 01/31/2015  . Missed period 11/01/2014  . Patient desires pregnancy 11/01/2014  . Pelvic floor tension   . Vaginal discharge 11/01/2014  . Vulvar pain   . Weight gain 11/01/2014    Patient Active Problem List   Diagnosis Date Noted  . Irregular periods 01/31/2015  . 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 Missed period 11/01/2014  . Weight gain 11/01/2014  . Vaginal discharge 11/01/2014  . Patient desires pregnancy 11/01/2014    Past Surgical History:  Procedure Laterality Date  . REDUCTION MAMMAPLASTY Right     Prior to Admission medications   Medication Sig Start Date End Date Taking? Authorizing Provider  Ascorbic Acid (VITAMIN C) 1000 MG tablet Take 1,000 mg by mouth  daily.   Yes [provider]  b complex vitamins tablet Take 1 tablet by mouth daily.   Yes [provider]  Multiple Vitamin (MULTIVITAMIN WITH MINERALS) TABS tablet Take 1 tablet by mouth daily.   Yes [provider]  valACYclovir (VALTREX) 1000 MG tablet TAKE 1 TABLET BY MOUTH DAILY. 01/25/17  Yes Adline Potter, NP    Allergies Bee venom; Latex; and Penicillins  Family History  Problem Relation Age of Onset  . Hypertension Mother   . Heart disease Mother   . Cancer Father        throat  . Congestive Heart Failure Brother   . Heart disease Maternal Grandmother   . Cancer Paternal Grandmother     Social History Social History  Substance Use Topics  . Smoking status: Never Smoker  . Smokeless tobacco: Never Used  . Alcohol use No    Review of Systems Constitutional: No fever/chills Eyes: No visual changes. ENT: No sore throat. Cardiovascular: Denies chest pain. Respiratory: Denies shortness of breath. Gastrointestinal: No nausea, no vomiting.  No diarrhea.  No constipation. Genitourinary: Negative for dysuria. Musculoskeletal: Negative for back pain. Skin: Negative for rash. Neurological: Negative for headaches, focal weakness or numbness.    ____________________________________________   PHYSICAL EXAM:  VITAL SIGNS: ED Triage Vitals [03/10/17 1036]  Enc Vitals Group     BP 115/80     Pulse Rate 75     Resp 18     Temp  99.3 F (37.4 C)     Temp Source Oral     SpO2 100 %     Weight 178 lb (80.7 kg)     Height 5\' 6"  (1.676 m)     Head Circumference      Peak Flow      Pain Score 9     Pain Loc      Pain Edu?      Excl. in GC?     Constitutional: Alert and oriented. Well appearing and in no acute distress. Eyes: Conjunctivae are normal. Head: Atraumatic. Nose: No congestion/rhinnorhea. Mouth/Throat: Mucous membranes are moist. Neck: No stridor.   Cardiovascular: Normal rate, regular rhythm. Grossly normal heart  sounds.  Good peripheral circulation. Respiratory: Normal respiratory effort.  No retractions. Lungs CTAB. Gastrointestinal: Soft and minimal to no discomfort in the upper abdomen, negative Murphy. No focal pain at McBurney's point. Patient does report mild tenderness without rebound or guarding to palpation across the lower abdomen and suprapubic region. No CVA tenderness. There is no rebound, guarding or focal discomfort at McBurney's point. Discussed with the patient performing another gynecologic/pelvic exam, after discussing the reasons including further testing including rechecking wet prep and also checking for signs of "STD" the patient reports that she would not like to have another pelvic exam at this time. She has had one on Monday, and also has a number appointment this Friday afternoon with [facility] gynecology who prefer not to have a exam today. I think this is reasonable given her concerns and recent examination was also performed by the health department Musculoskeletal: No lower extremity tenderness nor edema. Neurologic:  Normal speech and language. No gross focal neurologic deficits are appreciated.  Skin:  Skin is warm, dry and intact. No rash noted. Psychiatric: Mood and affect are normal. Speech and behavior are normal.  ____________________________________________   LABS (all labs ordered are listed, but only abnormal results are displayed)  Labs Reviewed  URINALYSIS, COMPLETE (UACMP) WITH MICROSCOPIC - Abnormal; Notable for the following:       Result Value   Color, Urine YELLOW (*)    APPearance CLOUDY (*)    Squamous Epithelial / LPF 6-30 (*)    All other components within normal limits  LIPASE, BLOOD  COMPREHENSIVE METABOLIC PANEL  CBC  POC URINE PREG, ED  POCT PREGNANCY, URINE   ____________________________________________  EKG   ____________________________________________  RADIOLOGY  US Transvaginal Non-ob  Result Date: 03/10/2017 CLINICAL DATA:  Lower  pelvic pain and discomfort x2 months EXAM: TRANSABDOMINAL AND TRANSVAGINAL ULTRASOUND OF PELVIS DOPPLER ULTRASOUND OF OVARIES TECHNIQUE: Both transabdominal and transvaginal ultrasound examinations of the pelvis were performed. Transabdominal technique was performed for global imaging of the pelvis including uterus, ovaries, adnexal regions, and pelvic cul-de-sac. It was necessary to proceed with endovaginal exam following the transabdominal exam to visualize the uterus, ovaries and endometrium. Color and duplex Doppler ultrasound was utilized to evaluate blood flow to the ovaries. COMPARISON:  None. FINDINGS: Uterus Measurements: 6.9 x 3.5 x 4.1 cm in retroverted. No fibroids or other mass visualized. Endometrium Thickness: 8 mm.  No focal abnormality visualized. Right ovary Measurements: 3.1 x 2.3 x 2.5 cm. Normal appearance/no adnexal mass. Left ovary Measurements: 3.1 x 1.7 x 2.8 cm. Normal appearance/no adnexal mass. Pulsed Doppler evaluation of both ovaries demonstrates normal low-resistance arterial and venous waveforms. Other findings No abnormal free fluid. IMPRESSION: Unremarkable pelvic ultrasound.  No evidence of ovarian torsion. Electronically Signed   By: Rene Kocher.D.  On: 03/10/2017 13:29   [place] Pelvis Complete  Result Date: 03/10/2017 CLINICAL DATA:  Lower pelvic pain and discomfort x2 months EXAM: TRANSABDOMINAL AND TRANSVAGINAL ULTRASOUND OF PELVIS DOPPLER ULTRASOUND OF OVARIES TECHNIQUE: Both transabdominal and transvaginal ultrasound examinations of the pelvis were performed. Transabdominal technique was performed for global imaging of the pelvis including uterus, ovaries, adnexal regions, and pelvic cul-de-sac. It was necessary to proceed with endovaginal exam following the transabdominal exam to visualize the uterus, ovaries and endometrium. Color and duplex Doppler ultrasound was utilized to evaluate blood flow to the ovaries. COMPARISON:  None. FINDINGS: Uterus Measurements: 6.9 x 3.5  x 4.1 cm in retroverted. No fibroids or other mass visualized. Endometrium Thickness: 8 mm.  No focal abnormality visualized. Right ovary Measurements: 3.1 x 2.3 x 2.5 cm. Normal appearance/no adnexal mass. Left ovary Measurements: 3.1 x 1.7 x 2.8 cm. Normal appearance/no adnexal mass. Pulsed Doppler evaluation of both ovaries demonstrates normal low-resistance arterial and venous waveforms. Other findings No abnormal free fluid. IMPRESSION: Unremarkable pelvic ultrasound.  No evidence of ovarian torsion. Electronically Signed   By: Tollie Ethavid  Kwon M.D.   On: 03/10/2017 13:29   [place] Art/ven Flow Abd Pelv Doppler  Result Date: 03/10/2017 CLINICAL DATA:  Lower pelvic pain and discomfort x2 months EXAM: TRANSABDOMINAL AND TRANSVAGINAL ULTRASOUND OF PELVIS DOPPLER ULTRASOUND OF OVARIES TECHNIQUE: Both transabdominal and transvaginal ultrasound examinations of the pelvis were performed. Transabdominal technique was performed for global imaging of the pelvis including uterus, ovaries, adnexal regions, and pelvic cul-de-sac. It was necessary to proceed with endovaginal exam following the transabdominal exam to visualize the uterus, ovaries and endometrium. Color and duplex Doppler ultrasound was utilized to evaluate blood flow to the ovaries. COMPARISON:  None. FINDINGS: Uterus Measurements: 6.9 x 3.5 x 4.1 cm in retroverted. No fibroids or other mass visualized. Endometrium Thickness: 8 mm.  No focal abnormality visualized. Right ovary Measurements: 3.1 x 2.3 x 2.5 cm. Normal appearance/no adnexal mass. Left ovary Measurements: 3.1 x 1.7 x 2.8 cm. Normal appearance/no adnexal mass. Pulsed Doppler evaluation of both ovaries demonstrates normal low-resistance arterial and venous waveforms. Other findings No abnormal free fluid. IMPRESSION: Unremarkable pelvic ultrasound.  No evidence of ovarian torsion. Electronically Signed   By: Tollie Ethavid  Kwon M.D.   On: 03/10/2017 13:29       ____________________________________________   PROCEDURES  Procedure(s) performed: None  Procedures  Critical Care performed: No  ____________________________________________   INITIAL IMPRESSION / ASSESSMENT AND PLAN / ED COURSE  Pertinent labs & imaging results that were available during my care of the patient were reviewed by me and considered in my medical decision making (see chart for details).  Lower pelvic/abdominal discomfort. Appears to be very chronic in nature with an acute acute change in course not evident at this time. No evidence of an acute abdomen. She's been treated with ceftriaxone, azithromycin, and is now on clindamycin gel and Monday. She denies any heavy vaginal discharge, she does report an abnormal odor and discomfort that she describes as bacterial vaginosis versus occurred was continuously for her for the last several years. She has previous history of chronic pelvic pain and discomfort, I find no evidence of an acute change to warrant CT imaging at this time.  Normal white count. No significant fever. Awake alert without systemic symptoms.   At this juncture, I believe it is most appropriate for the patient to see gynecology specialist as she is scheduled on Friday with return precautions for the interim.  I have reviewed her records  from the health Department which she brought with her from her visit on Monday.   ----------------------------------------- 1:47 PM on 03/10/2017 -----------------------------------------  Patient resting comfortable. Reviewed ultrasound results with her, which do not show conclusive cause for her chronic lower abdominal discomfort. Discussed further testing, including CT abdomen and pelvis to further evaluate for cause of her lower abdominal pain today. The shared medical decision making, discussing her plan of care and current treatment, she would prefer to follow-up with [facility] gynecology Friday as planned and  continue her clindamycin cream at this time for suspected BV. I believe this is reasonable, she has no evidence of appendicitis, torsion, peritonitis, or obvious intra-abdominal infection at this time. Discussed careful return precautions for which she is agreeable  ____________________________________________   FINAL CLINICAL IMPRESSION(S) / ED DIAGNOSES  Final diagnoses:  Pelvic pain in female      NEW MEDICATIONS STARTED DURING THIS VISIT:  New Prescriptions   No medications on file     Note:  This document was prepared using Dragon voice recognition software and may include unintentional dictation errors.     Sharyn Creamer, MD 03/10/17 1349

## 2017-03-10 NOTE — Discharge Instructions (Signed)
? ?  Please return to the emergency room right away if you are to develop a fever, severe nausea, your pain becomes severe or worsens, you are unable to keep food down, begin vomiting any dark or bloody fluid, you develop any dark or bloody stools, feel dehydrated, or other new concerns or symptoms arise. ? ?

## 2017-04-20 ENCOUNTER — Emergency Department (HOSPITAL_COMMUNITY)
Admission: EM | Admit: 2017-04-20 | Discharge: 2017-04-20 | Disposition: A | Discharge status: Home / Self Care | Payer: Self-pay | Attending: Emergency Medicine | Admitting: Emergency Medicine

## 2017-04-20 DIAGNOSIS — O219 Vomiting of pregnancy, unspecified: Secondary | ICD-10-CM | POA: Insufficient documentation

## 2017-04-20 DIAGNOSIS — Z5321 Procedure and treatment not carried out due to patient leaving prior to being seen by health care provider: Secondary | ICD-10-CM | POA: Insufficient documentation

## 2017-04-20 DIAGNOSIS — Z3A01 Less than 8 weeks gestation of pregnancy: Secondary | ICD-10-CM | POA: Insufficient documentation

## 2017-04-20 NOTE — ED Triage Notes (Signed)
3 week 2 days pregnant, complaining of dizziness, nausea, vomiting.

## 2017-04-20 NOTE — ED Triage Notes (Signed)
Pt states she has been spotting since Saturday, denies pain

## 2017-04-22 ENCOUNTER — Other Ambulatory Visit: Payer: Self-pay | Admitting: Adult Health

## 2017-05-09 ENCOUNTER — Emergency Department (HOSPITAL_COMMUNITY)
Admission: EM | Admit: 2017-05-09 | Discharge: 2017-05-09 | Disposition: A | Discharge status: Home / Self Care | Payer: Medicaid Other | Attending: Emergency Medicine | Admitting: Emergency Medicine

## 2017-05-09 ENCOUNTER — Encounter (HOSPITAL_COMMUNITY): Payer: Self-pay | Admitting: Emergency Medicine

## 2017-05-09 DIAGNOSIS — Z91040 Latex allergy status: Secondary | ICD-10-CM | POA: Diagnosis not present

## 2017-05-09 DIAGNOSIS — R1030 Lower abdominal pain, unspecified: Secondary | ICD-10-CM

## 2017-05-09 LAB — URINALYSIS, ROUTINE W REFLEX MICROSCOPIC
Bilirubin Urine: NEGATIVE
Glucose, UA: NEGATIVE mg/dL
Hgb urine dipstick: NEGATIVE
KETONES UR: NEGATIVE mg/dL
LEUKOCYTES UA: NEGATIVE
NITRITE: NEGATIVE
PROTEIN: NEGATIVE mg/dL
Specific Gravity, Urine: 1.023 (ref 1.005–1.030)
pH: 6 (ref 5.0–8.0)

## 2017-05-09 LAB — HCG, QUANTITATIVE, PREGNANCY

## 2017-05-09 MED ORDER — IBUPROFEN 800 MG PO TABS
800.0000 mg | ORAL_TABLET | Freq: Once | ORAL | Status: AC
  Administered 2017-05-09: 800 mg via ORAL
  Filled 2017-05-09: qty 1

## 2017-05-09 MED ORDER — IBUPROFEN 800 MG PO TABS: 800.0000 mg | ORAL_TABLET | Freq: Three times a day (TID) | ORAL | 0 refills | Status: DC

## 2017-05-09 NOTE — ED Triage Notes (Signed)
Pt reports positive pregnancy test on July [facility]th at [facility] department.  Pt reports had vaginal bleeding with "clots" several days after positive test but reports that went away. Pt reports took a test the other day and it was "negative". Pt reports abd cramping for last several days. Pt denies any vaginal bleeding or discharge at this time.

## 2017-05-09 NOTE — ED Provider Notes (Signed)
[facility]-EMERGENCY DEPT Provider Note   CSN: [id number] Arrival date & time: 05/09/17  1431     History   Chief Complaint Chief Complaint  Patient presents with  . Abdominal Pain    HPI Rachel Stevens is a 36 y.o. female.  HPI  The patient is a 36 year old female, she has a history of chronic pelvic pain and irregular menstrual cycles, took a pregnancy test approximately 4 weeks ago during which time she had a positive test, she went to the health Department to have this confirmed, shortly thereafter for the next 7-10 day she had increased leading and cramping in the pelvis which gradually has gone away over the last couple of weeks there has been no bleeding. She is concerned because she has not taken a pregnancy test at home which was negative and she fears she has had a miscarriage. She still has some cramping in the lower abdomen which is daily, it does not change location, it is not made worse with food, she has no associated dysuria diarrhea or changes in bowel habits and she is no longer having any vaginal bleeding. There is no fevers chills nausea or vomiting. She has been eating without difficulty.  Past Medical History:  Diagnosis Date  . Chronic pelvic pain in female   . Herpes   . 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 Irregular periods 01/31/2015  . Missed period 11/01/2014  . Patient desires pregnancy 11/01/2014  . Pelvic floor tension   . Vaginal discharge 11/01/2014  . Vulvar pain   . Weight gain 11/01/2014    Patient Active Problem List   Diagnosis Date Noted  . Irregular periods 01/31/2015  . 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 Missed period 11/01/2014  . Weight gain 11/01/2014  . Vaginal discharge 11/01/2014  . Patient desires pregnancy 11/01/2014    Past Surgical History:  Procedure Laterality Date  . REDUCTION MAMMAPLASTY Right     OB History    Gravida Para Term Preterm AB Living   0             SAB TAB Ectopic Multiple  Live Births                   Home Medications    Prior to Admission medications   Medication Sig Start Date End Date Taking? Authorizing Provider  acetaminophen (TYLENOL) 500 MG tablet Take 1,000 mg by mouth every 6 (six) hours as needed for mild pain or headache.   Yes [provider]  Ascorbic Acid (VITAMIN C) 1000 MG tablet Take 1,000 mg by mouth daily.   Yes [provider]  Multiple Vitamin (MULTIVITAMIN WITH MINERALS) TABS tablet Take 1 tablet by mouth daily.   Yes [provider]  valACYclovir (VALTREX) 1000 MG tablet TAKE 1 TABLET BY MOUTH DAILY. 04/22/17  Yes Cyril MourningGriffin, Jennifer A, NP  ibuprofen (ADVIL,MOTRIN) 800 MG tablet Take 1 tablet (800 mg total) by mouth 3 (three) times daily. 05/09/17   Eber HongMiller, Avin Gibbons, MD    Family History Family History  Problem Relation Age of Onset  . Hypertension Mother   . Heart disease Mother   . Cancer Father        throat  . Congestive Heart Failure Brother   . Heart disease Maternal Grandmother   . Cancer Paternal Grandmother     Social History Social History  Substance Use Topics  . Smoking status: Never Smoker  . Smokeless tobacco: Never Used  .  Alcohol use No     Allergies   Bee venom; Latex; and Penicillins   Review of Systems Review of Systems  All other systems reviewed and are negative.    Physical Exam Updated Vital Signs BP 137/78 (BP Location: Right Arm)   Pulse 89   Temp 98.9 F (37.2 C) (Oral)   Resp 18   Ht 5\' 6"  (1.676 m)   Wt 79.4 kg (175 lb)   LMP 03/23/2017   SpO2 100%   BMI 28.25 kg/m   Physical Exam  Constitutional: She appears well-developed and well-nourished. No distress.  HENT:  Head: Normocephalic and atraumatic.  Mouth/Throat: Oropharynx is clear and moist. No oropharyngeal exudate.  Eyes: Pupils are equal, round, and reactive to light. Conjunctivae and EOM are normal. Right eye exhibits no discharge. Left eye exhibits no discharge. No scleral icterus.  Neck:  Normal range of motion. Neck supple. No JVD present. No thyromegaly present.  Cardiovascular: Normal rate, regular rhythm, normal heart sounds and intact distal pulses.  Exam reveals no gallop and no friction rub.   No murmur heard. Pulmonary/Chest: Effort normal and breath sounds normal. No respiratory distress. She has no wheezes. She has no rales.  Abdominal: Soft. Bowel sounds are normal. She exhibits no distension and no mass. There is tenderness ( mild lower abd ttp in the RLQ, LLQ and SP area).  Musculoskeletal: Normal range of motion. She exhibits no edema or tenderness.  Lymphadenopathy:    She has no cervical adenopathy.  Neurological: She is alert. Coordination normal.  Skin: Skin is warm and dry. No rash noted. No erythema.  Psychiatric: She has a normal mood and affect. Her behavior is normal.  Nursing note and vitals reviewed.    ED Treatments / Results  Labs (all labs ordered are listed, but only abnormal results are displayed) Labs Reviewed  URINALYSIS, ROUTINE W REFLEX MICROSCOPIC - Abnormal; Notable for the following:       Result Value   APPearance CLOUDY (*)    All other components within normal limits  HCG, QUANTITATIVE, PREGNANCY      Radiology No results found.  Procedures Procedures (including critical care time)  Medications Ordered in ED Medications  ibuprofen (ADVIL,MOTRIN) tablet 800 mg (not administered)     Initial Impression / Assessment and Plan / ED Course  I have reviewed the triage vital signs and the nursing notes.  Pertinent labs & imaging results that were available during my care of the patient were reviewed by me and considered in my medical decision making (see chart for details).    Denies vaginal sx and no longer has any bleeding and denies d/c. R/o pregnancy with quant UA to r/o UTI Pt in agreement.  No uti hcg neg Motrin given Stable for d/c Non surgical abdomen  Final Clinical Impressions(s) / ED Diagnoses    Final diagnoses:  Lower abdominal pain    New Prescriptions New Prescriptions   IBUPROFEN (ADVIL,MOTRIN) 800 MG TABLET    Take 1 tablet (800 mg total) by mouth 3 (three) times daily.     Eber Hong, MD 05/09/17 [phone]

## 2017-05-09 NOTE — Discharge Instructions (Signed)
Ibuprofen 3 times daily for pain Stool softeners as needed No Urinary infection found You are not pregnant. ER for worsening pain

## 2017-06-08 DIAGNOSIS — N939 Abnormal uterine and vaginal bleeding, unspecified: Secondary | ICD-10-CM | POA: Diagnosis not present

## 2017-06-08 DIAGNOSIS — M545 Low back pain: Secondary | ICD-10-CM | POA: Diagnosis present

## 2017-06-08 DIAGNOSIS — Z91040 Latex allergy status: Secondary | ICD-10-CM | POA: Insufficient documentation

## 2017-06-08 DIAGNOSIS — Z79899 Other long term (current) drug therapy: Secondary | ICD-10-CM | POA: Insufficient documentation

## 2017-06-09 ENCOUNTER — Emergency Department (HOSPITAL_COMMUNITY)
Admission: EM | Admit: 2017-06-09 | Discharge: 2017-06-09 | Disposition: A | Discharge status: Home / Self Care | Payer: Medicaid Other | Attending: Emergency Medicine | Admitting: Emergency Medicine

## 2017-06-09 ENCOUNTER — Encounter (HOSPITAL_COMMUNITY): Payer: Self-pay | Admitting: *Deleted

## 2017-06-09 DIAGNOSIS — M545 Low back pain, unspecified: Secondary | ICD-10-CM

## 2017-06-09 DIAGNOSIS — N939 Abnormal uterine and vaginal bleeding, unspecified: Secondary | ICD-10-CM

## 2017-06-09 LAB — URINALYSIS, ROUTINE W REFLEX MICROSCOPIC
Bacteria, UA: NONE SEEN
Bilirubin Urine: NEGATIVE
GLUCOSE, UA: NEGATIVE mg/dL
KETONES UR: 5 mg/dL — AB
NITRITE: NEGATIVE
PROTEIN: NEGATIVE mg/dL
Specific Gravity, Urine: 1.023 (ref 1.005–1.030)
pH: 5 (ref 5.0–8.0)

## 2017-06-09 LAB — POC URINE PREG, ED: Preg Test, Ur: NEGATIVE

## 2017-06-09 NOTE — ED Triage Notes (Signed)
Pt c/o lower back pain and states she had some vaginal bleeding this past weekend with a small amount of clots; pt states the bleeding has stopped but she is still having lower back pain

## 2017-06-09 NOTE — Discharge Instructions (Signed)

## 2017-06-09 NOTE — ED Provider Notes (Signed)
[facility] DEPT Provider Note   CSN: [id number] Arrival date & time: 06/08/17  2343     History   Chief Complaint No chief complaint on file.   HPI Rachel Stevens is a 36 y.o. female.  The history is provided by the patient.  Back Pain   This is a new problem. The current episode started more than 2 days ago. The problem occurs daily. The problem has been gradually worsening. The pain is associated with no known injury. The pain is present in the lumbar spine. Associated symptoms include abdominal pain. Pertinent negatives include no fever, no dysuria and no weakness. She has tried analgesics for the symptoms. The treatment provided no relief. Risk factors include poor posture.  pt reports that several days ago she began having low back pain and also vaginal bleeding  the bleeding has stopped, but still has back pain She did have some low abd pain that improved She thinks she may have hurt her back at work due to heavy lifting but no trauma No new weakness No other acute complaints  Past Medical History:  Diagnosis Date  . Chronic pelvic pain in female   . Herpes   . 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 Irregular periods 01/31/2015  . Missed period 11/01/2014  . Patient desires pregnancy 11/01/2014  . Pelvic floor tension   . Vaginal discharge 11/01/2014  . Vulvar pain   . Weight gain 11/01/2014    Patient Active Problem List   Diagnosis Date Noted  . Irregular periods 01/31/2015  . 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 Missed period 11/01/2014  . Weight gain 11/01/2014  . Vaginal discharge 11/01/2014  . Patient desires pregnancy 11/01/2014    Past Surgical History:  Procedure Laterality Date  . REDUCTION MAMMAPLASTY Right     OB History    Gravida Para Term Preterm AB Living   0             SAB TAB Ectopic Multiple Live Births                   Home Medications    Prior to Admission medications   Medication Sig Start  Date End Date Taking? Authorizing Provider  acetaminophen (TYLENOL) 500 MG tablet Take 1,000 mg by mouth every 6 (six) hours as needed for mild pain or headache.    [provider]  Ascorbic Acid (VITAMIN C) 1000 MG tablet Take 1,000 mg by mouth daily.    [provider]  ibuprofen (ADVIL,MOTRIN) 800 MG tablet Take 1 tablet (800 mg total) by mouth 3 (three) times daily. 05/09/17   Eber HongMiller, Brian, MD  Multiple Vitamin (MULTIVITAMIN WITH MINERALS) TABS tablet Take 1 tablet by mouth daily.    [provider]  valACYclovir (VALTREX) 1000 MG tablet TAKE 1 TABLET BY MOUTH DAILY. 04/22/17   Adline PotterGriffin, Jennifer A, NP    Family History Family History  Problem Relation Age of Onset  . Hypertension Mother   . Heart disease Mother   . Cancer Father        throat  . Congestive Heart Failure Brother   . Heart disease Maternal Grandmother   . Cancer Paternal Grandmother     Social History Social History  Substance Use Topics  . Smoking status: Never Smoker  . Smokeless tobacco: Never Used  . Alcohol use No     Allergies   Bee venom; Latex; and Penicillins   Review of Systems Review  of Systems  Constitutional: Negative for fever.  Gastrointestinal: Positive for abdominal pain.  Genitourinary: Positive for vaginal bleeding. Negative for dysuria.  Musculoskeletal: Positive for back pain.  Neurological: Negative for weakness.  All other systems reviewed and are negative.    Physical Exam Updated Vital Signs BP 116/83   Pulse 76   Temp 98.3 F (36.8 C) (Oral)   Resp 18   LMP 05/15/2017   SpO2 100%   Physical Exam CONSTITUTIONAL: Well developed/well nourished HEAD: Normocephalic/atraumatic EYES: EOMI/PERRL ENMT: Mucous membranes moist NECK: supple no meningeal signs SPINE/BACK:mild lumbar spinal tenderness, No bruising/crepitance/stepoffs noted to spine CV: S1/S2 noted, no murmurs/rubs/gallops noted LUNGS: Lungs are clear to auscultation bilaterally, no  apparent distress ABDOMEN: soft, nontender, no rebound or guarding, bowel sounds noted throughout abdomen GU:no cva tenderness NEURO: Pt is awake/alert/appropriate, moves all extremitiesx4.  No facial droop.  Pt is ambulatory.  No focal weakness in the lower extremities.  Equal; power in both lower extremities EXTREMITIES: pulses normal/equal, full ROM SKIN: warm, color normal PSYCH: no abnormalities of mood noted, alert and oriented to situation   ED Treatments / Results  Labs (all labs ordered are listed, but only abnormal results are displayed) Labs Reviewed  URINALYSIS, ROUTINE W REFLEX MICROSCOPIC - Abnormal; Notable for the following:       Result Value   APPearance HAZY (*)    Hgb urine dipstick MODERATE (*)    Ketones, ur 5 (*)    Leukocytes, UA TRACE (*)    Squamous Epithelial / LPF 6-30 (*)    All other components within normal limits  POC URINE PREG, ED    EKG  EKG Interpretation None       Radiology No results found.  Procedures Procedures (including critical care time)  Medications Ordered in ED Medications - No data to display   Initial Impression / Assessment and Plan / ED Course  I have reviewed the triage vital signs and the nursing notes.  Pertinent labs  results that were available during my care of the patient were reviewed by me and considered in my medical decision making (see chart for details).     Pt stable We discussed return precautions Work restrictions discussed  Final Clinical Impressions(s) / ED Diagnoses   Final diagnoses:  Acute midline low back pain without sciatica  Abnormal vaginal bleeding    New Prescriptions Discharge Medication List as of 06/09/2017  1:20 AM       Zadie Rhine, MD 06/09/17 [phone]

## 2017-09-09 ENCOUNTER — Emergency Department (HOSPITAL_COMMUNITY)
Admission: EM | Admit: 2017-09-09 | Discharge: 2017-09-09 | Discharge status: Against Medical Advice | Payer: Medicaid Other

## 2017-09-09 NOTE — ED Notes (Signed)
Pt asked registration why she had not been seen yet. Registration called this RN in triage and I looked and didn't see her name in "triage waiting room". I looked pt up in "ED chart" and it said pt was in "[facility] Emergency Department". Upon further research, pt's name was found in "Results Pending". I was currently triaging another pt while registration called me. I told registration to tell the pt I would triage her next as soon as I got finished with the pt I was currently triaging. When RN went to call for pt back to triage, registration notified RN that pt had just walked out.   It appears that another ED RN moved the pt from [facility] Waiting Room to [facility] Results Pending accidentally after pt was entered into the waiting room after 8 minutes.

## 2017-09-13 ENCOUNTER — Other Ambulatory Visit: Payer: Self-pay | Admitting: Adult Health

## 2017-09-13 MED ORDER — VALACYCLOVIR HCL 1 G PO TABS: 1000.0000 mg | ORAL_TABLET | Freq: Every day | ORAL | 1 refills | Status: DC

## 2017-09-13 NOTE — Progress Notes (Signed)
Pt needs refills on valtrex,done

## 2017-09-29 ENCOUNTER — Other Ambulatory Visit: Payer: Self-pay

## 2017-09-29 ENCOUNTER — Emergency Department
Admission: EM | Admit: 2017-09-29 | Discharge: 2017-09-29 | Disposition: A | Discharge status: Home / Self Care | Payer: Medicaid Other | Attending: Emergency Medicine | Admitting: Emergency Medicine

## 2017-09-29 ENCOUNTER — Telehealth: Payer: Self-pay | Admitting: *Deleted

## 2017-09-29 DIAGNOSIS — Z79899 Other long term (current) drug therapy: Secondary | ICD-10-CM | POA: Insufficient documentation

## 2017-09-29 DIAGNOSIS — R300 Dysuria: Secondary | ICD-10-CM | POA: Insufficient documentation

## 2017-09-29 DIAGNOSIS — Z91040 Latex allergy status: Secondary | ICD-10-CM | POA: Insufficient documentation

## 2017-09-29 DIAGNOSIS — G8929 Other chronic pain: Secondary | ICD-10-CM | POA: Insufficient documentation

## 2017-09-29 LAB — URINALYSIS, COMPLETE (UACMP) WITH MICROSCOPIC
BACTERIA UA: NONE SEEN
BILIRUBIN URINE: NEGATIVE
Glucose, UA: NEGATIVE mg/dL
HGB URINE DIPSTICK: NEGATIVE
Ketones, ur: NEGATIVE mg/dL
NITRITE: NEGATIVE
PROTEIN: NEGATIVE mg/dL
Specific Gravity, Urine: 1.013 (ref 1.005–1.030)
pH: 6 (ref 5.0–8.0)

## 2017-09-29 LAB — WET PREP, GENITAL
Clue Cells Wet Prep HPF POC: NONE SEEN
Sperm: NONE SEEN
Trich, Wet Prep: NONE SEEN
WBC, Wet Prep HPF POC: NONE SEEN
Yeast Wet Prep HPF POC: NONE SEEN

## 2017-09-29 LAB — POCT PREGNANCY, URINE: PREG TEST UR: NEGATIVE

## 2017-09-29 MED ORDER — PHENAZOPYRIDINE HCL 200 MG PO TABS: 200.0000 mg | ORAL_TABLET | Freq: Three times a day (TID) | ORAL | 0 refills | Status: DC | PRN

## 2017-09-29 MED ORDER — ACYCLOVIR 400 MG PO TABS: 400.0000 mg | ORAL_TABLET | Freq: Two times a day (BID) | ORAL | 3 refills | Status: DC

## 2017-09-29 NOTE — ED Provider Notes (Signed)
[facility] Emergency Department Provider Note   ____________________________________________   First MD Initiated Contact with Patient 09/29/17 [phone]     (approximate)  I have reviewed the triage vital signs and the nursing notes.   HISTORY  Chief Complaint Dysuria and Vaginal Itching    HPI Rachel Stevens is a 37 y.o. female patient complain of dysuria that started this morning. Patient denies vaginal discharge. Patient has just a UTI. Patient states she had protected intercourse 3 days ago. Patient relies that the condom was latex and she is allergic to latex so she removed the condom and continue intercourse.Patient rates the pain as a 9/10. She describes the pain as "discomfort" which".   Past Medical History:  Diagnosis Date  . Chronic pelvic pain in female   . Herpes   . 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 Irregular periods 01/31/2015  . Missed period 11/01/2014  . Patient desires pregnancy 11/01/2014  . Pelvic floor tension   . Vaginal discharge 11/01/2014  . Vulvar pain   . Weight gain 11/01/2014    Patient Active Problem List   Diagnosis Date Noted  . Irregular periods 01/31/2015  . 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 Missed period 11/01/2014  . Weight gain 11/01/2014  . Vaginal discharge 11/01/2014  . Patient desires pregnancy 11/01/2014    Past Surgical History:  Procedure Laterality Date  . REDUCTION MAMMAPLASTY Right     Prior to Admission medications   Medication Sig Start Date End Date Taking? Authorizing Provider  acetaminophen (TYLENOL) 500 MG tablet Take 1,000 mg by mouth every 6 (six) hours as needed for mild pain or headache.    [provider]  Ascorbic Acid (VITAMIN C) 1000 MG tablet Take 1,000 mg by mouth daily.    [provider]  ibuprofen (ADVIL,MOTRIN) 800 MG tablet Take 1 tablet (800 mg total) by mouth 3 (three) times daily. 05/09/17   Eber Hong, MD    Multiple Vitamin (MULTIVITAMIN WITH MINERALS) TABS tablet Take 1 tablet by mouth daily.    [provider]  phenazopyridine (PYRIDIUM) 200 MG tablet Take 1 tablet (200 mg total) by mouth 3 (three) times daily as needed for pain. 09/29/17   Joni Reining, PA-C  valACYclovir (VALTREX) 1000 MG tablet Take 1 tablet (1,000 mg total) by mouth daily. 09/13/17   Adline Potter, NP    Allergies Bee venom; Latex; and Penicillins  Family History  Problem Relation Age of Onset  . Hypertension Mother   . Heart disease Mother   . Cancer Father        throat  . Congestive Heart Failure Brother   . Heart disease Maternal Grandmother   . Cancer Paternal Grandmother     Social History Social History   Tobacco Use  . Smoking status: Never Smoker  . Smokeless tobacco: Never Used  Substance Use Topics  . Alcohol use: No  . Drug use: No    Review of Systems Constitutional: No fever/chills Eyes: No visual changes. ENT: No sore throat. Cardiovascular: Denies chest pain. Respiratory: Denies shortness of breath. Gastrointestinal: No abdominal pain.  No nausea, no vomiting.  No diarrhea.  No constipation. Genitourinary: Positive for dysuria. Musculoskeletal: Negative for back pain. Skin: Negative for rash. Neurological: Negative for headaches, focal weakness or numbness. Allergic/Immunilogical: Bee sting, penicillin, and latex.  ____________________________________________   PHYSICAL EXAM:  VITAL SIGNS: ED Triage Vitals  Enc Vitals Group     BP  09/29/17 0733 122/77     Pulse Rate 09/29/17 0733 64     Resp 09/29/17 0733 18     Temp 09/29/17 0733 98.7 F (37.1 C)     Temp Source 09/29/17 0733 Oral     SpO2 09/29/17 0733 98 %     Weight 09/29/17 0734 177 lb (80.3 kg)     Height 09/29/17 0734 5\' 6"  (1.676 m)     Head Circumference --      Peak Flow --      Pain Score 09/29/17 0740 9     Pain Loc --      Pain Edu? --      Excl. in GC? --     Constitutional: Alert  and oriented. Well appearing and in no acute distress. Cardiovascular: Normal rate, regular rhythm. Grossly normal heart sounds.  Good peripheral circulation. Respiratory: Normal respiratory effort.  No retractions. Lungs CTAB. Genitourinary: Chaperoned vaginal exam reveals no external or internal lesions. No obvious discharge. Cultures taken and are pending. Musculoskeletal: No lower extremity tenderness nor edema.  No joint effusions. Neurologic:  Normal speech and language. No gross focal neurologic deficits are appreciated. No gait instability. Skin:  Skin is warm, dry and intact. No rash noted. Psychiatric: Mood and affect are normal. Speech and behavior are normal.  ____________________________________________   LABS (all labs ordered are listed, but only abnormal results are displayed)  Labs Reviewed  URINALYSIS, COMPLETE (UACMP) WITH MICROSCOPIC - Abnormal; Notable for the following components:      Result Value   Color, Urine YELLOW (*)    APPearance CLEAR (*)    Leukocytes, UA TRACE (*)    Squamous Epithelial / LPF 0-5 (*)    All other components within normal limits  WET PREP, GENITAL  POC URINE PREG, ED  POCT PREGNANCY, URINE   ____________________________________________  EKG   ____________________________________________  RADIOLOGY  No results found.  ____________________________________________   PROCEDURES  Procedure(s) performed: None  Procedures  Critical Care performed: No  ____________________________________________   INITIAL IMPRESSION / ASSESSMENT AND PLAN / ED COURSE  As part of my medical decision making, I reviewed the following data within the electronic MEDICAL RECORD NUMBER    Dysuria. Discussed lab results with patient. Patient given discharge instructions and advised take Pyridium as directed. Patient advised to follow-up "clinic if condition persists.      ____________________________________________   FINAL CLINICAL  IMPRESSION(S) / ED DIAGNOSES  Final diagnoses:  Dysuria     ED Discharge Orders        Ordered    phenazopyridine (PYRIDIUM) 200 MG tablet  3 times daily PRN     09/29/17 1029       Note:  This document was prepared using Dragon voice recognition software and may include unintentional dictation errors.    Joni ReiningSmith, Ronald K, PA-C 09/29/17 1041    Sharman CheekStafford, Phillip, MD 09/29/17 1538

## 2017-09-29 NOTE — ED Triage Notes (Signed)
Pt c/o burning with urination that started this morning around 230.  Denies any blood in urine. Has prior history of UTI.  Pt states she had protected intercourse, but has an allergy to latex and used a latex condom, pt states the condom was removed. Pt states she feels like she has a vaginal infection.

## 2017-09-29 NOTE — Telephone Encounter (Signed)
PT Complaining of valtrex not working, has had herpes outbreak and vaginal burning, she requests change in meds, she said pharmacist suggested change, will rx acyclovir if not better, will have to make appt. She is in school and is no insurance at present.

## 2018-05-11 ENCOUNTER — Telehealth: Payer: Self-pay | Admitting: *Deleted

## 2018-05-11 NOTE — Telephone Encounter (Signed)
Pt has not been seen since 2016, make appt, and if [facility] then [phone]

## 2018-05-13 ENCOUNTER — Telehealth: Payer: Self-pay | Admitting: *Deleted

## 2018-05-13 NOTE — Telephone Encounter (Signed)
Pt says having herpes interferes with nerves and outbreaks so severe that is affecting job, will need appt.

## 2018-07-04 DIAGNOSIS — Z029 Encounter for administrative examinations, unspecified: Secondary | ICD-10-CM

## 2018-09-29 ENCOUNTER — Other Ambulatory Visit: Payer: Self-pay

## 2018-09-29 ENCOUNTER — Emergency Department (HOSPITAL_COMMUNITY): Payer: Self-pay

## 2018-09-29 ENCOUNTER — Emergency Department (HOSPITAL_COMMUNITY)
Admission: EM | Admit: 2018-09-29 | Discharge: 2018-09-29 | Disposition: A | Discharge status: Home / Self Care | Payer: Self-pay | Attending: Emergency Medicine | Admitting: Emergency Medicine

## 2018-09-29 ENCOUNTER — Encounter (HOSPITAL_COMMUNITY): Payer: Self-pay | Admitting: *Deleted

## 2018-09-29 DIAGNOSIS — R55 Syncope and collapse: Secondary | ICD-10-CM

## 2018-09-29 DIAGNOSIS — Z79899 Other long term (current) drug therapy: Secondary | ICD-10-CM | POA: Insufficient documentation

## 2018-09-29 DIAGNOSIS — R42 Dizziness and giddiness: Secondary | ICD-10-CM | POA: Insufficient documentation

## 2018-09-29 LAB — I-STAT BETA HCG BLOOD, ED (MC, WL, AP ONLY): I-stat hCG, quantitative: <5 m[IU]/mL (ref <5)

## 2018-09-29 LAB — BASIC METABOLIC PANEL
Anion gap: 7 (ref 5–15)
BUN: 13 mg/dL (ref 6–20)
CHLORIDE: 105 mmol/L (ref 98–111)
CO2: 24 mmol/L (ref 22–32)
CREATININE: 0.85 mg/dL (ref 0.44–1.00)
Calcium: 8.8 mg/dL — ABNORMAL LOW (ref 8.9–10.3)
GFR calc Af Amer: >60 mL/min (ref >60)
GFR calc non Af Amer: >60 mL/min (ref >60)
GLUCOSE: 96 mg/dL (ref 70–99)
Potassium: 3.1 mmol/L — ABNORMAL LOW (ref 3.5–5.1)
Sodium: 136 mmol/L (ref 135–145)

## 2018-09-29 LAB — CBC WITH DIFFERENTIAL/PLATELET
Abs Immature Granulocytes: 0.06 10*3/uL (ref 0.00–0.07)
BASOS ABS: 0 10*3/uL (ref 0.0–0.1)
Basophils Relative: 0 %
EOS ABS: 0 10*3/uL (ref 0.0–0.5)
EOS PCT: 0 %
HCT: 37.5 % (ref 36.0–46.0)
HEMOGLOBIN: 11.9 g/dL — AB (ref 12.0–15.0)
IMMATURE GRANULOCYTES: 1 %
LYMPHS PCT: 21 %
Lymphs Abs: 2.2 10*3/uL (ref 0.7–4.0)
MCH: 28.3 pg (ref 26.0–34.0)
MCHC: 31.7 g/dL (ref 30.0–36.0)
MCV: 89.3 fL (ref 80.0–100.0)
MONOS PCT: 8 %
Monocytes Absolute: 0.8 10*3/uL (ref 0.1–1.0)
NRBC: 0 % (ref 0.0–0.2)
Neutro Abs: 7.2 10*3/uL (ref 1.7–7.7)
Neutrophils Relative %: 70 %
Platelets: 253 10*3/uL (ref 150–400)
RBC: 4.2 MIL/uL (ref 3.87–5.11)
RDW: 12.3 % (ref 11.5–15.5)
WBC: 10.3 10*3/uL (ref 4.0–10.5)

## 2018-09-29 MED ORDER — SODIUM CHLORIDE 0.9 % IV BOLUS
1000.0000 mL | Freq: Once | INTRAVENOUS | Status: AC
  Administered 2018-09-29: 1000 mL via INTRAVENOUS

## 2018-09-29 NOTE — ED Triage Notes (Signed)
Pt states she has been dizzy times one week and states tonight at work she fell from the dizziness

## 2018-09-29 NOTE — ED Notes (Signed)
MD at bedside. 

## 2018-09-29 NOTE — ED Provider Notes (Signed)
[facility] EMERGENCY DEPARTMENT Provider Note   CSN: [id number] Arrival date & time: 09/29/18  0557     History   Chief Complaint Chief Complaint  Patient presents with  . Dizziness    HPI Unk [name] is a 38 y.o. female.  Patient is a 38 year old female with no significant past medical history.  She presents with a one-week history of episodic dizziness.  She reports several times per day feeling lightheaded and as if she was going to pass out.  This morning she had a another episode at work.  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  She denies feeling a spinning sensation.  She denies any palpitations during these episodes.  She denies any black or bloody stools.  The history is provided by the patient.  Dizziness  Quality:  Lightheadedness Severity:  Moderate Duration:  1 week Timing:  Intermittent Progression:  Worsening Chronicity:  New Relieved by:  Nothing Worsened by:  Nothing Ineffective treatments:  None tried Associated symptoms: no blood in stool and no shortness of breath     Past Medical History:  Diagnosis Date  . Chronic pelvic pain in female   . Herpes   . 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 Irregular periods 01/31/2015  . Missed period 11/01/2014  . Patient desires pregnancy 11/01/2014  . Pelvic floor tension   . Vaginal discharge 11/01/2014  . Vulvar pain   . Weight gain 11/01/2014    Patient Active Problem List   Diagnosis Date Noted  . Irregular periods 01/31/2015  . 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 Missed period 11/01/2014  . Weight gain 11/01/2014  . Vaginal discharge 11/01/2014  . Patient desires pregnancy 11/01/2014    Past Surgical History:  Procedure Laterality Date  . REDUCTION MAMMAPLASTY Right      OB History    Gravida  0   Para      Term      Preterm      AB      Living        SAB      TAB      Ectopic      Multiple      Live Births               Home Medications    Prior to Admission medications   Medication Sig Start Date End Date Taking? Authorizing Provider  acetaminophen (TYLENOL) 500 MG tablet Take 1,000 mg by mouth every 6 (six) hours as needed for mild pain or headache.    [provider]  acyclovir (ZOVIRAX) 400 MG tablet Take 1 tablet (400 mg total) by mouth 2 (two) times daily. 09/29/17   Adline PotterGriffin, Jennifer A, NP  Ascorbic Acid (VITAMIN C) 1000 MG tablet Take 1,000 mg by mouth daily.    [provider]  ibuprofen (ADVIL,MOTRIN) 800 MG tablet Take 1 tablet (800 mg total) by mouth 3 (three) times daily. 05/09/17   Eber HongMiller, Brian, MD  Multiple Vitamin (MULTIVITAMIN WITH MINERALS) TABS tablet Take 1 tablet by mouth daily.    [provider]  phenazopyridine (PYRIDIUM) 200 MG tablet Take 1 tablet (200 mg total) by mouth 3 (three) times daily as needed for pain. 09/29/17   Joni ReiningSmith, Ronald K, PA-C    Family History Family History  Problem Relation Age of Onset  . Hypertension Mother   . Heart disease Mother   . Cancer Father  throat  . Congestive Heart Failure Brother   . Heart disease Maternal Grandmother   . Cancer Paternal Grandmother     Social History Social History   Tobacco Use  . Smoking status: Never Smoker  . Smokeless tobacco: Never Used  Substance Use Topics  . Alcohol use: No  . Drug use: No     Allergies   Bee venom; Latex; and Penicillins   Review of Systems Review of Systems  Respiratory: Negative for shortness of breath.   Gastrointestinal: Negative for blood in stool.  Neurological: Positive for dizziness.  All other systems reviewed and are negative.    Physical Exam Updated Vital Signs BP 120/76 (BP Location: Left Arm)   Pulse 85   Temp 98.6 F (37 C) (Oral)   Resp 14   Ht 5\' 6"  (1.676 m)   Wt 77.1 kg   LMP 09/08/2018   SpO2 100%   BMI 27.44 kg/m   Physical Exam Vitals signs and nursing note reviewed.    Constitutional:      General: She is not in acute distress.    Appearance: Normal appearance. She is well-developed. She is not ill-appearing, toxic-appearing or diaphoretic.  HENT:     Head: Normocephalic and atraumatic.  Eyes:     Extraocular Movements: Extraocular movements intact.     Pupils: Pupils are equal, round, and reactive to light.  Neck:     Musculoskeletal: Normal range of motion and neck supple.  Cardiovascular:     Rate and Rhythm: Normal rate and regular rhythm.     Heart sounds: No murmur. No friction rub. No gallop.   Pulmonary:     Effort: Pulmonary effort is normal. No respiratory distress.     Breath sounds: Normal breath sounds. No wheezing.  Abdominal:     General: Bowel sounds are normal. There is no distension.     Palpations: Abdomen is soft.     Tenderness: There is no abdominal tenderness.  Musculoskeletal: Normal range of motion.        General: No swelling or tenderness.  Skin:    General: Skin is warm and dry.  Neurological:     General: No focal deficit present.     Mental Status: She is alert and oriented to person, place, and time.     Cranial Nerves: No cranial nerve deficit.      ED Treatments / Results  Labs (all labs ordered are listed, but only abnormal results are displayed) Labs Reviewed  BASIC METABOLIC PANEL  CBC WITH DIFFERENTIAL/PLATELET  I-STAT BETA HCG BLOOD, ED ([facility], [facility], [facility] ONLY)    EKG None  Radiology No results found.  Procedures Procedures (including critical care time)  Medications Ordered in ED Medications  sodium chloride 0.9 % bolus 1,000 mL (has no administration in time range)     Initial Impression / Assessment and Plan / ED Course  I have reviewed the triage vital signs and the nursing notes.  Pertinent labs & imaging results that were available during my care of the patient were reviewed by me and considered in my medical decision making (see chart for details).  Patient presents here with  complaints of dizziness.  This is been occurring episodically over the past week.  Her neurologic exam is nonfocal and physical examination is also otherwise unremarkable.  Work-up today shows no significant laboratory abnormalities, negative pregnancy test, and essentially normal EKG.  Head CT is negative.  She has been observed on the monitor and has had  no ectopy or arrhythmia.  I am uncertain as to the exact etiology of the symptoms.  They do not sound vertiginous.  At this point, I feel as though patient is appropriate for discharge.  I will give her the follow-up information for cardiology to discuss an event monitor if her episodes persist.  Final Clinical Impressions(s) / ED Diagnoses   Final diagnoses:  None    ED Discharge Orders    None       Geoffery Lyons, MD 09/29/18 [phone]

## 2018-09-29 NOTE — Discharge Instructions (Signed)
Drink plenty of fluids and get plenty of rest.  If your symptoms persist, follow-up with cardiology to discuss wearing  an event monitor.  The contact information for the [facility] cardiology clinic has been provided in this discharge summary for you to call and make these arrangements.  Return to the emergency department if symptoms significantly worsen or change.

## 2018-09-29 NOTE — ED Notes (Signed)
Pt back from CT

## 2018-11-07 ENCOUNTER — Emergency Department (HOSPITAL_COMMUNITY): Payer: Self-pay

## 2018-11-07 ENCOUNTER — Other Ambulatory Visit: Payer: Self-pay

## 2018-11-07 ENCOUNTER — Encounter (HOSPITAL_COMMUNITY): Payer: Self-pay

## 2018-11-07 ENCOUNTER — Emergency Department (HOSPITAL_COMMUNITY)
Admission: EM | Admit: 2018-11-07 | Discharge: 2018-11-07 | Disposition: A | Discharge status: Home / Self Care | Payer: Self-pay | Attending: Emergency Medicine | Admitting: Emergency Medicine

## 2018-11-07 DIAGNOSIS — R0789 Other chest pain: Secondary | ICD-10-CM | POA: Insufficient documentation

## 2018-11-07 DIAGNOSIS — Z79899 Other long term (current) drug therapy: Secondary | ICD-10-CM | POA: Insufficient documentation

## 2018-11-07 DIAGNOSIS — Z91040 Latex allergy status: Secondary | ICD-10-CM | POA: Insufficient documentation

## 2018-11-07 DIAGNOSIS — Z760 Encounter for issue of repeat prescription: Secondary | ICD-10-CM | POA: Insufficient documentation

## 2018-11-07 LAB — BASIC METABOLIC PANEL
Anion gap: 9 (ref 5–15)
BUN: 11 mg/dL (ref 6–20)
CHLORIDE: 105 mmol/L (ref 98–111)
CO2: 25 mmol/L (ref 22–32)
CREATININE: 0.76 mg/dL (ref 0.44–1.00)
Calcium: 8.9 mg/dL (ref 8.9–10.3)
GFR calc Af Amer: >60 mL/min (ref >60)
GFR calc non Af Amer: >60 mL/min (ref >60)
GLUCOSE: 96 mg/dL (ref 70–99)
Potassium: 3.6 mmol/L (ref 3.5–5.1)
Sodium: 139 mmol/L (ref 135–145)

## 2018-11-07 LAB — CBC WITH DIFFERENTIAL/PLATELET
Abs Immature Granulocytes: 0.03 10*3/uL (ref 0.00–0.07)
Basophils Absolute: 0 10*3/uL (ref 0.0–0.1)
Basophils Relative: 0 %
Eosinophils Absolute: 0.1 10*3/uL (ref 0.0–0.5)
Eosinophils Relative: 1 %
HEMATOCRIT: 37.1 % (ref 36.0–46.0)
HEMOGLOBIN: 11.6 g/dL — AB (ref 12.0–15.0)
IMMATURE GRANULOCYTES: 0 %
LYMPHS ABS: 2.1 10*3/uL (ref 0.7–4.0)
LYMPHS PCT: 27 %
MCH: 27.6 pg (ref 26.0–34.0)
MCHC: 31.3 g/dL (ref 30.0–36.0)
MCV: 88.1 fL (ref 80.0–100.0)
MONO ABS: 0.5 10*3/uL (ref 0.1–1.0)
MONOS PCT: 6 %
NEUTROS ABS: 5 10*3/uL (ref 1.7–7.7)
Neutrophils Relative %: 66 %
Platelets: 295 10*3/uL (ref 150–400)
RBC: 4.21 MIL/uL (ref 3.87–5.11)
RDW: 12.6 % (ref 11.5–15.5)
WBC: 7.7 10*3/uL (ref 4.0–10.5)
nRBC: 0 % (ref 0.0–0.2)

## 2018-11-07 LAB — TROPONIN I

## 2018-11-07 LAB — I-STAT BETA HCG BLOOD, ED (MC, WL, AP ONLY): I-stat hCG, quantitative: <5 m[IU]/mL (ref <5)

## 2018-11-07 MED ORDER — ACETAMINOPHEN 325 MG PO TABS
650.0000 mg | ORAL_TABLET | Freq: Once | ORAL | Status: AC
  Administered 2018-11-07: 650 mg via ORAL
  Filled 2018-11-07: qty 2

## 2018-11-07 MED ORDER — ALUM & MAG HYDROXIDE-SIMETH 200-200-20 MG/5ML PO SUSP
30.0000 mL | Freq: Once | ORAL | Status: AC
  Administered 2018-11-07: 30 mL via ORAL
  Filled 2018-11-07: qty 30

## 2018-11-07 MED ORDER — METHOCARBAMOL 500 MG PO TABS
1000.0000 mg | ORAL_TABLET | Freq: Once | ORAL | Status: AC
  Administered 2018-11-07: 1000 mg via ORAL
  Filled 2018-11-07: qty 2

## 2018-11-07 MED ORDER — PANTOPRAZOLE SODIUM 20 MG PO TBEC: 20.0000 mg | DELAYED_RELEASE_TABLET | Freq: Every day | ORAL | 0 refills | Status: DC

## 2018-11-07 MED ORDER — METHOCARBAMOL 500 MG PO TABS: 1000.0000 mg | ORAL_TABLET | Freq: Four times a day (QID) | ORAL | 0 refills | Status: DC | PRN

## 2018-11-07 MED ORDER — NAPROXEN 250 MG PO TABS: 250.0000 mg | ORAL_TABLET | Freq: Two times a day (BID) | ORAL | 0 refills | Status: DC | PRN

## 2018-11-07 MED ORDER — IBUPROFEN 400 MG PO TABS
400.0000 mg | ORAL_TABLET | Freq: Once | ORAL | Status: AC
  Administered 2018-11-07: 400 mg via ORAL
  Filled 2018-11-07: qty 1

## 2018-11-07 NOTE — ED Triage Notes (Signed)
Pt reports pain and fullness in left chest since Friday.  Pt says the symptoms started after eating lasagna.  Pt also c/o pain in left side of neck and left shoulder.  Reports pain worse when she moves her left arm.

## 2018-11-07 NOTE — Discharge Instructions (Signed)
Take the prescriptions as directed.  Apply moist heat or ice to the area(s) of discomfort, for 15 minutes at a time, several times per day for the next few days.  Do not fall asleep on a heating or ice pack.  Call your regular medical doctor today to schedule a follow up appointment this week.  Return to the Emergency Department immediately if worsening. ° °

## 2018-11-07 NOTE — ED Provider Notes (Signed)
[facility] EMERGENCY DEPARTMENT Provider Note   CSN: [id number] Arrival date & time: 11/07/18  [id number]     History   Chief Complaint Chief Complaint  Patient presents with  . Chest Pain  . Shoulder Pain    HPI Rachel Stevens is a 38 y.o. female.  HPI  Pt was seen at 0815. Per pt, c/o gradual onset and persistence of constant left upper chest wall "pain" for the past 3 days. Pt states the pain began after she was "laying" on that side; though also states that it may have occurred after eating lasagna. Pt states she has run out of her protonix and is requesting a refill. Describes the pain as "spasms." Pain worsens with palpation of the area and movement of her left arm. Denies fevers, no rash, no injury, no palpitations, no SOB/cough, no abd pain, no N/V/D, no focal motor weakness, no tingling/numbness in extremities, no neck or back pain.    Past Medical History:  Diagnosis Date  . Chronic pelvic pain in female   . Herpes   . 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 Irregular periods 01/31/2015  . Missed period 11/01/2014  . Patient desires pregnancy 11/01/2014  . Pelvic floor tension   . Vaginal discharge 11/01/2014  . Vulvar pain   . Weight gain 11/01/2014    Patient Active Problem List   Diagnosis Date Noted  . Irregular periods 01/31/2015  . 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 Missed period 11/01/2014  . Weight gain 11/01/2014  . Vaginal discharge 11/01/2014  . Patient desires pregnancy 11/01/2014    Past Surgical History:  Procedure Laterality Date  . REDUCTION MAMMAPLASTY Right      OB History    Gravida  0   Para      Term      Preterm      AB      Living        SAB      TAB      Ectopic      Multiple      Live Births               Home Medications    Prior to Admission medications   Medication Sig Start Date End Date Taking? Authorizing Provider  acetaminophen (TYLENOL) 500 MG tablet Take 1,000 mg by  mouth every 6 (six) hours as needed for mild pain or headache.    [provider]  acyclovir (ZOVIRAX) 400 MG tablet Take 1 tablet (400 mg total) by mouth 2 (two) times daily. 09/29/17   Adline PotterGriffin, Jennifer A, NP  Ascorbic Acid (VITAMIN C) 1000 MG tablet Take 1,000 mg by mouth daily.    [provider]  ibuprofen (ADVIL,MOTRIN) 800 MG tablet Take 1 tablet (800 mg total) by mouth 3 (three) times daily. 05/09/17   Eber HongMiller, Brian, MD  Multiple Vitamin (MULTIVITAMIN WITH MINERALS) TABS tablet Take 1 tablet by mouth daily.    [provider]  phenazopyridine (PYRIDIUM) 200 MG tablet Take 1 tablet (200 mg total) by mouth 3 (three) times daily as needed for pain. 09/29/17   Joni ReiningSmith, Ronald K, PA-C    Family History Family History  Problem Relation Age of Onset  . Hypertension Mother   . Heart disease Mother   . Cancer Father        throat  . Congestive Heart Failure Brother   . Heart disease Maternal Grandmother   . Cancer Paternal Grandmother  Social History Social History   Tobacco Use  . Smoking status: Never Smoker  . Smokeless tobacco: Never Used  Substance Use Topics  . Alcohol use: No  . Drug use: No     Allergies   Bee venom; Latex; and Penicillins   Review of Systems Review of Systems ROS: Statement: All systems negative except as marked or noted in the HPI; Constitutional: Negative for fever and chills. ; ; Eyes: Negative for eye pain, redness and discharge. ; ; ENMT: Negative for ear pain, hoarseness, nasal congestion, sinus pressure and sore throat. ; ; Cardiovascular: Negative for chest pain, palpitations, diaphoresis, dyspnea and peripheral edema. ; ; Respiratory: Negative for cough, wheezing and stridor. ; ; Gastrointestinal: Negative for nausea, vomiting, diarrhea, abdominal pain, blood in stool, hematemesis, jaundice and rectal bleeding. . ; ; Genitourinary: Negative for dysuria, flank pain and hematuria. ; ; Musculoskeletal: +chest wall pain.  Negative for back pain and neck pain. Negative for swelling and trauma.; ; Skin: Negative for pruritus, rash, abrasions, blisters, bruising and skin lesion.; ; Neuro: Negative for headache, lightheadedness and neck stiffness. Negative for weakness, altered level of consciousness, altered mental status, extremity weakness, paresthesias, involuntary movement, seizure and syncope.       Physical Exam Updated Vital Signs BP 118/85 (BP Location: Left Arm)   Pulse 65   Temp 99.4 F (37.4 C) (Oral)   Resp 20   Ht 5\' 6"  (1.676 m)   Wt 78.5 kg   LMP 11/03/2018   SpO2 100%   BMI 27.92 kg/m   Physical Exam 0820: Physical examination:  Nursing notes reviewed; Vital signs and O2 SAT reviewed;  Constitutional: Well developed, Well nourished, Well hydrated, In no acute distress; Head:  Normocephalic, atraumatic; Eyes: EOMI, PERRL, No scleral icterus; ENMT: Mouth and pharynx normal, Mucous membranes moist; Neck: Supple, Full range of motion, No lymphadenopathy; Cardiovascular: Regular rate and rhythm, No gallop; Respiratory: Breath sounds clear & equal bilaterally, No wheezes.  Speaking full sentences with ease, Normal respiratory effort/excursion; Chest: +left upper chest wall tender to palp. No soft tissue crepitus, no deformity. Movement normal; Abdomen: Soft, Nontender, Nondistended, Normal bowel sounds; Genitourinary: No CVA tenderness; Spine:  No midline CS, TS, LS tenderness. +TTP left hypertonic trapezius muscle.;;  Extremities: Peripheral pulses normal, NT left shoulder/elbow. No tenderness, No edema, No calf edema or asymmetry.; Neuro: AA&Ox3, Major CN grossly intact.  Speech clear. No gross focal motor or sensory deficits in extremities.; Skin: Color normal, Warm, Dry.   ED Treatments / Results  Labs (all labs ordered are listed, but only abnormal results are displayed)   EKG EKG Interpretation  Date/Time:  Monday November 07 2018 08:14:31 EST Ventricular Rate:  68 PR Interval:    QRS  Duration: 89 QT Interval:  395 QTC Calculation: 421 R Axis:   22 Text Interpretation:  Sinus rhythm Borderline short PR interval Low voltage, precordial leads When compared with ECG of 09/29/2018 No significant change was found Confirmed by Samuel Jester [phone]) on 11/07/2018 8:40:49 AM   Radiology   Procedures Procedures (including critical care time)  Medications Ordered in ED Medications  acetaminophen (TYLENOL) tablet 650 mg (has no administration in time range)  ibuprofen (ADVIL,MOTRIN) tablet 400 mg (has no administration in time range)  methocarbamol (ROBAXIN) tablet 1,000 mg (has no administration in time range)  alum & mag hydroxide-simeth (MAALOX/MYLANTA) 200-200-20 MG/5ML suspension 30 mL (has no administration in time range)     Initial Impression / Assessment and Plan / ED Course  I have reviewed the triage vital signs and the nursing notes.  Pertinent labs & imaging results that were available during my care of the patient were reviewed by me and considered in my medical decision making (see chart for details).  MDM Reviewed: previous chart, nursing note and vitals Reviewed previous: labs and ECG Interpretation: labs, ECG and x-ray   Results for orders placed or performed during the hospital encounter of 11/07/18  Basic metabolic panel  Result Value Ref Range   Sodium 139 135 - 145 mmol/L   Potassium 3.6 3.5 - 5.1 mmol/L   Chloride 105 98 - 111 mmol/L   CO2 25 22 - 32 mmol/L   Glucose, Bld 96 70 - 99 mg/dL   BUN 11 6 - 20 mg/dL   Creatinine, Ser 1.610.76 0.44 - 1.00 mg/dL   Calcium 8.9 8.9 - 09.610.3 mg/dL   GFR calc non Af Amer >60 >60 mL/min   GFR calc Af Amer >60 >60 mL/min   Anion gap 9 5 - 15  Troponin I - Once  Result Value Ref Range   Troponin I <0.03 <0.03 ng/mL  CBC with Differential  Result Value Ref Range   WBC 7.7 4.0 - 10.5 K/uL   RBC 4.21 3.87 - 5.11 MIL/uL   Hemoglobin 11.6 (L) 12.0 - 15.0 g/dL   HCT 04.537.1 40.936.0 - 81.146.0 %   MCV 88.1 80.0 -  100.0 fL   MCH 27.6 26.0 - 34.0 pg   MCHC 31.3 30.0 - 36.0 g/dL   RDW 91.412.6 78.211.5 - 95.615.5 %   Platelets 295 150 - 400 K/uL   nRBC 0.0 0.0 - 0.2 %   Neutrophils Relative % 66 %   Neutro Abs 5.0 1.7 - 7.7 K/uL   Lymphocytes Relative 27 %   Lymphs Abs 2.1 0.7 - 4.0 K/uL   Monocytes Relative 6 %   Monocytes Absolute 0.5 0.1 - 1.0 K/uL   Eosinophils Relative 1 %   Eosinophils Absolute 0.1 0.0 - 0.5 K/uL   Basophils Relative 0 %   Basophils Absolute 0.0 0.0 - 0.1 K/uL   Immature Granulocytes 0 %   Abs Immature Granulocytes 0.03 0.00 - 0.07 K/uL  I-Stat beta hCG blood, ED  Result Value Ref Range   I-stat hCG, quantitative <5.0 <5 mIU/mL   Comment 3           Dg Chest 2 View Result Date: 11/07/2018 CLINICAL DATA:  Left upper chest wall spasms. EXAM: CHEST - 2 VIEW COMPARISON:  10/25/2016 FINDINGS: Normal heart size and mediastinal contours. No acute infiltrate or edema. No effusion or pneumothorax. No acute osseous findings. IMPRESSION: Negative chest. Electronically Signed   By: Marnee SpringJonathon  Watts M.D.   On: 11/07/2018 10:16     1025: Doubt PE as cause for symptoms with PERC negative. Doubt ACS as cause for symptoms with normal troponin and unchanged EKG from previous after 3 days of constant atypical symptoms. Tx symptomatically at this time. Pt requesting refill of her Protonix. Dx and testing d/w pt.  Questions answered.  Verb understanding, agreeable to d/c home with outpt f/u.      Final Clinical Impressions(s) / ED Diagnoses   Final diagnoses:  None    ED Discharge Orders    None       Samuel JesterMcManus, Juley Giovanetti, DO 11/09/18 1904

## 2019-03-02 LAB — HM HIV SCREENING LAB: HM HIV Screening: NEGATIVE

## 2019-04-12 ENCOUNTER — Other Ambulatory Visit: Payer: Self-pay

## 2019-04-12 ENCOUNTER — Emergency Department
Admission: EM | Admit: 2019-04-12 | Discharge: 2019-04-12 | Disposition: A | Discharge status: Home / Self Care | Payer: Self-pay | Attending: Emergency Medicine | Admitting: Emergency Medicine

## 2019-04-12 ENCOUNTER — Encounter: Payer: Self-pay | Admitting: Emergency Medicine

## 2019-04-12 DIAGNOSIS — R42 Dizziness and giddiness: Secondary | ICD-10-CM | POA: Insufficient documentation

## 2019-04-12 DIAGNOSIS — R5383 Other fatigue: Secondary | ICD-10-CM | POA: Insufficient documentation

## 2019-04-12 LAB — CBC
HCT: 36.2 % (ref 36.0–46.0)
Hemoglobin: 11.5 g/dL — ABNORMAL LOW (ref 12.0–15.0)
MCH: 27.7 pg (ref 26.0–34.0)
MCHC: 31.8 g/dL (ref 30.0–36.0)
MCV: 87.2 fL (ref 80.0–100.0)
Platelets: 257 10*3/uL (ref 150–400)
RBC: 4.15 MIL/uL (ref 3.87–5.11)
RDW: 13.3 % (ref 11.5–15.5)
WBC: 8.5 10*3/uL (ref 4.0–10.5)
nRBC: 0 % (ref 0.0–0.2)

## 2019-04-12 LAB — URINALYSIS, COMPLETE (UACMP) WITH MICROSCOPIC
Bilirubin Urine: NEGATIVE
Glucose, UA: NEGATIVE mg/dL
Hgb urine dipstick: NEGATIVE
Ketones, ur: NEGATIVE mg/dL
Leukocytes,Ua: NEGATIVE
Nitrite: NEGATIVE
Protein, ur: NEGATIVE mg/dL
Specific Gravity, Urine: 1.016 (ref 1.005–1.030)
pH: 5 (ref 5.0–8.0)

## 2019-04-12 LAB — BASIC METABOLIC PANEL
Anion gap: 6 (ref 5–15)
BUN: 10 mg/dL (ref 6–20)
CO2: 25 mmol/L (ref 22–32)
Calcium: 9.1 mg/dL (ref 8.9–10.3)
Chloride: 108 mmol/L (ref 98–111)
Creatinine, Ser: 0.72 mg/dL (ref 0.44–1.00)
GFR calc Af Amer: >60 mL/min (ref >60)
GFR calc non Af Amer: >60 mL/min (ref >60)
Glucose, Bld: 108 mg/dL — ABNORMAL HIGH (ref 70–99)
Potassium: 3.7 mmol/L (ref 3.5–5.1)
Sodium: 139 mmol/L (ref 135–145)

## 2019-04-12 LAB — POCT PREGNANCY, URINE: Preg Test, Ur: NEGATIVE

## 2019-04-12 MED ORDER — MECLIZINE HCL 25 MG PO TABS: 25.0000 mg | ORAL_TABLET | Freq: Three times a day (TID) | ORAL | 1 refills | Status: DC | PRN

## 2019-04-12 NOTE — ED Triage Notes (Signed)
Patient presents to the ED with fatigue, sore throat, body aches since Friday.  Patient states, "I can sleep a whole 8 hours wake up and want to go back to bed."  Patient also reports sudden episodes of dizziness for approx. 1 year.  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

## 2019-04-12 NOTE — ED Notes (Signed)
First RN Note: Pt presents to ED via POV with multiple complaints, pt states, "I just don't feel well". Pt states "well I'm fatigued, but there's a lot of things wrong, but I just don't feel well". Pt ambulatory to the front desk with a steady gait, NAD noted at this time.

## 2019-04-12 NOTE — ED Provider Notes (Signed)
[facility] Emergency Department Provider Note       Time seen: ----------------------------------------- 8:51 AM on 04/12/2019 -----------------------------------------   I have reviewed the triage vital signs and the nursing notes.  HISTORY   Chief Complaint Fatigue and Sore Throat    HPI Rachel Stevens is a 38 y.o. female with a history of chronic pelvic pain who presents to the ED for fatigue, sore throat, body aches since Friday.  Patient states she can sleep 8 hours then wake up in 1 to go back to bed.  She also reports sudden episodes of dizziness for approximately a year.  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  Past Medical History:  Diagnosis Date  . Chronic pelvic pain in female   . Herpes   . 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 Irregular periods 01/31/2015  . Missed period 11/01/2014  . Patient desires pregnancy 11/01/2014  . Pelvic floor tension   . Vaginal discharge 11/01/2014  . Vulvar pain   . Weight gain 11/01/2014    Patient Active Problem List   Diagnosis Date Noted  . Irregular periods 01/31/2015  . 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 Missed period 11/01/2014  . Weight gain 11/01/2014  . Vaginal discharge 11/01/2014  . Patient desires pregnancy 11/01/2014    Past Surgical History:  Procedure Laterality Date  . REDUCTION MAMMAPLASTY Right     Allergies Bee venom, Latex, and Penicillins  Social History Social History   Tobacco Use  . Smoking status: Never Smoker  . Smokeless tobacco: Never Used  Substance Use Topics  . Alcohol use: No  . Drug use: No    Review of Systems Constitutional: Negative for fever. HEENT: Positive for sore throat Cardiovascular: Negative for chest pain. Respiratory: Negative for shortness of breath. Gastrointestinal: Negative for abdominal pain, vomiting and diarrhea. Musculoskeletal: Negative for back  pain. Skin: Negative for rash. Neurological: Positive for fatigue and dizziness  All systems negative/normal/unremarkable except as stated in the HPI  ____________________________________________   PHYSICAL EXAM:  VITAL SIGNS: ED Triage Vitals  Enc Vitals Group     BP 04/12/19 0731 118/81     Pulse Rate 04/12/19 0731 68     Resp 04/12/19 0731 18     Temp 04/12/19 0731 98.4 F (36.9 C)     Temp Source 04/12/19 0731 Oral     SpO2 04/12/19 0731 96 %     Weight 04/12/19 0732 173 lb (78.5 kg)     Height 04/12/19 0732 5\' 6"  (1.676 m)     Head Circumference --      Peak Flow --      Pain Score 04/12/19 0740 7     Pain Loc --      Pain Edu? --      Excl. in GC? --    Constitutional: Alert and oriented. Well appearing and in no distress. Eyes: Conjunctivae are normal. Normal extraocular movements. ENT      Head: Normocephalic and atraumatic.      Nose: No congestion/rhinnorhea.      Mouth/Throat: Mucous membranes are moist.      Neck: No stridor. Cardiovascular: Normal rate, regular rhythm. No murmurs, rubs, or gallops. Respiratory: Normal respiratory effort without tachypnea nor retractions. Breath sounds are clear and equal bilaterally. No wheezes/rales/rhonchi. Gastrointestinal: Soft and nontender. Normal bowel sounds Musculoskeletal: Nontender with normal range of motion in extremities. No lower extremity tenderness  nor edema. Neurologic:  Normal speech and language. No gross focal neurologic deficits are appreciated.  Skin:  Skin is warm, dry and intact. No rash noted. Psychiatric: Mood and affect are normal. Speech and behavior are normal.  ____________________________________________  EKG: Interpreted by me.  Unusual P axis, rate of 66 bpm, low voltage, normal QRS axis, normal QT  ____________________________________________  ED COURSE:  As part of my medical decision making, I reviewed the following data within the [place] History obtained from  family if available, nursing notes, old chart and ekg, as well as notes from prior ED visits. Patient presented for fatigue and sore throat, we will assess with labs and imaging as indicated at this time.   Procedures  Rachel Stevens was evaluated in Emergency Department on 04/12/2019 for the symptoms described in the history of present illness. She was evaluated in the context of the global COVID-19 pandemic, which necessitated consideration that the patient might be at risk for infection with the SARS-CoV-2 virus that causes COVID-19. Institutional protocols and algorithms that pertain to the evaluation of patients at risk for COVID-19 are in a state of rapid change based on information released by regulatory bodies including the CDC and federal and state organizations. These policies and algorithms were followed during the patient's care in the ED.  ____________________________________________   LABS (pertinent positives/negatives)  Labs Reviewed  BASIC METABOLIC PANEL - Abnormal; Notable for the following components:      Result Value   Glucose, Bld 108 (*)    All other components within normal limits  CBC - Abnormal; Notable for the following components:   Hemoglobin 11.5 (*)    All other components within normal limits  URINALYSIS, COMPLETE (UACMP) WITH MICROSCOPIC - Abnormal; Notable for the following components:   Color, Urine YELLOW (*)    APPearance HAZY (*)    Bacteria, UA RARE (*)    All other components within normal limits  NOVEL CORONAVIRUS, NAA (HOSPITAL ORDER, SEND-OUT TO REF LAB)  POCT PREGNANCY, URINE  CBG MONITORING, ED  POC URINE PREG, ED  ____________________________________________   DIFFERENTIAL DIAGNOSIS   Anxiety, depression, sleep disorder, peripheral vertigo, dehydration, electrolyte abnormality  FINAL ASSESSMENT AND PLAN  Fatigue, dizziness   Plan: The patient had presented for multiple complaints. Patient's labs are reassuring.  She has had normal CT  imaging of her brain this year.  I will try meclizine, refer her to ENT and possibly for a sleep study as well.  She is cleared for outpatient follow-up.   Laurence Aly, MD    Note: This note was generated in part or whole with voice recognition software. Voice recognition is usually quite accurate but there are transcription errors that can and very often do occur. I apologize for any typographical errors that were not detected and corrected.     Earleen Newport, MD 04/12/19 [phone]

## 2019-04-12 NOTE — ED Notes (Signed)
Pt refused covid19 test. Pt instructed to return to ed if she has SOB, fever, any concerns.

## 2019-05-12 ENCOUNTER — Ambulatory Visit: Payer: Self-pay

## 2019-05-17 ENCOUNTER — Ambulatory Visit: Payer: Self-pay

## 2019-05-18 ENCOUNTER — Ambulatory Visit: Payer: Self-pay | Admitting: Advanced Practice Midwife

## 2019-05-18 ENCOUNTER — Other Ambulatory Visit: Payer: Self-pay | Admitting: Advanced Practice Midwife

## 2019-05-18 ENCOUNTER — Encounter: Payer: Self-pay | Admitting: Physician Assistant

## 2019-05-18 ENCOUNTER — Other Ambulatory Visit: Payer: Self-pay

## 2019-05-18 DIAGNOSIS — R4586 Emotional lability: Secondary | ICD-10-CM

## 2019-05-18 DIAGNOSIS — Z113 Encounter for screening for infections with a predominantly sexual mode of transmission: Secondary | ICD-10-CM

## 2019-05-18 LAB — WET PREP FOR TRICH, YEAST, CLUE
Trichomonas Exam: NEGATIVE
Yeast Exam: NEGATIVE

## 2019-05-18 MED ORDER — METRONIDAZOLE 1 % EX GEL: Freq: Every day | CUTANEOUS | 0 refills | Status: DC

## 2019-05-18 NOTE — Addendum Note (Signed)
Addended by: Debera Lat on: 05/18/2019 12:04 PM   Modules accepted: Orders

## 2019-05-18 NOTE — Progress Notes (Signed)
    STI clinic/screening visit  Subjective:  Rachel Stevens is a 38 y.o.G2P0 female being seen today for an STI screening visit. The patient reports they do have symptoms.  Patient has the following medical conditions:   Patient Active Problem List   Diagnosis Date Noted  . Routine screening for STI (sexually transmitted infection) 05/18/2019  . Depression 07/05/2015  . Irregular periods 01/31/2015  . 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 Weight gain 11/01/2014  . Vaginal discharge 11/01/2014     Chief Complaint  Patient presents with  . SEXUALLY TRANSMITTED DISEASE    HPI  Patient reports malodor and intermittent mild low abdominal cramps onset 05/05/19  See flowsheet for further details and programmatic requirements.    The following portions of the patient's history were reviewed and updated as appropriate: allergies, current medications, past medical history, past social history, past surgical history and problem list.  Objective:  There were no vitals filed for this visit.  Physical Exam Constitutional:      Appearance: Normal appearance.  HENT:     Head: Normocephalic.     Mouth/Throat:     Mouth: Mucous membranes are moist.     Pharynx: No oropharyngeal exudate or posterior oropharyngeal erythema.  Eyes:     Conjunctiva/sclera: Conjunctivae normal.  Neck:     Musculoskeletal: Neck supple.  Abdominal:     Palpations: Abdomen is soft.  Genitourinary:    General: Normal vulva.     Exam position: Lithotomy position.     Labia:        Right: No rash.        Left: No rash.      Vagina: Vaginal discharge (thick white with sl malodor, >4.5 ph) present.     Cervix: Normal.     Uterus: Normal.      Adnexa: Right adnexa normal and left adnexa normal.     Rectum: Normal.  Lymphadenopathy:     Lower Body: No right inguinal adenopathy. No left inguinal adenopathy.  Skin:    General: Skin is warm and dry.  Neurological:     Mental Status: She is  alert and oriented to person, place, and time.  Psychiatric:        Mood and Affect: Mood normal.        Behavior: Behavior normal.       Assessment and Plan:  VANETA HAMMONTREE is a 38 y.o. female presenting to the [facility] Department for STI screening  1. Screening examination for venereal disease  - WET PREP FOR TRICH, YEAST, CLUE--+BV Treat for BV with Metrogel due to pt preference Declines birth control due to desires conception Counsel to continue vits daily Pt desires LCSW evaluation--# given to pt  2. Routine screening for STI (sexually transmitted infection) Await GC/Chlamydia results       No follow-ups on file.  No future appointments.  Herbie Saxon, CNM

## 2019-05-18 NOTE — Progress Notes (Signed)
In for STD screening Safiatou Islam, RN  

## 2019-05-22 LAB — GONOCOCCUS CULTURE

## 2019-06-12 ENCOUNTER — Ambulatory Visit: Payer: Self-pay

## 2019-06-14 ENCOUNTER — Encounter: Payer: Self-pay | Admitting: Physician Assistant

## 2019-06-14 ENCOUNTER — Ambulatory Visit: Payer: Self-pay | Admitting: Physician Assistant

## 2019-06-14 ENCOUNTER — Other Ambulatory Visit: Payer: Self-pay

## 2019-06-14 DIAGNOSIS — Z09 Encounter for follow-up examination after completed treatment for conditions other than malignant neoplasm: Secondary | ICD-10-CM

## 2019-06-14 DIAGNOSIS — Z3009 Encounter for other general counseling and advice on contraception: Secondary | ICD-10-CM

## 2019-06-14 DIAGNOSIS — B9689 Other specified bacterial agents as the cause of diseases classified elsewhere: Secondary | ICD-10-CM

## 2019-06-14 DIAGNOSIS — N760 Acute vaginitis: Secondary | ICD-10-CM

## 2019-06-14 MED ORDER — MULTI-VITAMIN/MINERALS PO TABS: 1.0000 | ORAL_TABLET | Freq: Every day | ORAL | 0 refills | Status: DC

## 2019-06-14 MED ORDER — METRONIDAZOLE 0.75 % VA GEL: 1.0000 | Freq: Every day | VAGINAL | 0 refills | Status: DC

## 2019-06-14 NOTE — Progress Notes (Signed)
S:  Patient into clinic today requesting re-treatment for BV today.  Reports that she was here about 2-3 weeks ago and treated for BV but her niece got the tube of gel and squirted it out in the bathroom, so she only used if for 3 nights.  States that she is still having symptoms and denies sexual activity since before her last visit.  Declines re-screening today and only wants med to treat BV.  Also, requests MVI. O:  WDWN female in NAD, A&O x 3. A/P:  1.  Patient requests medicine to treat BV due to medicine destroyed by family member. 2.  Vandazole 0.75% gel given to patient to use 1 app qhs for 5-7 days 3.  No sex for 7 days 4.  Use OTC antifungal cream if has itching during or just after use of antibiotic. 5.  MVI 1 po QD 1 bottle given to patient per her request.

## 2019-06-15 NOTE — Addendum Note (Signed)
Addended by: Donnal Moat on: 06/15/2019 09:23 AM   Modules accepted: Orders

## 2019-07-05 ENCOUNTER — Ambulatory Visit (LOCAL_COMMUNITY_HEALTH_CENTER): Payer: Self-pay | Admitting: Advanced Practice Midwife

## 2019-07-05 ENCOUNTER — Other Ambulatory Visit: Payer: Self-pay

## 2019-07-05 VITALS — BP 121/74 | Ht 66.0 in | Wt 191.6 lb

## 2019-07-05 DIAGNOSIS — Z3009 Encounter for other general counseling and advice on contraception: Secondary | ICD-10-CM

## 2019-07-05 DIAGNOSIS — E669 Obesity, unspecified: Secondary | ICD-10-CM

## 2019-07-05 DIAGNOSIS — R102 Pelvic and perineal pain: Secondary | ICD-10-CM

## 2019-07-05 DIAGNOSIS — Z9889 Other specified postprocedural states: Secondary | ICD-10-CM | POA: Insufficient documentation

## 2019-07-05 LAB — WET PREP FOR TRICH, YEAST, CLUE
Trichomonas Exam: NEGATIVE
Yeast Exam: NEGATIVE

## 2019-07-05 NOTE — Progress Notes (Addendum)
Here today to discuss "cervical cramping and burning sensation." Requesting a referral to a pain clinic due to above symptoms. Also requesting for assessment of "pain in her right breast because of scar tissue." Declines birth control due to desires pregnancy. Hal Morales, RN Wet Prep results reviewed. Per standing orders no treatment indicated. [facility] Ob[facility] contact number given per provider orders. Also instructed patient to follow up with county of residence Armed forces technical officer) for [facility] application. Hal Morales, RN

## 2019-07-05 NOTE — Progress Notes (Signed)
   [place] problem visit  [place] Department  Subjective:  Rachel Stevens is a 38 y.o. G2P0 nonsmoker being seen today for "cramping and burning constantly in my cervix x 3 years" and wants referral to pain clinic for this as well as for pain and increased sensitivity in right breast nipple due to "scar tissue" because of 2009 breast reduction surgery.  Chief Complaint  Patient presents with  . Gynecologic Exam    "Cervix cramping"    HPI  Last pap 03/08/17 neg.  Breast reduction surgery 2009. Does the patient have a current or past history of drug use? No   No components found for: HCV]   Health Maintenance Due  Topic Date Due  . TETANUS/TDAP  02/13/2000  . PAP SMEAR-Modifier  03/08/2018  . INFLUENZA VACCINE  04/29/2019    ROS  The following portions of the patient's history were reviewed and updated as appropriate: allergies, current medications, past family history, past medical history, past social history, past surgical history and problem list. Problem list updated.   See flowsheet for other program required questions.  Objective:   Vitals:   07/05/19 1419  BP: 121/74  Weight: 191 lb 9.6 oz (86.9 kg)  Height: 5\' 6"  (1.676 m)    Physical Exam Constitutional:      Appearance: Normal appearance. She is obese.  HENT:     Head: Normocephalic.  Eyes:     Conjunctiva/sclera: Conjunctivae normal.  Pulmonary:     Effort: Pulmonary effort is normal.     Breath sounds: Normal breath sounds.  Chest:     Comments: Pt with surgical scar to right areola with breast reductions surgery 2009 and states nipple is hypersensitive now Abdominal:     Palpations: Abdomen is soft.     Comments: Abdomen soft without tenderness  Genitourinary:    General: Normal vulva.     Exam position: Lithotomy position.     Labia:        Right: No tenderness or lesion.        Left: No tenderness or lesion.      Vagina: Tenderness (pt c/o sl tenderness in  right and left vaginal wall and anterior to cx palpating up to bladder/uterus, -CMT, -uterine pain) present.     Cervix: Normal.     Uterus: Normal.      Adnexa: Right adnexa normal and left adnexa normal.     Rectum: Normal.     Comments: Pt states burning and cramping is on right and left vaginal sidewalls on deep palpation, as well as anterior to uterus. -CMT or uterine pain on palpation. Skin:    General: Skin is warm and dry.  Neurological:     Mental Status: She is alert.       Assessment and Plan:  Rachel Stevens is a 38 y.o. female presenting to the [facility] Department for a Women's Health problem visit  1. Obesity, unspecified classification, unspecified obesity type, unspecified whether serious comorbidity present BMI=30.9  2. Pelvic "burning" x3 years STD testing done today.  +HSV hx Pt to call for GYN doctor at [facility] (has no primary care MD, Medicaid, or insurance) - [place], [place], [place]  3. Family planning Pap due 2021.  Doesn't want birth control - Chlamydia/Gonorrhea [place] [facility]     Return if symptoms worsen or fail to improve.  No future appointments.  Rachel Stevens, CNM

## 2019-07-25 ENCOUNTER — Encounter: Payer: Self-pay | Admitting: Advanced Practice Midwife

## 2019-10-20 ENCOUNTER — Other Ambulatory Visit: Payer: Self-pay

## 2019-10-20 ENCOUNTER — Ambulatory Visit: Payer: Self-pay | Admitting: Physician Assistant

## 2019-10-20 DIAGNOSIS — B9689 Other specified bacterial agents as the cause of diseases classified elsewhere: Secondary | ICD-10-CM

## 2019-10-20 DIAGNOSIS — N760 Acute vaginitis: Secondary | ICD-10-CM

## 2019-10-20 DIAGNOSIS — Z113 Encounter for screening for infections with a predominantly sexual mode of transmission: Secondary | ICD-10-CM

## 2019-10-20 LAB — WET PREP FOR TRICH, YEAST, CLUE
Trichomonas Exam: NEGATIVE
Yeast Exam: NEGATIVE

## 2019-10-20 MED ORDER — METRONIDAZOLE 0.75 % VA GEL: 1.0000 | Freq: Every day | VAGINAL | 0 refills | Status: AC

## 2019-10-20 NOTE — Progress Notes (Signed)
Pt here for STD screening. Pt reports she thinks she may have BV. Pt reports she started her period 10/19/2019 but it has been lighter than normal.Dillie Burandt Tama Headings, RN

## 2019-10-20 NOTE — Progress Notes (Signed)
Wet mount reviewed and pt received treatment for BV per Sadie Haber, PA order with Metro gel. Provider orders completed.Lyman Speller, RN

## 2019-10-21 NOTE — Progress Notes (Signed)
[facility] Department STI clinic/screening visit  Subjective:  Rachel Stevens is a 39 y.o. female being seen today for an STI screening visit. The patient reports they do have symptoms.  Patient reports that they do not desire a pregnancy in the next year.   They reported they are not interested in discussing contraception today.  Patient's last menstrual period was 10/19/2019 (exact date).   Patient has the following medical conditions:   Patient Active Problem List   Diagnosis Date Noted  . Obesity BMI=30.9 07/05/2019  . Pelvic "burning" x3 years 07/05/2019  . History of reduction surgery of right breast 2009 07/05/2019  . Depression 07/05/2015  . Irregular periods 01/31/2015  . 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 SEXUALLY TRANSMITTED DISEASE    STD screening    HPI  Patient reports that she has had a vaginal odor for about 1 week.  States that her period started yesterday and at first it was lighter than usual.    See flowsheet for further details and programmatic requirements.    The following portions of the patient's history were reviewed and updated as appropriate: allergies, current medications, past medical history, past social history, past surgical history and problem list.  Objective:  There were no vitals filed for this visit.  Physical Exam Constitutional:      General: She is not in acute distress.    Appearance: She is normal weight.  HENT:     Head: Normocephalic and atraumatic.     Comments: No nits, lice, or hair loss. No cervical, supraclavicular or axillary adenopathy.    Mouth/Throat:     Mouth: Mucous membranes are moist.     Pharynx: Oropharynx is clear. No oropharyngeal exudate or posterior oropharyngeal erythema.  Eyes:     Conjunctiva/sclera: Conjunctivae normal.  Pulmonary:     Effort: Pulmonary effort is normal.  Abdominal:     Palpations: Abdomen is soft. There  is no mass.     Tenderness: There is no abdominal tenderness. There is no guarding or rebound.  Genitourinary:    General: Normal vulva.     Rectum: Normal.     Comments: External genitalia/pubic area without nits, lice, edema, erythema, lesions and inguinal adenopathy. Vagina with normal mucosa and moderate amount of menstrual bleeding. Cervix without visible lesions. Uterus firm, mobile, nt,no masses, no CMT, no adnexal tenderness or fullness. Musculoskeletal:     Cervical back: Neck supple. No tenderness.  Skin:    General: Skin is warm and dry.     Findings: No bruising, erythema, lesion or rash.  Neurological:     Mental Status: She is alert and oriented to person, place, and time.  Psychiatric:        Mood and Affect: Mood normal.        Behavior: Behavior normal.        Thought Content: Thought content normal.        Judgment: Judgment normal.      Assessment and Plan:  Rachel Stevens is a 39 y.o. female presenting to the [facility] Department for STI screening  1. Screening for STD (sexually transmitted disease) Patient into clinic with symptoms. Rec condoms with all sex. Await test results.  Counseled that RN will call if needs to RTC for further treatment once results are back. - WET PREP FOR TRICH, YEAST, CLUE - Gonococcus culture - [place]  [facility] - Syphilis Serology, [facility]  2. BV (bacterial vaginosis) Will treat for BV with Metro gel 1 app qhs for 5 days per patient request. No sex for 7 days. Enc to use OTC antifungal cream if has itching after use of antibiotic gel. - metroNIDAZOLE (METROGEL VAGINAL) 0.75 % vaginal gel; Place 1 Applicatorful vaginally at bedtime for 5 days.  Dispense: 70 g; Refill: 0     Return if symptoms worsen or fail to improve.  Future Appointments  Date Time Provider Department Center  11/07/2019  2:30 PM Adline Potter, NP [facility]    Matt Holmes, [place]

## 2019-10-25 LAB — GONOCOCCUS CULTURE

## 2019-10-31 ENCOUNTER — Encounter: Payer: Self-pay | Admitting: Physician Assistant

## 2019-11-07 ENCOUNTER — Encounter: Payer: Self-pay | Admitting: Adult Health

## 2019-11-23 ENCOUNTER — Encounter: Payer: Self-pay | Admitting: Physician Assistant

## 2019-11-23 ENCOUNTER — Other Ambulatory Visit: Payer: Self-pay | Admitting: Physician Assistant

## 2019-11-23 DIAGNOSIS — B9689 Other specified bacterial agents as the cause of diseases classified elsewhere: Secondary | ICD-10-CM

## 2019-11-23 DIAGNOSIS — Z09 Encounter for follow-up examination after completed treatment for conditions other than malignant neoplasm: Secondary | ICD-10-CM

## 2019-11-23 NOTE — Telephone Encounter (Signed)
Reviewed patient request and agree with RN note/message that patient have an appointment.  No refill sent to pharmacy.

## 2019-11-24 ENCOUNTER — Ambulatory Visit: Payer: Self-pay

## 2019-12-07 ENCOUNTER — Ambulatory Visit: Payer: Self-pay

## 2019-12-08 ENCOUNTER — Other Ambulatory Visit: Payer: Self-pay

## 2019-12-08 ENCOUNTER — Ambulatory Visit: Payer: Self-pay | Admitting: Family Medicine

## 2019-12-08 ENCOUNTER — Encounter: Payer: Self-pay | Admitting: Family Medicine

## 2019-12-08 DIAGNOSIS — B9689 Other specified bacterial agents as the cause of diseases classified elsewhere: Secondary | ICD-10-CM

## 2019-12-08 DIAGNOSIS — N760 Acute vaginitis: Secondary | ICD-10-CM

## 2019-12-08 DIAGNOSIS — Z113 Encounter for screening for infections with a predominantly sexual mode of transmission: Secondary | ICD-10-CM

## 2019-12-08 DIAGNOSIS — B009 Herpesviral infection, unspecified: Secondary | ICD-10-CM

## 2019-12-08 LAB — WET PREP FOR TRICH, YEAST, CLUE
Trichomonas Exam: NEGATIVE
Yeast Exam: NEGATIVE

## 2019-12-08 MED ORDER — METRONIDAZOLE 0.75 % VA GEL: VAGINAL | 7 refills | Status: DC

## 2019-12-08 MED ORDER — METRONIDAZOLE 0.75 % VA GEL: 1.0000 | Freq: Two times a day (BID) | VAGINAL | 0 refills | Status: DC

## 2019-12-08 MED ORDER — FAMCICLOVIR 500 MG PO TABS: ORAL_TABLET | ORAL | 1 refills | Status: DC

## 2019-12-08 NOTE — Progress Notes (Signed)
Wet Mount results reviewed. Patient treated for BV per provider orders. Breonna Gafford, RN  

## 2019-12-08 NOTE — Progress Notes (Signed)
[facility] Department STI clinic/screening visit  Subjective:  Rachel Stevens is a 39 y.o. female being seen today for  Chief Complaint  Patient presents with  . SEXUALLY TRANSMITTED DISEASE     The patient reports they do have symptoms. Patient reports that they do not desire a pregnancy in the next year. They reported they are not interested in discussing contraception today.   Patient has the following medical conditions:   Patient Active Problem List   Diagnosis Date Noted  . Obesity BMI=30.9 07/05/2019  . Pelvic "burning" x3 years 07/05/2019  . History of reduction surgery of right breast 2009 07/05/2019  . Depression 07/05/2015  . Irregular periods 01/31/2015  . 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 States she has had BV 3-4 times last year.  Also endorses a burning sensation on labia. Pt has HSV, takes valcyclovir with outbreaks but requests switch to famciclovir. Has had 2 lesions on inner L thigh in past few months.   See flowsheet for further details and programmatic requirements.    Patient's last menstrual period was 11/14/2019 (exact date). Last sex: 1 mo ago BCM: condoms Desires EC? n/a   No components found for: HCV  The following portions of the patient's history were reviewed and updated as appropriate: allergies, current medications, past medical history, past social history, past surgical history and problem list.  Objective:  There were no vitals filed for this visit.   Physical Exam Vitals and nursing note reviewed.  Constitutional:      Appearance: Normal appearance.  HENT:     Head: Normocephalic and atraumatic.     Mouth/Throat:     Mouth: Mucous membranes are moist.     Pharynx: Oropharynx is clear. No oropharyngeal exudate or posterior oropharyngeal erythema.  Pulmonary:     Effort: Pulmonary effort is normal.  Abdominal:     General:  Abdomen is flat.     Palpations: There is no mass.     Tenderness: There is no abdominal tenderness. There is no rebound.  Genitourinary:    General: Normal vulva.     Exam position: Lithotomy position.     Pubic Area: No rash or pubic lice.      Labia:        Right: No rash or lesion.        Left: No rash or lesion.      Vagina: Vaginal discharge (white, adherent to walls, ph>4.5) present. No erythema, bleeding or lesions.     Cervix: No cervical motion tenderness, discharge, friability, lesion or erythema.     Uterus: Normal.      Adnexa: Right adnexa normal and left adnexa normal.     Rectum: Normal.    Lymphadenopathy:     Head:     Right side of head: No preauricular or posterior auricular adenopathy.     Left side of head: No preauricular or posterior auricular adenopathy.     Cervical: No cervical adenopathy.     Upper Body:     Right upper body: No supraclavicular or axillary adenopathy.     Left upper body: No supraclavicular or axillary adenopathy.     Lower Body: No right inguinal adenopathy. No left inguinal adenopathy.  Skin:    General: Skin is warm and dry.     Findings: No rash.  Neurological:     Mental Status: She is alert  and oriented to person, place, and time.      Assessment and Plan:  Rachel Stevens is a 39 y.o. female presenting to the [facility] Department for STI screening   1. Screening examination for venereal disease -Screenings today as below. Treat wet prep per standing order. -Patient does not meet criteria for HepB, HepC Screening. Declines HIV and syphilis screenings, had these 2 mo ago.  -Counseled on warning s/sx and when to seek care. Recommended condom use with all sex and discussed importance of condom use for STI prevention. - WET PREP FOR TRICH, YEAST, CLUE - Chlamydia/Gonorrhea [place] [facility]  2. HSV-2 (herpes simplex virus 2) infection -Pt w/initial feelings of new outbreak and what appears to be healing prior outbreak  on exam. Pt requests symptomatic tx prescription of famciclovir, prescribed as below. - famciclovir (FAMVIR) 500 MG tablet; Take 2 tablets (1,000 mg) twice per day for 1 day. Start ASAP w/in 6hr of symptom onset.  Dispense: 20 tablet; Refill: 1  3. BV -Exam suggestive of BV. As pt has had 3-4 reccurances in the past year they would like to try extended treatment. I advised metronidazole tablets concurrently w/boric acid but she cannot tolerate the tablets, prefers to try metrogel and then start boric acid after completion.  -The following instructions were discussed, printed and given to pt:  1. Begin taking the metronidazole 500mg  TABLETS twice daily for 7 days (these were provided in clinic).             *Avoid alcohol while taking this medication and for 3 days after completion. 2. The same day you begin the metronidazole tablets begin using boric acid 600mg  vaginal suppositories daily at bedtime (these can be purchased online).            *Avoid intercourse while using boric acid suppositories as they may cause irritation to partners.           *Boric acid suppositories may cause death if taken by mouth. Keep away from children. 3. Once you complete the metronidazole tablets continue using the boric acid vaginal suppositories for 14 more days (21 days in total). 4. Once you complete the boric acid vaginal suppositories begin using the metronidazole 0.75% GEL twice weekly for 4-6 months (this was prescribed to your pharmacy).            *Alcohol and intercourse are fine while using the metronidazole gel. -Discussed probiotics that contain L rhamnosus GR and Lactobacillus reuteri RC-14 may or may not help prevent recurrences (some studies show prevention, other studies do not).  -Other preventative measures including hygiene and lifestyle modifications (all-cotton underwear, avoid tight clothing, no douching, no smoking, etc) discussed. - metroNIDAZOLE (METROGEL VAGINAL) 0.75 % vaginal gel; Place 1  Applicatorful vaginally 2 (two) times daily.  Dispense: 70 g; Refill: 0 - metroNIDAZOLE (METROGEL VAGINAL) 0.75 % vaginal gel; Use 1 applicatorful per vagina at bedtime twice weekly for 4-6 months.  Dispense: 70 g; Refill: 7   Return if symptoms worsen or fail to improve.  No future appointments.  , PA-C

## 2019-12-08 NOTE — Progress Notes (Signed)
Here today for STD screening. "Thinks she has BV." Requests "the gel for BV." Declines bloodwork.  Tawny Hopping, RN

## 2019-12-19 ENCOUNTER — Encounter (HOSPITAL_COMMUNITY): Payer: Self-pay | Admitting: Emergency Medicine

## 2019-12-19 ENCOUNTER — Emergency Department (HOSPITAL_COMMUNITY)
Admission: EM | Admit: 2019-12-19 | Discharge: 2019-12-19 | Disposition: A | Discharge status: Home / Self Care | Payer: Self-pay | Attending: Emergency Medicine | Admitting: Emergency Medicine

## 2019-12-19 ENCOUNTER — Other Ambulatory Visit: Payer: Self-pay

## 2019-12-19 DIAGNOSIS — T565X1A Toxic effect of zinc and its compounds, accidental (unintentional), initial encounter: Secondary | ICD-10-CM

## 2019-12-19 DIAGNOSIS — R519 Headache, unspecified: Secondary | ICD-10-CM | POA: Insufficient documentation

## 2019-12-19 DIAGNOSIS — R112 Nausea with vomiting, unspecified: Secondary | ICD-10-CM | POA: Insufficient documentation

## 2019-12-19 DIAGNOSIS — R1084 Generalized abdominal pain: Secondary | ICD-10-CM | POA: Insufficient documentation

## 2019-12-19 DIAGNOSIS — T493X1A Poisoning by emollients, demulcents and protectants, accidental (unintentional), initial encounter: Secondary | ICD-10-CM | POA: Insufficient documentation

## 2019-12-19 MED ORDER — ONDANSETRON HCL 4 MG PO TABS: 4.0000 mg | ORAL_TABLET | Freq: Four times a day (QID) | ORAL | 0 refills | Status: DC | PRN

## 2019-12-19 MED ORDER — DIPHENOXYLATE-ATROPINE 2.5-0.025 MG PO TABS: 1.0000 | ORAL_TABLET | Freq: Four times a day (QID) | ORAL | 0 refills | Status: DC | PRN

## 2019-12-19 MED ORDER — DICYCLOMINE HCL 20 MG PO TABS: 20.0000 mg | ORAL_TABLET | Freq: Three times a day (TID) | ORAL | 0 refills | Status: DC | PRN

## 2019-12-19 MED ORDER — LOPERAMIDE HCL 2 MG PO CAPS
4.0000 mg | ORAL_CAPSULE | Freq: Once | ORAL | Status: AC
  Administered 2019-12-19: 4 mg via ORAL
  Filled 2019-12-19: qty 2

## 2019-12-19 MED ORDER — DICYCLOMINE HCL 10 MG PO CAPS
10.0000 mg | ORAL_CAPSULE | Freq: Once | ORAL | Status: AC
  Administered 2019-12-19: 10 mg via ORAL
  Filled 2019-12-19: qty 1

## 2019-12-19 MED ORDER — ONDANSETRON 8 MG PO TBDP
8.0000 mg | ORAL_TABLET | Freq: Once | ORAL | Status: AC
  Administered 2019-12-19: 8 mg via ORAL
  Filled 2019-12-19: qty 1

## 2019-12-19 NOTE — ED Triage Notes (Signed)
Pt states she took 6 50 mg tablets of zinc thinking they were 5 mg tablets. Pt c/o abd pain and states her eyes feel funny.

## 2019-12-19 NOTE — ED Provider Notes (Signed)
[facility] EMERGENCY DEPARTMENT Provider Note   CSN: [id number] Arrival date & time: 12/19/19  0037     History Chief Complaint  Patient presents with  . Drug Overdose    Rachel Stevens is a 39 y.o. female.  Patient presents to the ER with concerns over accidental overdose of zinc.  Patient reports that she had been doing some research and saw online that did not help your immune system.  She bought some zinc 50 mg tablets, but thought that they were 5 mg tablets.  She was trying to take 6 tablets which would have been a therapeutic dose, but then realized that there were 50 mg tablets.  She has had some mild headache, nausea, vomiting and stomach cramping since.        Past Medical History:  Diagnosis Date  . Chronic pelvic pain in female   . Herpes   . 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 Irregular periods 01/31/2015  . Missed period 11/01/2014  . Patient desires pregnancy 11/01/2014  . Pelvic floor tension   . Vaginal discharge 11/01/2014  . Vulvar pain   . Weight gain 11/01/2014    Patient Active Problem List   Diagnosis Date Noted  . Obesity BMI=30.9 07/05/2019  . Pelvic "burning" x3 years 07/05/2019  . History of reduction surgery of right breast 2009 07/05/2019  . Depression 07/05/2015  . Irregular periods 01/31/2015  . 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 REDUCTION MAMMAPLASTY Right      OB History    Gravida  0   Para      Term      Preterm      AB      Living        SAB      TAB      Ectopic      Multiple      Live Births              Family History  Problem Relation Age of Onset  . Hypertension Mother   . Heart disease Mother   . Cancer Father        throat  . Pancreatic cancer Father   . Congestive Heart Failure Brother   . Heart disease Maternal Grandmother   . Hypertension Maternal Grandmother   . Cancer Maternal Grandfather   .  Hypertension Maternal Grandfather   . Cancer Paternal Grandmother        breast    Social History   Tobacco Use  . Smoking status: Never Smoker  . Smokeless tobacco: Never Used  Substance Use Topics  . Alcohol use: No  . Drug use: No    Home Medications Prior to Admission medications   Medication Sig Start Date End Date Taking? Authorizing Provider  dicyclomine (BENTYL) 20 MG tablet Take 1 tablet (20 mg total) by mouth 3 (three) times daily as needed (abd pain). 12/19/19   Gilda Crease, MD  diphenoxylate-atropine (LOMOTIL) 2.5-0.025 MG tablet Take 1-2 tablets by mouth 4 (four) times daily as needed for diarrhea or loose stools. 12/19/19   Gilda Crease, MD  famciclovir (FAMVIR) 500 MG tablet Take 2 tablets (1,000 mg) twice per day for 1 day. Start ASAP w/in 6hr of symptom onset. 12/08/19   Staples, Eileen Stanford L, PA-C  metroNIDAZOLE (METROGEL VAGINAL) 0.75 % vaginal gel Place 1 Applicatorful vaginally 2 (two) times daily. 12/08/19  Staples, Eileen Stanford L, PA-C  metroNIDAZOLE (METROGEL VAGINAL) 0.75 % vaginal gel Use 1 applicatorful per vagina at bedtime twice weekly for 4-6 months. 12/08/19   Staples, Hulda Humphrey, PA-C  Multiple Vitamins-Minerals (MULTIVITAMIN WITH MINERALS) tablet Take 1 tablet by mouth daily. 06/14/19   Matt Holmes, PA  ondansetron (ZOFRAN) 4 MG tablet Take 1 tablet (4 mg total) by mouth every 6 (six) hours as needed for nausea or vomiting. 12/19/19   Karrington Studnicka, Canary Brim, MD  pantoprazole (PROTONIX) 20 MG tablet Take 1 tablet (20 mg total) by mouth daily for 30 days. 11/07/18 12/07/18  Samuel Jester, DO    Allergies    Bee venom, Latex, and Penicillins  Review of Systems   Review of Systems  Gastrointestinal: Positive for nausea and vomiting.  Neurological: Positive for headaches.  All other systems reviewed and are negative.   Physical Exam Updated Vital Signs BP 132/90 (BP Location: Right Arm)   Pulse 67   Temp 98.7 F (37.1 C) (Oral)   Resp 17    Ht 5\' 6"  (1.676 m)   Wt 86.6 kg   LMP 12/13/2019   SpO2 100%   BMI 30.83 kg/m   Physical Exam Vitals and nursing note reviewed.  Constitutional:      General: She is not in acute distress.    Appearance: Normal appearance. She is well-developed.  HENT:     Head: Normocephalic and atraumatic.     Right Ear: Hearing normal.     Left Ear: Hearing normal.     Nose: Nose normal.  Eyes:     Conjunctiva/sclera: Conjunctivae normal.     Pupils: Pupils are equal, round, and reactive to light.  Cardiovascular:     Rate and Rhythm: Regular rhythm.     Heart sounds: S1 normal and S2 normal. No murmur. No friction rub. No gallop.   Pulmonary:     Effort: Pulmonary effort is normal. No respiratory distress.     Breath sounds: Normal breath sounds.  Chest:     Chest wall: No tenderness.  Abdominal:     General: Bowel sounds are normal.     Palpations: Abdomen is soft.     Tenderness: There is no abdominal tenderness. There is no guarding or rebound. Negative signs include Murphy's sign and McBurney's sign.     Hernia: No hernia is present.  Musculoskeletal:        General: Normal range of motion.     Cervical back: Normal range of motion and neck supple.  Skin:    General: Skin is warm and dry.     Findings: No rash.  Neurological:     Mental Status: She is alert and oriented to person, place, and time.     GCS: GCS eye subscore is 4. GCS verbal subscore is 5. GCS motor subscore is 6.     Cranial Nerves: No cranial nerve deficit.     Sensory: No sensory deficit.     Coordination: Coordination normal.  Psychiatric:        Speech: Speech normal.        Behavior: Behavior normal.        Thought Content: Thought content normal.     ED Results / Procedures / Treatments   Labs (all labs ordered are listed, but only abnormal results are displayed) Labs Reviewed - No data to display  EKG None  Radiology No results found.  Procedures Procedures (including critical care  time)  Medications Ordered in ED Medications  loperamide (IMODIUM) capsule 4 mg (has no administration in time range)  ondansetron (ZOFRAN-ODT) disintegrating tablet 8 mg (has no administration in time range)  dicyclomine (BENTYL) capsule 10 mg (has no administration in time range)    ED Course  I have reviewed the triage vital signs and the nursing notes.  Pertinent labs & imaging results that were available during my care of the patient were reviewed by me and considered in my medical decision making (see chart for details).    MDM Rules/Calculators/A&P                      Patient appears well.  She accidentally overdosed on zinc.  This was a single dose earlier today.  Patient experiencing GI upset which is expected.  She appears well.  Abdominal exam is unremarkable.  Gust with poison control, no lab work or specific work-up or treatment other than symptomatic treatment necessary.  Patient reassured, will treat symptomatically.  Final Clinical Impression(s) / ED Diagnoses Final diagnoses:  Zinc salts overdose, accidental or unintentional, initial encounter    Rx / DC Orders ED Discharge Orders         Ordered    ondansetron (ZOFRAN) 4 MG tablet  Every 6 hours PRN     12/19/19 0211    dicyclomine (BENTYL) 20 MG tablet  3 times daily PRN     12/19/19 0211    diphenoxylate-atropine (LOMOTIL) 2.5-0.025 MG tablet  4 times daily PRN     12/19/19 0211           Orpah Greek, MD 12/19/19 [phone]

## 2019-12-19 NOTE — Discharge Instructions (Signed)
Medications as needed for your GI symptoms.  You have any significant worsening, return for repeat evaluation.

## 2019-12-24 ENCOUNTER — Emergency Department (HOSPITAL_COMMUNITY): Payer: Self-pay

## 2019-12-24 ENCOUNTER — Encounter (HOSPITAL_COMMUNITY): Payer: Self-pay | Admitting: *Deleted

## 2019-12-24 ENCOUNTER — Emergency Department (HOSPITAL_COMMUNITY)
Admission: EM | Admit: 2019-12-24 | Discharge: 2019-12-24 | Disposition: A | Discharge status: Home / Self Care | Payer: Self-pay | Attending: Emergency Medicine | Admitting: Emergency Medicine

## 2019-12-24 ENCOUNTER — Other Ambulatory Visit: Payer: Self-pay

## 2019-12-24 DIAGNOSIS — R109 Unspecified abdominal pain: Secondary | ICD-10-CM | POA: Insufficient documentation

## 2019-12-24 DIAGNOSIS — Z79899 Other long term (current) drug therapy: Secondary | ICD-10-CM | POA: Insufficient documentation

## 2019-12-24 LAB — CBC WITH DIFFERENTIAL/PLATELET
Abs Immature Granulocytes: 0.03 10*3/uL (ref 0.00–0.07)
Basophils Absolute: 0 10*3/uL (ref 0.0–0.1)
Basophils Relative: 0 %
Eosinophils Absolute: 0 10*3/uL (ref 0.0–0.5)
Eosinophils Relative: 1 %
HCT: 36.3 % (ref 36.0–46.0)
Hemoglobin: 11.6 g/dL — ABNORMAL LOW (ref 12.0–15.0)
Immature Granulocytes: 0 %
Lymphocytes Relative: 27 %
Lymphs Abs: 2.4 10*3/uL (ref 0.7–4.0)
MCH: 28.6 pg (ref 26.0–34.0)
MCHC: 32 g/dL (ref 30.0–36.0)
MCV: 89.4 fL (ref 80.0–100.0)
Monocytes Absolute: 0.6 10*3/uL (ref 0.1–1.0)
Monocytes Relative: 7 %
Neutro Abs: 5.8 10*3/uL (ref 1.7–7.7)
Neutrophils Relative %: 65 %
Platelets: 254 10*3/uL (ref 150–400)
RBC: 4.06 MIL/uL (ref 3.87–5.11)
RDW: 13 % (ref 11.5–15.5)
WBC: 8.8 10*3/uL (ref 4.0–10.5)
nRBC: 0 % (ref 0.0–0.2)

## 2019-12-24 LAB — COMPREHENSIVE METABOLIC PANEL
ALT: 18 U/L (ref 0–44)
AST: 23 U/L (ref 15–41)
Albumin: 3.9 g/dL (ref 3.5–5.0)
Alkaline Phosphatase: 69 U/L (ref 38–126)
Anion gap: 7 (ref 5–15)
BUN: 10 mg/dL (ref 6–20)
CO2: 27 mmol/L (ref 22–32)
Calcium: 8.8 mg/dL — ABNORMAL LOW (ref 8.9–10.3)
Chloride: 104 mmol/L (ref 98–111)
Creatinine, Ser: 0.76 mg/dL (ref 0.44–1.00)
GFR calc Af Amer: >60 mL/min (ref >60)
GFR calc non Af Amer: >60 mL/min (ref >60)
Glucose, Bld: 93 mg/dL (ref 70–99)
Potassium: 3.7 mmol/L (ref 3.5–5.1)
Sodium: 138 mmol/L (ref 135–145)
Total Bilirubin: 0.4 mg/dL (ref 0.3–1.2)
Total Protein: 7.5 g/dL (ref 6.5–8.1)

## 2019-12-24 LAB — URINALYSIS, ROUTINE W REFLEX MICROSCOPIC
Bilirubin Urine: NEGATIVE
Glucose, UA: NEGATIVE mg/dL
Hgb urine dipstick: NEGATIVE
Ketones, ur: NEGATIVE mg/dL
Leukocytes,Ua: NEGATIVE
Nitrite: NEGATIVE
Protein, ur: NEGATIVE mg/dL
Specific Gravity, Urine: 1.013 (ref 1.005–1.030)
pH: 7 (ref 5.0–8.0)

## 2019-12-24 LAB — LIPASE, BLOOD: Lipase: 19 U/L (ref 11–51)

## 2019-12-24 LAB — PREGNANCY, URINE: Preg Test, Ur: NEGATIVE

## 2019-12-24 NOTE — Discharge Instructions (Addendum)
You were seen in the ER for right flank pain and generalized weakness. Your lab work did not show any signs of dehydration,infection, significant anemia, etc.  You have no fever, your scan of your kidneys was negative for any stones.  Urine test was negative for UTI.  Continue to drink plenty of fluids, cut back on sodas. I will provide resources for local primary care providers, I strongly encourage that you establish care with one of them so you can continue to follow with them. Return to the ER if your symptoms worsen.

## 2019-12-24 NOTE — ED Notes (Signed)
To CT via stretcher   Pt reports a "broken blister" that she would like the PA to evaluate as well as her flank pain   PA is informed

## 2019-12-24 NOTE — ED Provider Notes (Signed)
[facility] EMERGENCY DEPARTMENT Provider Note   CSN: [id number] Arrival date & time: 12/24/19  1207     History Chief Complaint  Patient presents with  . Flank Pain    right    Rachel Stevens is a 39 y.o. female.  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  After overdosing on zinc on Monday, she had abdominal pain and diarrhea and was given Zofran, Bentyl, Lomotil for symptomatic management.  She did no improvement of her symptoms over the next few days.  However on Thursday, she began noting generalized fatigue, occasional dizziness, has had one episode of nonbloody nonbilious vomiting this morning.  She noticed an increase in right-sided back pain, no hematuria but she did note a strange odor in her urine over the last few days.  She had some mid umbilical pain that radiates to her back yesterday, but states today that her pain is mostly in her back.  She has a history of HSV-2, has tried multiple antivirals and states that they all make her feel ill.  She is not on no antiviral at this time.  Pending appointment with an infectious disease doctor at [facility]  She has not been sexually active in a few months, last wet prep on 12/08/2019 was negative for any infectious pathology and she has not been sexually active since.  She rates her pain a 5 out of 10.  Endorses occasional body aches, but no fevers, abnormal vaginal discharge, vaginal pain, cough, shortness of breath, chest pain.  She does not recall doing any heavy lifting or any activities that could cause a muscular strain.  She also refers that she is worried about kidney stones as she has been drinking lots of sodas and not enough water.   Past Medical History:  Diagnosis Date  . Chronic pelvic pain in female   . Herpes   . 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   . Irregular periods 01/31/2015  . Missed period 11/01/2014  . Patient desires pregnancy 11/01/2014  . Pelvic floor tension   . Vaginal discharge 11/01/2014  . Vulvar pain   . Weight gain 11/01/2014    Patient Active Problem List   Diagnosis Date Noted  . Obesity BMI=30.9 07/05/2019  . Pelvic "burning" x3 years 07/05/2019  . History of reduction surgery of right breast 2009 07/05/2019  . Depression 07/05/2015  . Irregular periods 01/31/2015  . 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 REDUCTION MAMMAPLASTY Right      OB History    Gravida  0   Para      Term      Preterm      AB      Living        SAB      TAB      Ectopic      Multiple      Live Births              Family History  Problem Relation Age of Onset  . Hypertension Mother   . Heart disease Mother   . Cancer Father        throat  . Pancreatic cancer Father   . Congestive Heart Failure Brother   . Heart disease Maternal  Grandmother   . Hypertension Maternal Grandmother   . Cancer Maternal Grandfather   . Hypertension Maternal Grandfather   . Cancer Paternal Grandmother        breast    Social History   Tobacco Use  . Smoking status: Never Smoker  . Smokeless tobacco: Never Used  Substance Use Topics  . Alcohol use: No  . Drug use: No    Home Medications Prior to Admission medications   Medication Sig Start Date End Date Taking? Authorizing Provider  dicyclomine (BENTYL) 20 MG tablet Take 1 tablet (20 mg total) by mouth 3 (three) times daily as needed (abd pain). Patient not taking: Reported on 12/24/2019 12/19/19   Gilda Crease, MD  diphenoxylate-atropine (LOMOTIL) 2.5-0.025 MG tablet Take 1-2 tablets by mouth 4 (four) times daily as needed for diarrhea or loose stools. Patient not taking: Reported on 12/24/2019 12/19/19   Gilda Crease, MD  famciclovir (FAMVIR) 500 MG tablet Take 2 tablets (1,000 mg)  twice per day for 1 day. Start ASAP w/in 6hr of symptom onset. Patient not taking: Reported on 12/24/2019 12/08/19   Samara Snide L, PA-C  metroNIDAZOLE (METROGEL VAGINAL) 0.75 % vaginal gel Place 1 Applicatorful vaginally 2 (two) times daily. Patient not taking: Reported on 12/24/2019 12/08/19   Samara Snide L, PA-C  metroNIDAZOLE (METROGEL VAGINAL) 0.75 % vaginal gel Use 1 applicatorful per vagina at bedtime twice weekly for 4-6 months. Patient not taking: Reported on 12/24/2019 12/08/19   Samara Snide L, PA-C  Multiple Vitamins-Minerals (MULTIVITAMIN WITH MINERALS) tablet Take 1 tablet by mouth daily. Patient not taking: Reported on 12/24/2019 06/14/19   Matt Holmes, PA  ondansetron (ZOFRAN) 4 MG tablet Take 1 tablet (4 mg total) by mouth every 6 (six) hours as needed for nausea or vomiting. Patient not taking: Reported on 12/24/2019 12/19/19   Gilda Crease, MD  pantoprazole (PROTONIX) 20 MG tablet Take 1 tablet (20 mg total) by mouth daily for 30 days. Patient not taking: Reported on 12/24/2019 11/07/18 12/07/18  Samuel Jester, DO    Allergies    Bee venom, Latex, and Penicillins  Review of Systems   Review of Systems  Constitutional: Positive for fatigue. Negative for appetite change, chills and fever.  HENT: Negative for ear pain, rhinorrhea and sore throat.   Eyes: Negative for pain and visual disturbance.  Respiratory: Negative for cough and shortness of breath.   Cardiovascular: Negative for chest pain and palpitations.  Gastrointestinal: Negative for abdominal pain and vomiting.  Genitourinary: Negative for dysuria, hematuria, pelvic pain, vaginal bleeding, vaginal discharge and vaginal pain.  Musculoskeletal: Positive for back pain. Negative for arthralgias.  Skin: Negative for color change and rash.  Neurological: Positive for dizziness, weakness and light-headedness. Negative for seizures, syncope and numbness.  Psychiatric/Behavioral: Negative for confusion.    All other systems reviewed and are negative.   Physical Exam Updated Vital Signs BP (!) 134/94   Pulse 62   Temp 98.8 F (37.1 C) (Oral)   Ht 5\' 6"  (1.676 m)   Wt 86 kg   LMP 12/13/2019   SpO2 100%   BMI 30.60 kg/m   Physical Exam Vitals reviewed.  Constitutional:      General: She is not in acute distress.    Appearance: She is obese. She is not ill-appearing, toxic-appearing or diaphoretic.  HENT:     Head: Normocephalic and atraumatic.     Mouth/Throat:     Mouth: Mucous membranes are moist.     Pharynx:  Oropharynx is clear. No oropharyngeal exudate or posterior oropharyngeal erythema.  Eyes:     Extraocular Movements: Extraocular movements intact.     Pupils: Pupils are equal, round, and reactive to light.  Cardiovascular:     Rate and Rhythm: Normal rate and regular rhythm.     Pulses: Normal pulses.     Heart sounds: Normal heart sounds.  Pulmonary:     Effort: Pulmonary effort is normal.     Breath sounds: Normal breath sounds.  Abdominal:     General: Abdomen is flat.     Tenderness: There is no abdominal tenderness. There is right CVA tenderness. There is no guarding.  Musculoskeletal:        General: Normal range of motion.     Cervical back: Normal range of motion. No rigidity.  Lymphadenopathy:     Cervical: No cervical adenopathy.  Skin:    General: Skin is warm and dry.     Coloration: Skin is not jaundiced.     Findings: No bruising, erythema or lesion.  Neurological:     General: No focal deficit present.     Mental Status: She is alert and oriented to person, place, and time.     Motor: No weakness.  Psychiatric:        Mood and Affect: Mood normal.        Behavior: Behavior normal.     ED Results / Procedures / Treatments   Labs (all labs ordered are listed, but only abnormal results are displayed) Labs Reviewed  CBC WITH DIFFERENTIAL/PLATELET - Abnormal; Notable for the following components:      Result Value   Hemoglobin 11.6 (*)     All other components within normal limits  COMPREHENSIVE METABOLIC PANEL - Abnormal; Notable for the following components:   Calcium 8.8 (*)    All other components within normal limits  URINE CULTURE  URINALYSIS, ROUTINE W REFLEX MICROSCOPIC  LIPASE, BLOOD  PREGNANCY, URINE    EKG None  Radiology CT Renal Stone Study  Result Date: 12/24/2019 CLINICAL DATA:  Flank pain EXAM: CT ABDOMEN AND PELVIS WITHOUT CONTRAST TECHNIQUE: Multidetector CT imaging of the abdomen and pelvis was performed following the standard protocol without IV contrast. COMPARISON:  None. FINDINGS: Lower chest: Minimal bilateral dependent atelectasis. Hepatobiliary: No focal liver abnormality is seen. No gallstones, gallbladder wall thickening, or biliary dilatation. Pancreas: Unremarkable. No pancreatic ductal dilatation or surrounding inflammatory changes. Spleen: Normal in size without focal abnormality. Adrenals/Urinary Tract: Adrenal glands are unremarkable. Kidneys are normal, without renal calculi, focal lesion, or hydronephrosis. Bladder is unremarkable. Stomach/Bowel: Stomach is within normal limits. Appendix appears normal. No evidence of bowel wall thickening, distention, or inflammatory changes. Vascular/Lymphatic: No significant vascular findings are present. No enlarged abdominal or pelvic lymph nodes. Reproductive: Uterus and bilateral adnexa are unremarkable. Other: No abdominal wall hernia or abnormality. Trace free fluid is seen in the pelvis and may be physiologic. Musculoskeletal: No acute or significant osseous findings. IMPRESSION: 1. No findings to explain the patient's symptoms. 2. Trace free fluid in the pelvis may be physiologic. Electronically Signed   By: Romona Curls M.D.   On: 12/24/2019 14:26    Procedures Procedures (including critical care time)  Medications Ordered in ED Medications - No data to display  ED Course  I have reviewed the triage vital signs and the nursing  notes.  Pertinent labs & imaging results that were available during my care of the patient were reviewed by me and considered in my  medical decision making (see chart for details).    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 Patient mildly hypertensive, afebrile, other vitals nonconcerning.  She is well-appearing, no acute distress, nontoxic sitting comfortably in the ER.  CBC without leukocytosis, very mild anemia with a hemoglobin of 11.6.  CMP without significant electrolyte abnormalities, renal dysfunction.  Lipase negative, UA negative.  CT negative for stones.  Negative pregnancy.  Patient notes that she has occasional dizziness and weakness but denies neuro deficits, chest pain, shortness of breath, syncope.  She is following with infectious disease at [facility] for her HSV.  Unclear etiology of her symptoms but given normal lab work, UA, well-appearing and in no acute distress, doubt renal stones, UTI, acute abdomen, TIA/stroke, ACS, intracranial hemorrhage.  No red flags.  At this point in ED course I believe she has been adequately screened and evaluated and stable for discharge.  She notes that she does not have a primary care provider in the area, I provided her a list of local providers with whom to follow-up with.  Encouraged to drink plenty of fluids.  Return precautions given.  Patient voiced understanding and is agreeable with this plan.  Final Clinical Impression(s) / ED Diagnoses Final diagnoses:  Right flank pain    Rx / DC Orders ED Discharge Orders    None       Leone Brand 12/24/19 1509    Eber Hong, MD 12/25/19 1719

## 2019-12-25 LAB — URINE CULTURE: Culture: NO GROWTH

## 2020-01-06 ENCOUNTER — Other Ambulatory Visit: Payer: Self-pay

## 2020-01-06 ENCOUNTER — Emergency Department (HOSPITAL_COMMUNITY): Payer: BLUE CROSS/BLUE SHIELD

## 2020-01-06 ENCOUNTER — Emergency Department (HOSPITAL_COMMUNITY)
Admission: EM | Admit: 2020-01-06 | Discharge: 2020-01-06 | Disposition: A | Discharge status: Home / Self Care | Payer: BLUE CROSS/BLUE SHIELD | Attending: Emergency Medicine | Admitting: Emergency Medicine

## 2020-01-06 DIAGNOSIS — Z79899 Other long term (current) drug therapy: Secondary | ICD-10-CM | POA: Insufficient documentation

## 2020-01-06 DIAGNOSIS — Z91040 Latex allergy status: Secondary | ICD-10-CM | POA: Insufficient documentation

## 2020-01-06 DIAGNOSIS — R42 Dizziness and giddiness: Secondary | ICD-10-CM | POA: Diagnosis present

## 2020-01-06 LAB — URINALYSIS, ROUTINE W REFLEX MICROSCOPIC
Bacteria, UA: NONE SEEN
Bilirubin Urine: NEGATIVE
Glucose, UA: NEGATIVE mg/dL
Ketones, ur: NEGATIVE mg/dL
Leukocytes,Ua: NEGATIVE
Nitrite: NEGATIVE
Protein, ur: NEGATIVE mg/dL
Specific Gravity, Urine: 1.023 (ref 1.005–1.030)
pH: 5 (ref 5.0–8.0)

## 2020-01-06 LAB — BASIC METABOLIC PANEL
Anion gap: 8 (ref 5–15)
BUN: 11 mg/dL (ref 6–20)
CO2: 26 mmol/L (ref 22–32)
Calcium: 9 mg/dL (ref 8.9–10.3)
Chloride: 104 mmol/L (ref 98–111)
Creatinine, Ser: 0.77 mg/dL (ref 0.44–1.00)
GFR calc Af Amer: >60 mL/min (ref >60)
GFR calc non Af Amer: >60 mL/min (ref >60)
Glucose, Bld: 95 mg/dL (ref 70–99)
Potassium: 3.7 mmol/L (ref 3.5–5.1)
Sodium: 138 mmol/L (ref 135–145)

## 2020-01-06 LAB — PREGNANCY, URINE: Preg Test, Ur: NEGATIVE

## 2020-01-06 LAB — CBC WITH DIFFERENTIAL/PLATELET
Abs Immature Granulocytes: 0.03 10*3/uL (ref 0.00–0.07)
Basophils Absolute: 0 10*3/uL (ref 0.0–0.1)
Basophils Relative: 0 %
Eosinophils Absolute: 0.1 10*3/uL (ref 0.0–0.5)
Eosinophils Relative: 1 %
HCT: 36.6 % (ref 36.0–46.0)
Hemoglobin: 11.5 g/dL — ABNORMAL LOW (ref 12.0–15.0)
Immature Granulocytes: 0 %
Lymphocytes Relative: 23 %
Lymphs Abs: 1.9 10*3/uL (ref 0.7–4.0)
MCH: 27.9 pg (ref 26.0–34.0)
MCHC: 31.4 g/dL (ref 30.0–36.0)
MCV: 88.8 fL (ref 80.0–100.0)
Monocytes Absolute: 0.5 10*3/uL (ref 0.1–1.0)
Monocytes Relative: 7 %
Neutro Abs: 5.6 10*3/uL (ref 1.7–7.7)
Neutrophils Relative %: 69 %
Platelets: 274 10*3/uL (ref 150–400)
RBC: 4.12 MIL/uL (ref 3.87–5.11)
RDW: 13.2 % (ref 11.5–15.5)
WBC: 8.1 10*3/uL (ref 4.0–10.5)
nRBC: 0 % (ref 0.0–0.2)

## 2020-01-06 MED ORDER — LIDOCAINE 5 % EX PTCH
1.0000 | MEDICATED_PATCH | Freq: Once | CUTANEOUS | Status: DC
  Administered 2020-01-06: 13:00:00 1 via TRANSDERMAL
  Filled 2020-01-06: qty 1

## 2020-01-06 MED ORDER — SODIUM CHLORIDE 0.9 % IV BOLUS
1000.0000 mL | Freq: Once | INTRAVENOUS | Status: AC
  Administered 2020-01-06: 12:00:00 1000 mL via INTRAVENOUS

## 2020-01-06 NOTE — ED Triage Notes (Signed)
Pt/ c/o of dizziness with neck pain and pain running down her spine. Patient states she recently saw her PCP and prescribed lyrica.

## 2020-01-06 NOTE — Discharge Instructions (Addendum)
You have been seen today for dizziness. Please read and follow all provided instructions. Return to the emergency room for worsening condition or new concerning symptoms.    Your blood work today was normal.  Your x-ray of your neck just showed you have some degenerative changes which as we talked about are common as we get older.  1. Medications:  Continue usual home medications Take medications as prescribed. Please review all of the medicines and only take them if you do not have an allergy to them.   2. Treatment: rest, drink plenty of fluids. Look into getting a therapist if needed -Recommend trying heat or ice for your neck pain as well as over-the-counter creams if needed.  3. Follow Up:  Please follow up with primary care provider by scheduling an appointment as soon as possible for a visit     ?

## 2020-01-06 NOTE — ED Notes (Signed)
Nurse spoke with pt concerning anxiety, signs symptoms and things she could do to help reduce it. Decrease caffeine, deep breathing and finding someone to talk with friend or therapy. Explained to pt how Celexa works and how Deontray Hunnicutt it takes to see results.

## 2020-01-06 NOTE — ED Provider Notes (Signed)
[facility] Provider Note   CSN: [id number] Arrival date & time: 01/06/20  1036     History Chief Complaint  Patient presents with  . Dizziness    Rachel Stevens is a 39 y.o. female with past medical history significant for chronic pelvic pain, HSV-2 presents to emergency department today with chief complaint of dizziness and neck pain.  Patient states she has had intermittent dizziness since 2017.  She states she only has it when driving.  The dizziness happened last night when she was driving to get dinner.  She states it was so bad she had to turn around and drive home.  She is also describing aching neck pain.  She saw chiropractor last week and had an adjustment.  She states ever since then she has had an intermittent pain in the base of her neck.  Pain is worse with movement.  She rates the pain 4 out of 10 in severity.  She tried massaging the area and applying ice last night with intermittent symptom relief. Denies any recent fall or head injury.  Also denies fever, chills, visual changes, headache, chest pain, shortness of breath, abdominal pain, nausea, vomiting, urinary symptoms, diarrhea, numbness, tingling, syncope, palpitations.  Chart review shows patient saw PCP on 01/03/2020.  She has symptoms of depression and anxiety but not on any medications for that.  She was started on Celexa 10 mg and advised to self increase to 20 mg.  He also had labs at that visit which show a sed rate of 52, TSH within normal range, slightly elevated B12 level at 904, no electrolyte abnormalities.  History provided by patient with additional history obtained from chart review.     Past Medical History:  Diagnosis Date  . Chronic pelvic pain in female   . Herpes   . 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 Irregular periods 01/31/2015  . Missed period 11/01/2014  . Patient desires pregnancy 11/01/2014  . Pelvic floor tension   . Vaginal discharge 11/01/2014  . Vulvar  pain   . Weight gain 11/01/2014    Patient Active Problem List   Diagnosis Date Noted  . Obesity BMI=30.9 07/05/2019  . Pelvic "burning" x3 years 07/05/2019  . History of reduction surgery of right breast 2009 07/05/2019  . Depression 07/05/2015  . Irregular periods 01/31/2015  . 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 REDUCTION MAMMAPLASTY Right      OB History    Gravida  0   Para      Term      Preterm      AB      Living        SAB      TAB      Ectopic      Multiple      Live Births              Family History  Problem Relation Age of Onset  . Hypertension Mother   . Heart disease Mother   . Cancer Father        throat  . Pancreatic cancer Father   . Congestive Heart Failure Brother   . Heart disease Maternal Grandmother   . Hypertension Maternal Grandmother   . Cancer Maternal Grandfather   . Hypertension Maternal Grandfather   . Cancer Paternal Grandmother        breast    Social  History   Tobacco Use  . Smoking status: Never Smoker  . Smokeless tobacco: Never Used  Substance Use Topics  . Alcohol use: No  . Drug use: No    Home Medications Prior to Admission medications   Medication Sig Start Date End Date Taking? Authorizing Provider  dicyclomine (BENTYL) 20 MG tablet Take 1 tablet (20 mg total) by mouth 3 (three) times daily as needed (abd pain). Patient not taking: Reported on 12/24/2019 12/19/19   Gilda Crease, MD  diphenoxylate-atropine (LOMOTIL) 2.5-0.025 MG tablet Take 1-2 tablets by mouth 4 (four) times daily as needed for diarrhea or loose stools. Patient not taking: Reported on 12/24/2019 12/19/19   Gilda Crease, MD  famciclovir (FAMVIR) 500 MG tablet Take 2 tablets (1,000 mg) twice per day for 1 day. Start ASAP w/in 6hr of symptom onset. Patient not taking: Reported on 12/24/2019 12/08/19   Samara Snide L, PA-C  metroNIDAZOLE (METROGEL  VAGINAL) 0.75 % vaginal gel Place 1 Applicatorful vaginally 2 (two) times daily. Patient not taking: Reported on 12/24/2019 12/08/19   Samara Snide L, PA-C  metroNIDAZOLE (METROGEL VAGINAL) 0.75 % vaginal gel Use 1 applicatorful per vagina at bedtime twice weekly for 4-6 months. Patient not taking: Reported on 12/24/2019 12/08/19   Samara Snide L, PA-C  Multiple Vitamins-Minerals (MULTIVITAMIN WITH MINERALS) tablet Take 1 tablet by mouth daily. Patient not taking: Reported on 12/24/2019 06/14/19   Matt Holmes, PA  ondansetron (ZOFRAN) 4 MG tablet Take 1 tablet (4 mg total) by mouth every 6 (six) hours as needed for nausea or vomiting. Patient not taking: Reported on 12/24/2019 12/19/19   Gilda Crease, MD  pantoprazole (PROTONIX) 20 MG tablet Take 1 tablet (20 mg total) by mouth daily for 30 days. Patient not taking: Reported on 12/24/2019 11/07/18 12/07/18  Samuel Jester, DO    Allergies    Bee venom, Latex, and Penicillins  Review of Systems   Review of Systems All other systems are reviewed and are negative for acute change except as noted in the HPI.  Physical Exam Updated Vital Signs BP 118/72 (BP Location: Left Arm)   Pulse 65   Temp 98.5 F (36.9 C) (Oral)   Resp 18   Ht 5\' 6"  (1.676 m)   Wt 83.9 kg   LMP 12/13/2019   SpO2 100%   BMI 29.86 kg/m   Physical Exam Vitals and nursing note reviewed.  Constitutional:      General: She is not in acute distress.    Appearance: She is not ill-appearing.  HENT:     Head: Normocephalic and atraumatic.     Right Ear: Tympanic membrane and external ear normal.     Left Ear: Tympanic membrane and external ear normal.     Nose: Nose normal.     Mouth/Throat:     Mouth: Mucous membranes are moist.     Pharynx: Oropharynx is clear.  Eyes:     General: No scleral icterus.       Right eye: No discharge.        Left eye: No discharge.     Extraocular Movements: Extraocular movements intact.     Conjunctiva/sclera:  Conjunctivae normal.     Pupils: Pupils are equal, round, and reactive to light.  Neck:     Vascular: No JVD.      Comments: Tenderness to palpation as depicted in image above.  No overlying skin changes. No crepitus or deformity or step-off.  Full range of motion of  neck.  No meningeal signs. Cardiovascular:     Rate and Rhythm: Normal rate and regular rhythm.     Pulses: Normal pulses.          Radial pulses are 2+ on the right side and 2+ on the left side.     Heart sounds: Normal heart sounds.  Pulmonary:     Comments: Lungs clear to auscultation in all fields. Symmetric chest rise. No wheezing, rales, or rhonchi. Abdominal:     Comments: Abdomen is soft, non-distended, and non-tender in all quadrants. No rigidity, no guarding. No peritoneal signs.  Musculoskeletal:        General: Normal range of motion.     Cervical back: Normal range of motion. Tenderness present.  Skin:    General: Skin is warm and dry.     Capillary Refill: Capillary refill takes less than 2 seconds.     Findings: No rash.  Neurological:     Mental Status: She is oriented to person, place, and time.     GCS: GCS eye subscore is 4. GCS verbal subscore is 5. GCS motor subscore is 6.     Comments:  Speech is clear and goal oriented, follows commands CN III-XII intact, no facial droop Normal strength in upper and lower extremities bilaterally including dorsiflexion and plantar flexion, strong and equal grip strength Sensation normal to light and sharp touch Moves extremities without ataxia, coordination intact Normal finger to nose and rapid alternating movements Normal gait and balance   Psychiatric:        Mood and Affect: Mood is anxious.        Behavior: Behavior normal.       ED Results / Procedures / Treatments   Labs (all labs ordered are listed, but only abnormal results are displayed) Labs Reviewed  CBC WITH DIFFERENTIAL/PLATELET - Abnormal; Notable for the following components:      Result  Value   Hemoglobin 11.5 (*)    All other components within normal limits  URINALYSIS, ROUTINE W REFLEX MICROSCOPIC - Abnormal; Notable for the following components:   Hgb urine dipstick SMALL (*)    All other components within normal limits  PREGNANCY, URINE  BASIC METABOLIC PANEL    EKG None  Radiology DG Cervical Spine Complete  Result Date: 01/06/2020 CLINICAL DATA:  Neck and back pain without known injury. EXAM: CERVICAL SPINE - COMPLETE 4+ VIEW COMPARISON:  None. FINDINGS: There is reversal of normal lordosis centered at C4-5. No other malalignment. Minimal degenerative changes at C5-6. No fractures are identified. The pre odontoid space is normal. Prevertebral soft tissues are unremarkable. The neural foramina are patent. The lung apices are normal. IMPRESSION: No acute abnormalities.  Minimal degenerative changes. Electronically Signed   By: Gerome Sam III M.D   On: 01/06/2020 12:58    Procedures Procedures (including critical care time)  Medications Ordered in ED Medications  lidocaine (LIDODERM) 5 % 1 patch (1 patch Transdermal Patch Applied 01/06/20 1235)  sodium chloride 0.9 % bolus 1,000 mL (0 mLs Intravenous Stopped 01/06/20 1329)    ED Course  I have reviewed the triage vital signs and the nursing notes.  Pertinent labs & imaging results that were available during my care of the patient were reviewed by me and considered in my medical decision making (see chart for details).    MDM Rules/Calculators/A&P                      Patient seen and  examined. Patient presents awake, alert, hemodynamically stable, afebrile, non toxic.  She denies any dizziness currently.  Neuro exam is normal, no focal findings.  She does have mild tenderness to palpation of her cervical spine, no midline tenderness, no step-off or deformity.  Does appear anxious.  Basic labs checked and are unremarkable.  No leukocytosis, no anemia, no severe electrolyte derangement, no renal  insufficiency.  UA without signs of infection.  Pregnancy test is negative.  X-ray of cervical spine viewed by me shows degenerative changes, no acute fracture or dislocation.  Patient given lidocaine patch and on reassessment reports symptoms have improved.  Her dizziness is not vertiginous in nature.  She recently started Celexa but has only taken it 1 time.  Had lengthy discussion with patient regarding possible anxiety as cause of her symptoms.  She is agreeable to continue Celexa in an attempt to help prove her anxiety.  Also recommended talk therapy patient appears receptive to.  Given her reassuring work-up here will discharge home and recommend PCP follow-up.  The patient appears reasonably screened and/or stabilized for discharge and I doubt any other medical condition or other [facility] requiring further screening, evaluation, or treatment in the ED at this time prior to discharge. The patient is safe for discharge with strict return precautions discussed.   Portions of this note were generated with Scientist, clinical (histocompatibility and immunogenetics). Dictation errors may occur despite best attempts at proofreading.   Final Clinical Impression(s) / ED Diagnoses Final diagnoses:  Dizziness    Rx / DC Orders ED Discharge Orders    None       Sherene Sires, PA-C 01/06/20 1338    Vanetta Mulders, MD 01/07/20 [phone]

## 2020-01-09 ENCOUNTER — Ambulatory Visit: Payer: Self-pay

## 2020-01-12 ENCOUNTER — Ambulatory Visit: Payer: Self-pay

## 2020-02-15 ENCOUNTER — Emergency Department
Admission: EM | Admit: 2020-02-15 | Discharge: 2020-02-15 | Disposition: A | Discharge status: Home / Self Care | Payer: BLUE CROSS/BLUE SHIELD | Attending: Emergency Medicine | Admitting: Emergency Medicine

## 2020-02-15 ENCOUNTER — Other Ambulatory Visit: Payer: Self-pay

## 2020-02-15 DIAGNOSIS — H538 Other visual disturbances: Secondary | ICD-10-CM | POA: Insufficient documentation

## 2020-02-15 DIAGNOSIS — Z5321 Procedure and treatment not carried out due to patient leaving prior to being seen by health care provider: Secondary | ICD-10-CM | POA: Insufficient documentation

## 2020-02-15 DIAGNOSIS — R42 Dizziness and giddiness: Secondary | ICD-10-CM | POA: Diagnosis not present

## 2020-02-15 LAB — GLUCOSE, CAPILLARY: Glucose-Capillary: 84 mg/dL (ref 70–99)

## 2020-02-15 LAB — BASIC METABOLIC PANEL
Anion gap: 9 (ref 5–15)
BUN: 7 mg/dL (ref 6–20)
CO2: 25 mmol/L (ref 22–32)
Calcium: 8.9 mg/dL (ref 8.9–10.3)
Chloride: 105 mmol/L (ref 98–111)
Creatinine, Ser: 0.88 mg/dL (ref 0.44–1.00)
GFR calc Af Amer: >60 mL/min (ref >60)
GFR calc non Af Amer: >60 mL/min (ref >60)
Glucose, Bld: 99 mg/dL (ref 70–99)
Potassium: 3.6 mmol/L (ref 3.5–5.1)
Sodium: 139 mmol/L (ref 135–145)

## 2020-02-15 LAB — URINALYSIS, COMPLETE (UACMP) WITH MICROSCOPIC
Bilirubin Urine: NEGATIVE
Glucose, UA: NEGATIVE mg/dL
Hgb urine dipstick: NEGATIVE
Ketones, ur: 80 mg/dL — AB
Leukocytes,Ua: NEGATIVE
Nitrite: NEGATIVE
Protein, ur: NEGATIVE mg/dL
Specific Gravity, Urine: 1.013 (ref 1.005–1.030)
pH: 6 (ref 5.0–8.0)

## 2020-02-15 LAB — CBC
HCT: 35.6 % — ABNORMAL LOW (ref 36.0–46.0)
Hemoglobin: 11.4 g/dL — ABNORMAL LOW (ref 12.0–15.0)
MCH: 27.7 pg (ref 26.0–34.0)
MCHC: 32 g/dL (ref 30.0–36.0)
MCV: 86.4 fL (ref 80.0–100.0)
Platelets: 279 10*3/uL (ref 150–400)
RBC: 4.12 MIL/uL (ref 3.87–5.11)
RDW: 12.7 % (ref 11.5–15.5)
WBC: 10.4 10*3/uL (ref 4.0–10.5)
nRBC: 0 % (ref 0.0–0.2)

## 2020-02-15 NOTE — ED Triage Notes (Signed)
Pt comes POV with blurred vision in both eyes and some dizziness today. Pt states her BP was high this morning and pt doesn't take any BP meds. Pt has been seen at [facility] for "inflammation" and her "sed rates" but states that MD can't find what's causing it. Neurologically intact otherwise.

## 2020-02-15 NOTE — ED Notes (Signed)
Pt given water and a urine cup

## 2020-02-16 LAB — POCT PREGNANCY, URINE: Preg Test, Ur: NEGATIVE

## 2020-02-21 DIAGNOSIS — R030 Elevated blood-pressure reading, without diagnosis of hypertension: Secondary | ICD-10-CM | POA: Insufficient documentation

## 2020-02-22 ENCOUNTER — Emergency Department (HOSPITAL_COMMUNITY)
Admission: EM | Admit: 2020-02-22 | Discharge: 2020-02-22 | Disposition: A | Discharge status: Home / Self Care | Payer: BLUE CROSS/BLUE SHIELD | Attending: Emergency Medicine | Admitting: Emergency Medicine

## 2020-02-22 ENCOUNTER — Encounter (HOSPITAL_COMMUNITY): Payer: Self-pay | Admitting: *Deleted

## 2020-02-22 ENCOUNTER — Other Ambulatory Visit: Payer: Self-pay

## 2020-02-22 DIAGNOSIS — K219 Gastro-esophageal reflux disease without esophagitis: Secondary | ICD-10-CM | POA: Insufficient documentation

## 2020-02-22 DIAGNOSIS — R109 Unspecified abdominal pain: Secondary | ICD-10-CM | POA: Diagnosis present

## 2020-02-22 LAB — URINALYSIS, ROUTINE W REFLEX MICROSCOPIC
Bilirubin Urine: NEGATIVE
Glucose, UA: NEGATIVE mg/dL
Hgb urine dipstick: NEGATIVE
Ketones, ur: NEGATIVE mg/dL
Leukocytes,Ua: NEGATIVE
Nitrite: NEGATIVE
Protein, ur: NEGATIVE mg/dL
Specific Gravity, Urine: 1.009 (ref 1.005–1.030)
pH: 8 (ref 5.0–8.0)

## 2020-02-22 LAB — COMPREHENSIVE METABOLIC PANEL
ALT: 15 U/L (ref 0–44)
AST: 18 U/L (ref 15–41)
Albumin: 4.1 g/dL (ref 3.5–5.0)
Alkaline Phosphatase: 64 U/L (ref 38–126)
Anion gap: 8 (ref 5–15)
BUN: 8 mg/dL (ref 6–20)
CO2: 25 mmol/L (ref 22–32)
Calcium: 9.3 mg/dL (ref 8.9–10.3)
Chloride: 105 mmol/L (ref 98–111)
Creatinine, Ser: 0.81 mg/dL (ref 0.44–1.00)
GFR calc Af Amer: >60 mL/min (ref >60)
GFR calc non Af Amer: >60 mL/min (ref >60)
Glucose, Bld: 92 mg/dL (ref 70–99)
Potassium: 4.1 mmol/L (ref 3.5–5.1)
Sodium: 138 mmol/L (ref 135–145)
Total Bilirubin: 0.3 mg/dL (ref 0.3–1.2)
Total Protein: 7.8 g/dL (ref 6.5–8.1)

## 2020-02-22 LAB — CBC
HCT: 37.8 % (ref 36.0–46.0)
Hemoglobin: 11.8 g/dL — ABNORMAL LOW (ref 12.0–15.0)
MCH: 27.9 pg (ref 26.0–34.0)
MCHC: 31.2 g/dL (ref 30.0–36.0)
MCV: 89.4 fL (ref 80.0–100.0)
Platelets: 280 10*3/uL (ref 150–400)
RBC: 4.23 MIL/uL (ref 3.87–5.11)
RDW: 13.2 % (ref 11.5–15.5)
WBC: 8.5 10*3/uL (ref 4.0–10.5)
nRBC: 0 % (ref 0.0–0.2)

## 2020-02-22 LAB — POC URINE PREG, ED: Preg Test, Ur: NEGATIVE

## 2020-02-22 LAB — LIPASE, BLOOD: Lipase: 25 U/L (ref 11–51)

## 2020-02-22 MED ORDER — LIDOCAINE VISCOUS HCL 2 % MT SOLN: 15.0000 mL | Freq: Once | OROMUCOSAL | Status: AC

## 2020-02-22 MED ORDER — ALUM & MAG HYDROXIDE-SIMETH 200-200-20 MG/5ML PO SUSP: 30.0000 mL | Freq: Once | ORAL | Status: AC

## 2020-02-22 MED ORDER — LIDOCAINE VISCOUS HCL 2 % MT SOLN
OROMUCOSAL | Status: AC
  Administered 2020-02-22: 15 mL via ORAL
  Filled 2020-02-22: qty 15

## 2020-02-22 MED ORDER — ALUM & MAG HYDROXIDE-SIMETH 200-200-20 MG/5ML PO SUSP
ORAL | Status: AC
  Administered 2020-02-22: 30 mL via ORAL
  Filled 2020-02-22: qty 30

## 2020-02-22 NOTE — ED Triage Notes (Signed)
Epigastric pain for 2 weeks, states she feels a burning when eating

## 2020-02-22 NOTE — ED Provider Notes (Signed)
[facility] EMERGENCY DEPARTMENT Provider Note   CSN: [id number] Arrival date & time: 02/22/20  1122     History Chief Complaint  Patient presents with  . Abdominal Pain    Rachel Stevens is a 39 y.o. female.  HPI   Patient with chronic gastric reflux comes into complaint of gastric reflux pain.  Her primary care physician is already started on Protonix and she has been taking 40 mg for the last week.  She already has a abdominal ultrasound scheduled for next Friday.  She is not pregnant because she is not been sexually active, she has no dysuria, no bowel complaints, no shortness of breath, no chest pain and no diagnosed history of ulcers.  She is not had any melena and no recent injuries.  Past Medical History:  Diagnosis Date  . Chronic pelvic pain in female   . Herpes   . 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 Irregular periods 01/31/2015  . Missed period 11/01/2014  . Patient desires pregnancy 11/01/2014  . Pelvic floor tension   . Vaginal discharge 11/01/2014  . Vulvar pain   . Weight gain 11/01/2014    Patient Active Problem List   Diagnosis Date Noted  . Obesity BMI=30.9 07/05/2019  . Pelvic "burning" x3 years 07/05/2019  . History of reduction surgery of right breast 2009 07/05/2019  . Depression 07/05/2015  . Irregular periods 01/31/2015  . 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 REDUCTION MAMMAPLASTY Right      OB History    Gravida  0   Para      Term      Preterm      AB      Living        SAB      TAB      Ectopic      Multiple      Live Births              Family History  Problem Relation Age of Onset  . Hypertension Mother   . Heart disease Mother   . Cancer Father        throat  . Pancreatic cancer Father   . Congestive Heart Failure Brother   . Heart disease Maternal Grandmother   . Hypertension Maternal Grandmother   . Cancer Maternal  Grandfather   . Hypertension Maternal Grandfather   . Cancer Paternal Grandmother        breast    Social History   Tobacco Use  . Smoking status: Never Smoker  . Smokeless tobacco: Never Used  Substance Use Topics  . Alcohol use: No  . Drug use: No    Home Medications Prior to Admission medications   Medication Sig Start Date End Date Taking? Authorizing Provider  dicyclomine (BENTYL) 20 MG tablet Take 1 tablet (20 mg total) by mouth 3 (three) times daily as needed (abd pain). Patient not taking: Reported on 12/24/2019 12/19/19   Gilda Crease, MD  diphenoxylate-atropine (LOMOTIL) 2.5-0.025 MG tablet Take 1-2 tablets by mouth 4 (four) times daily as needed for diarrhea or loose stools. Patient not taking: Reported on 12/24/2019 12/19/19   Gilda Crease, MD  famciclovir (FAMVIR) 500 MG tablet Take 2 tablets (1,000 mg) twice per day for 1 day. Start ASAP w/in 6hr of symptom onset. Patient not taking: Reported on 12/24/2019 12/08/19   Samara Snide L, PA-C  metroNIDAZOLE (  METROGEL VAGINAL) 0.75 % vaginal gel Place 1 Applicatorful vaginally 2 (two) times daily. Patient not taking: Reported on 12/24/2019 12/08/19   Charlotte Sanes L, PA-C  metroNIDAZOLE (METROGEL VAGINAL) 0.75 % vaginal gel Use 1 applicatorful per vagina at bedtime twice weekly for 4-6 months. Patient not taking: Reported on 12/24/2019 12/08/19   Charlotte Sanes L, PA-C  Multiple Vitamins-Minerals (MULTIVITAMIN WITH MINERALS) tablet Take 1 tablet by mouth daily. Patient not taking: Reported on 12/24/2019 06/14/19   Jerene Dilling, PA  ondansetron (ZOFRAN) 4 MG tablet Take 1 tablet (4 mg total) by mouth every 6 (six) hours as needed for nausea or vomiting. Patient not taking: Reported on 12/24/2019 12/19/19   Orpah Greek, MD  pantoprazole (PROTONIX) 20 MG tablet Take 1 tablet (20 mg total) by mouth daily for 30 days. Patient not taking: Reported on 12/24/2019 11/07/18 12/07/18  Francine Graven, DO     Allergies    Bee venom, Latex, and Penicillins  Review of Systems   Review of Systems  Constitutional: Negative.   HENT: Negative.   Eyes: Negative.   Respiratory: Negative.   Cardiovascular: Positive for chest pain.       Gastric reflux sensation  Gastrointestinal: Positive for abdominal pain. Negative for abdominal distention, anal bleeding, blood in stool, constipation, diarrhea, nausea, rectal pain and vomiting.       Gastric reflux pain  Genitourinary: Negative.   Musculoskeletal: Negative.   Skin: Negative.   Neurological: Negative.   Psychiatric/Behavioral: Negative.     Physical Exam Updated Vital Signs BP 115/75   Pulse 68   Temp 98.3 F (36.8 C) (Oral)   Resp 18   Ht 5\' 6"  (1.676 m)   Wt 84.8 kg   LMP 02/04/2020   SpO2 100%   BMI 30.18 kg/m   Physical Exam Vitals and nursing note reviewed.  Constitutional:      Appearance: She is well-developed and normal weight.  HENT:     Head: Normocephalic.     Mouth/Throat:     Mouth: Mucous membranes are moist.  Cardiovascular:     Rate and Rhythm: Normal rate and regular rhythm.     Heart sounds: Normal heart sounds.  Pulmonary:     Effort: Pulmonary effort is normal. No respiratory distress.     Breath sounds: Normal breath sounds. No wheezing or rales.  Abdominal:     General: Abdomen is flat. Bowel sounds are normal. There is no distension.     Palpations: Abdomen is soft. There is no shifting dullness, hepatomegaly or mass.     Tenderness: There is no abdominal tenderness. There is no guarding or rebound. Negative signs include Murphy's sign and McBurney's sign.  Skin:    General: Skin is warm and dry.  Neurological:     General: No focal deficit present.     Mental Status: She is alert and oriented to person, place, and time.  Psychiatric:        Mood and Affect: Mood normal.        Behavior: Behavior normal.     ED Results / Procedures / Treatments   Labs (all labs ordered are listed, but  only abnormal results are displayed) Labs Reviewed  CBC - Abnormal; Notable for the following components:      Result Value   Hemoglobin 11.8 (*)    All other components within normal limits  LIPASE, BLOOD  COMPREHENSIVE METABOLIC PANEL  URINALYSIS, ROUTINE W REFLEX MICROSCOPIC  POC URINE PREG, ED  EKG None  Radiology No results found.  Procedures Procedures (including critical care time)  Medications Ordered in ED Medications  alum & mag hydroxide-simeth (MAALOX/MYLANTA) 200-200-20 MG/5ML suspension 30 mL (30 mLs Oral Given 02/22/20 1439)    And  lidocaine (XYLOCAINE) 2 % viscous mouth solution 15 mL (15 mLs Oral Given 02/22/20 1440)    ED Course  I have reviewed the triage vital signs and the nursing notes.  Pertinent labs & imaging results that were available during my care of the patient were reviewed by me and considered in my medical decision making (see chart for details).    MDM Rules/Calculators/A&P                      Patient is very well-appearing with reassuring exam, this is a chronic condition for which the PCP is already scheduled work-up.  She has an ultrasound scheduled next week.  We have discussed that she does not have any emergent symptoms and have went over suggested outpatient follow-up with her, return precautions have been discussed and symptoms have improved slightly since giving GI cocktail.  Patient safe for outpatient follow-up and will be discharged   Final Clinical Impression(s) / ED Diagnoses Final diagnoses:  Gastroesophageal reflux disease, unspecified whether esophagitis present    Rx / DC Orders ED Discharge Orders    None       Marthenia Rolling, DO 02/22/20 1512    Blane Ohara, MD 03/03/20 0013

## 2020-02-22 NOTE — Discharge Instructions (Signed)
Today we talked about the gastric reflux symptoms he been having, likely all the work-up appeared to be very normal did not show any indication of something that was an emergency.  Please continue to follow-up with your primary care physician as you try to resolve the symptoms.  We would suggest that you continue taking the Protonix that they have been given you, make sure you follow-up on what ever ultrasound that you have planned.  He can also discuss with them the potential of starting a gastric antihistamine, the appropriateness of H. pylori testing, or the appropriateness of potentially speaking with GI about getting a scope to look at your throat.  If any symptoms get significantly worse please reach out and speak to a physician immediately so we can try to keep you safe.

## 2020-02-26 ENCOUNTER — Emergency Department
Admission: EM | Admit: 2020-02-26 | Discharge: 2020-02-26 | Disposition: A | Discharge status: Home / Self Care | Payer: BLUE CROSS/BLUE SHIELD | Attending: Emergency Medicine | Admitting: Emergency Medicine

## 2020-02-26 ENCOUNTER — Encounter: Payer: Self-pay | Admitting: Emergency Medicine

## 2020-02-26 ENCOUNTER — Other Ambulatory Visit: Payer: Self-pay

## 2020-02-26 DIAGNOSIS — Z91040 Latex allergy status: Secondary | ICD-10-CM | POA: Insufficient documentation

## 2020-02-26 DIAGNOSIS — R1013 Epigastric pain: Secondary | ICD-10-CM | POA: Diagnosis not present

## 2020-02-26 LAB — LIPASE, BLOOD: Lipase: 26 U/L (ref 11–51)

## 2020-02-26 LAB — CBC
HCT: 36 % (ref 36.0–46.0)
Hemoglobin: 12.2 g/dL (ref 12.0–15.0)
MCH: 28.3 pg (ref 26.0–34.0)
MCHC: 33.9 g/dL (ref 30.0–36.0)
MCV: 83.5 fL (ref 80.0–100.0)
Platelets: 292 10*3/uL (ref 150–400)
RBC: 4.31 MIL/uL (ref 3.87–5.11)
RDW: 13 % (ref 11.5–15.5)
WBC: 9.2 10*3/uL (ref 4.0–10.5)
nRBC: 0 % (ref 0.0–0.2)

## 2020-02-26 LAB — COMPREHENSIVE METABOLIC PANEL
ALT: 13 U/L (ref 0–44)
AST: 18 U/L (ref 15–41)
Albumin: 4.2 g/dL (ref 3.5–5.0)
Alkaline Phosphatase: 67 U/L (ref 38–126)
Anion gap: 7 (ref 5–15)
BUN: 6 mg/dL (ref 6–20)
CO2: 27 mmol/L (ref 22–32)
Calcium: 9.2 mg/dL (ref 8.9–10.3)
Chloride: 105 mmol/L (ref 98–111)
Creatinine, Ser: 0.83 mg/dL (ref 0.44–1.00)
GFR calc Af Amer: >60 mL/min (ref >60)
GFR calc non Af Amer: >60 mL/min (ref >60)
Glucose, Bld: 98 mg/dL (ref 70–99)
Potassium: 3.7 mmol/L (ref 3.5–5.1)
Sodium: 139 mmol/L (ref 135–145)
Total Bilirubin: 0.8 mg/dL (ref 0.3–1.2)
Total Protein: 8 g/dL (ref 6.5–8.1)

## 2020-02-26 MED ORDER — SUCRALFATE 1 G PO TABS
1.0000 g | ORAL_TABLET | Freq: Four times a day (QID) | ORAL | 0 refills | Status: DC
Start: 2020-02-26 — End: 2020-03-04

## 2020-02-26 MED ORDER — SODIUM CHLORIDE 0.9% FLUSH: 3.0000 mL | Freq: Once | INTRAVENOUS | Status: DC

## 2020-02-26 NOTE — ED Notes (Signed)
Patient stated that she is having abdominal pain, that has been going on for two weeks. Patient stated that she omitted her water last night, which water is the only thing she can keep down  Dr. Derrill Kay speaking with her now

## 2020-02-26 NOTE — ED Notes (Signed)
Patient discharged home to wife, patient received discharge papers and prescription for Celexa. Patient appropriate and cooperative, Vital signs taken. NAD noted.

## 2020-02-26 NOTE — ED Triage Notes (Signed)
Pt presents to ED via POV with c/o mid upper abdominal pain. Pt states just seen for same at [facility]. Pt states was given a GI cocktail at [facility], but wasn't dx with anything. Pt A&O x4, ambulatory without difficulty.  Pt states pain relieved with ice pack. Pt states initially just stopped in to use the bathroom then decided to be seen again.

## 2020-02-26 NOTE — Discharge Instructions (Signed)
Please seek medical attention for any high fevers, chest pain, shortness of breath, change in behavior, persistent vomiting, bloody stool or any other new or concerning symptoms.  

## 2020-02-26 NOTE — ED Provider Notes (Signed)
[facility] Emergency Department Provider Note  ____________________________________________   I have reviewed the triage vital signs and the nursing notes.   HISTORY  Chief Complaint Abdominal Pain   History limited by: Not Limited   HPI Rachel Stevens is a 39 y.o. female who presents to the emergency department today because of concerns for continued epigastric pain.  Patient describes it as burning.  It has been present for the past few weeks.  She states she has been trying to take Protonix without any significant relief.  She has also altered her diet without any change.  She says that almost any food makes the burning occur.  Patient has not had any fevers.  She has been seen in the emergency department and by primary care physician for the same complaint. She says she does have history of acid reflux in the past.    Records reviewed. Per medical record review patient has a history of recent ER visit for the same symptoms.   Past Medical History:  Diagnosis Date  . Chronic pelvic pain in female   . Herpes   . 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 Irregular periods 01/31/2015  . Missed period 11/01/2014  . Patient desires pregnancy 11/01/2014  . Pelvic floor tension   . Vaginal discharge 11/01/2014  . Vulvar pain   . Weight gain 11/01/2014    Patient Active Problem List   Diagnosis Date Noted  . Obesity BMI=30.9 07/05/2019  . Pelvic "burning" x3 years 07/05/2019  . History of reduction surgery of right breast 2009 07/05/2019  . Depression 07/05/2015  . Irregular periods 01/31/2015  . 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 REDUCTION MAMMAPLASTY Right     Prior to Admission medications   Medication Sig Start Date End Date Taking? Authorizing Provider  dicyclomine (BENTYL) 20 MG tablet Take 1 tablet (20 mg total) by mouth 3 (three) times daily as needed (abd  pain). Patient not taking: Reported on 12/24/2019 12/19/19   Gilda Crease, MD  diphenoxylate-atropine (LOMOTIL) 2.5-0.025 MG tablet Take 1-2 tablets by mouth 4 (four) times daily as needed for diarrhea or loose stools. Patient not taking: Reported on 12/24/2019 12/19/19   Gilda Crease, MD  famciclovir (FAMVIR) 500 MG tablet Take 2 tablets (1,000 mg) twice per day for 1 day. Start ASAP w/in 6hr of symptom onset. Patient not taking: Reported on 12/24/2019 12/08/19   Samara Snide L, PA-C  metroNIDAZOLE (METROGEL VAGINAL) 0.75 % vaginal gel Place 1 Applicatorful vaginally 2 (two) times daily. Patient not taking: Reported on 12/24/2019 12/08/19   Samara Snide L, PA-C  metroNIDAZOLE (METROGEL VAGINAL) 0.75 % vaginal gel Use 1 applicatorful per vagina at bedtime twice weekly for 4-6 months. Patient not taking: Reported on 12/24/2019 12/08/19   Samara Snide L, PA-C  Multiple Vitamins-Minerals (MULTIVITAMIN WITH MINERALS) tablet Take 1 tablet by mouth daily. Patient not taking: Reported on 12/24/2019 06/14/19   Matt Holmes, PA  ondansetron (ZOFRAN) 4 MG tablet Take 1 tablet (4 mg total) by mouth every 6 (six) hours as needed for nausea or vomiting. Patient not taking: Reported on 12/24/2019 12/19/19   Gilda Crease, MD  pantoprazole (PROTONIX) 20 MG tablet Take 1 tablet (20 mg total) by mouth daily for 30 days. Patient not taking: Reported on 12/24/2019 11/07/18 12/07/18  Samuel Jester, DO    Allergies Bee venom, Latex, and Penicillins  Family History  Problem Relation Age of Onset  . Hypertension Mother   . Heart disease Mother   . Cancer Father        throat  . Pancreatic cancer Father   . Congestive Heart Failure Brother   . Heart disease Maternal Grandmother   . Hypertension Maternal Grandmother   . Cancer Maternal Grandfather   . Hypertension Maternal Grandfather   . Cancer Paternal Grandmother        breast    Social History Social History   Tobacco Use   . Smoking status: Never Smoker  . Smokeless tobacco: Never Used  Substance Use Topics  . Alcohol use: No  . Drug use: No    Review of Systems Constitutional: No fever/chills Eyes: No visual changes. ENT: No sore throat. Cardiovascular: Denies chest pain. Respiratory: Denies shortness of breath. Gastrointestinal: Positive for epigastric buring.  Genitourinary: Negative for dysuria. Musculoskeletal: Negative for back pain. Skin: Negative for rash. Neurological: Negative for headaches, focal weakness or numbness.  ____________________________________________   PHYSICAL EXAM:  VITAL SIGNS: ED Triage Vitals  Enc Vitals Group     BP 02/26/20 1419 119/83     Pulse Rate 02/26/20 1419 83     Resp 02/26/20 1419 20     Temp 02/26/20 1419 98.5 F (36.9 C)     Temp Source 02/26/20 1419 Oral     SpO2 02/26/20 1419 100 %     Weight 02/26/20 1420 187 lb (84.8 kg)     Height 02/26/20 1420 5\' 6"  (1.676 m)     Head Circumference --      Peak Flow --      Pain Score 02/26/20 1419 5    Constitutional: Alert and oriented.  Eyes: Conjunctivae are normal.  ENT      Head: Normocephalic and atraumatic.      Nose: No congestion/rhinnorhea.      Mouth/Throat: Mucous membranes are moist.      Neck: No stridor. Hematological/Lymphatic/Immunilogical: No cervical lymphadenopathy. Cardiovascular: Normal rate, regular rhythm.  No murmurs, rubs, or gallops.  Respiratory: Normal respiratory effort without tachypnea nor retractions. Breath sounds are clear and equal bilaterally. No wheezes/rales/rhonchi. Gastrointestinal: Soft and non tender. No rebound. No guarding.  Genitourinary: Deferred Musculoskeletal: Normal range of motion in all extremities. No lower extremity edema. Neurologic:  Normal speech and language. No gross focal neurologic deficits are appreciated.  Skin:  Skin is warm, dry and intact. No rash noted. Psychiatric: Mood and affect are normal. Speech and behavior are normal.  Patient exhibits appropriate insight and judgment.  ____________________________________________    LABS (pertinent positives/negatives)  Lipase 26 CBC wbc 9.2, hgb 12.2, plt 292 CMP wnl  ____________________________________________   EKG  None  ____________________________________________    RADIOLOGY  None  ____________________________________________   PROCEDURES  Procedures  ____________________________________________   INITIAL IMPRESSION / ASSESSMENT AND PLAN / ED COURSE  Pertinent labs & imaging results that were available during my care of the patient were reviewed by me and considered in my medical decision making (see chart for details).   Patient presents to the emergency department today because of concern for abdominal pain. Blood work without concerning abnormalities. Does sound like gastritis is likely. Will add on sucralfate. Discussed with patient importance of follow up with GI.   ____________________________________________   FINAL CLINICAL IMPRESSION(S) / ED DIAGNOSES  Final diagnoses:  Epigastric pain     Note: This dictation was prepared with Dragon dictation. Any transcriptional errors that result from this process are unintentional  Nance Pear, MD 02/26/20 [phone]

## 2020-03-04 ENCOUNTER — Ambulatory Visit: Payer: BLUE CROSS/BLUE SHIELD | Admitting: Family Medicine

## 2020-03-04 ENCOUNTER — Ambulatory Visit (LOCAL_COMMUNITY_HEALTH_CENTER): Payer: BLUE CROSS/BLUE SHIELD | Admitting: Family Medicine

## 2020-03-04 VITALS — BP 121/79 | Ht 65.0 in | Wt 169.0 lb

## 2020-03-04 DIAGNOSIS — N63 Unspecified lump in unspecified breast: Secondary | ICD-10-CM | POA: Insufficient documentation

## 2020-03-04 DIAGNOSIS — Z30013 Encounter for initial prescription of injectable contraceptive: Secondary | ICD-10-CM | POA: Diagnosis not present

## 2020-03-04 DIAGNOSIS — Z3009 Encounter for other general counseling and advice on contraception: Secondary | ICD-10-CM | POA: Diagnosis not present

## 2020-03-04 LAB — WET PREP FOR TRICH, YEAST, CLUE
Trichomonas Exam: NEGATIVE
Yeast Exam: NEGATIVE

## 2020-03-04 MED ORDER — METRONIDAZOLE 500 MG PO TABS: 500.0000 mg | ORAL_TABLET | Freq: Two times a day (BID) | ORAL | 0 refills | Status: AC

## 2020-03-04 MED ORDER — MEDROXYPROGESTERONE ACETATE 150 MG/ML IM SUSP
150.0000 mg | INTRAMUSCULAR | Status: AC
  Administered 2020-03-04 – 2020-05-20 (×2): 150 mg via INTRAMUSCULAR

## 2020-03-04 NOTE — Progress Notes (Signed)
Family Planning Visit  Subjective:  Rachel Stevens is a 39 y.o. being seen today for  Chief Complaint  Patient presents with  . Contraception    start depo [facility] problem visit)  . SEXUALLY TRANSMITTED DISEASE    STD screening including bloodwork    Pt has HSV-2 (herpes simplex virus 2) infection; Irregular periods; Depression; Obesity BMI=30.9; Pelvic "burning" x3 years; History of reduction surgery of right breast 2009; Dizziness of unknown cause; Elevated blood pressure reading; Fatigue; Female infertility; Vitamin B12 deficiency; Vitamin D insufficiency; Vulvar pain; and Breast mass in female on their problem list.  HPI  Patient reports she would like STI testing and to restart depo. Last used depo years ago.  Pt denies all of the following, which are contraindications to Depo use: Known breast cancer Pregnancy Also denies: Hypertension (CDC cat 2 if mild, cat 3 if severe) Severe cirrhosis, hepatocellular adenoma Diabetes with nephrosis or vascular complications Ischemic heart disease or multiple risk factors for atherosclerotic disease, and some forms of lupus Unexplained vaginal bleeding Pregnancy planned within the next year Long-term use of corticosteroid therapy in women with a history of, or risk factors for, nontraumatic (frailty) fractures.  Current use of aminoglutethimide (usually for the treatment of Cushing's syndrome) because aminoglutethimide may increase metabolism of progestins    Patient's last menstrual period was 02/28/2020 (exact date). Last sex: 02/24/20 BCM: none Pt desires EC? N/a - recent period  Last pap: 03/08/2017: result "negative," Unclear HPV status, though pt believes she was told she needed repeat in 3 years.  Last breast exam: 3 yrs ago  Patient reports 1 partner(s) in last year. Do they desire STI screening (if no, why not)? Yes. Endorses incr vaginal discharge.  Does the patient desire a pregnancy in the next year? no   39 y.o., Body  mass index is 28.12 kg/m. - Is patient eligible for HA1C diabetes screening based on BMI and age >61?  no  Has patient been screened once for HCV in the past?  no  No results found for: HCVAB  Does the patient have current of drug use, have a partner with drug use, and/or has been incarcerated since last result? no If yes-- Screen for HCV through [facility]   Does the patient meet criteria for HBV testing? no  Criteria:  -Household, sexual or needle sharing contact with HBV -History of drug use -HIV positive -Those with known Hep C  See flowsheet for other program required questions.   Health Maintenance Due  Topic Date Due  . Hepatitis C Screening  Never done  . COVID-19 Vaccine (1) Never done  . PAP SMEAR-Modifier  03/08/2018    ROS  Negative aside from HPI  The following portions of the patient's history were reviewed and updated as appropriate: allergies, current medications, past family history, past medical history, past social history, past surgical history and problem list. Problem list updated.  Objective:   Vitals:   03/04/20 1328  BP: 121/79  Weight: 169 lb (76.7 kg)  Height: 5\' 5"  (1.651 m)     Physical Exam Vitals and nursing note reviewed.  Constitutional:      Appearance: Normal appearance.  HENT:     Head: Normocephalic and atraumatic.     Mouth/Throat:     Mouth: Mucous membranes are moist.     Pharynx: Oropharynx is clear. No oropharyngeal exudate or posterior oropharyngeal erythema.  Pulmonary:     Effort: Pulmonary effort is normal.  Chest:  Breasts:        Right: Mass (1cm mobile mass 12:00 outer edge areola) and skin change (posterior to areola, s/p breast reduction surgery) present. No swelling, bleeding, inverted nipple, nipple discharge or tenderness.        Left: Normal. No swelling, bleeding, inverted nipple, mass, nipple discharge, skin change or tenderness.  Abdominal:     General: Abdomen is flat.     Palpations: There is no  mass.     Tenderness: There is no abdominal tenderness. There is no rebound.  Genitourinary:    General: Normal vulva.     Exam position: Lithotomy position.     Pubic Area: No rash or pubic lice.      Labia:        Right: No rash or lesion.        Left: No rash or lesion.      Vagina: Vaginal discharge (scant, ph>4.5) present. No erythema or lesions.     Cervix: Cervical bleeding (minimal blood in os) present. No cervical motion tenderness, discharge, friability, lesion or erythema.     Uterus: Normal.      Adnexa: Right adnexa normal and left adnexa normal.     Rectum: Normal.  Lymphadenopathy:     Head:     Right side of head: No preauricular or posterior auricular adenopathy.     Left side of head: No preauricular or posterior auricular adenopathy.     Cervical: No cervical adenopathy.     Upper Body:     Right upper body: No supraclavicular or axillary adenopathy.     Left upper body: No supraclavicular or axillary adenopathy.     Lower Body: No right inguinal adenopathy. No left inguinal adenopathy.  Skin:    General: Skin is warm and dry.     Findings: No rash.  Neurological:     Mental Status: She is alert and oriented to person, place, and time.       Assessment and Plan:  Rachel Stevens is a 39 y.o. female presenting to the [facility] Department for a well woman exam/family planning visit  Contraception counseling: Reviewed all forms of birth control options in the tiered based approach. available including abstinence; over the counter/barrier methods; hormonal contraceptive medication including pill, patch, ring, injection,contraceptive implant, ECP; hormonal and nonhormonal IUDs; permanent sterilization options including vasectomy and the various tubal sterilization modalities. Risks, benefits, and typical effectiveness rates were reviewed.  Questions were answered.  Written information was also given to the patient to review.  Patient desires depo, this was  prescribed for patient. She will follow up in  3 months for surveillance.  She was told to call with any further questions, or with any concerns about this method of contraception.  Emphasized use of condoms 100% of the time for STI prevention.  Emergency Contraception: n/a d/t recent menses  1. Family planning services -BCM: rx depo x1 yr. Counseling as above -Pap: done today -CBE: done today. "Active FYIs" info up to date. Recommended screening mammograms beginning at age 69 - IGP, Aptima HPV - medroxyPROGESTERone (DEPO-PROVERA) injection 150 mg  2. Screening examination for venereal disease -Pt with symptoms. Screenings today as below. Treat wet prep per standing order. -Patient does not meet criteria for HepB, HepC Screening.  -Counseled on warning s/sx and when to seek care. Recommended condom use with all sex and discussed importance of condom use for STI prevention. - WET PREP FOR TRICH, YEAST, CLUE - Chlamydia/Gonorrhea   Lab - HIV [place] [facility] - Syphilis Serology, [place] [facility]  3. Breast mass in female -Mass is likely scar tissue from breast reduction surgery, but as pt is unsure how long it has been present will get diagnostic mammo. - MM Digital Diagnostic Unilat R; Future  4. BV (bacterial vaginosis) -Will treat for BV as below: - metroNIDAZOLE (FLAGYL) 500 MG tablet; Take 1 tablet (500 mg total) by mouth 2 (two) times daily for 7 days.  Dispense: 14 tablet; Refill: 0   Return in about 11 weeks (around 05/20/2020) for  depo and completed yearly physical paperwork/updates.  No future appointments.  Ann Held, PA-C

## 2020-03-04 NOTE — Progress Notes (Signed)
Pt requesting BV tx; provider reviewed wet mount results; Pt treated for BV per provider orders. Pt to RTC when next depo is due to complete yearly physical paperwork and updates.

## 2020-03-05 ENCOUNTER — Other Ambulatory Visit: Payer: Self-pay | Admitting: Family Medicine

## 2020-03-05 ENCOUNTER — Other Ambulatory Visit: Payer: Self-pay | Admitting: Physician Assistant

## 2020-03-05 DIAGNOSIS — B9689 Other specified bacterial agents as the cause of diseases classified elsewhere: Secondary | ICD-10-CM

## 2020-03-05 MED ORDER — METRONIDAZOLE 0.75 % VA GEL: 1.0000 | Freq: Every day | VAGINAL | 0 refills | Status: AC

## 2020-03-05 NOTE — Telephone Encounter (Signed)
Message forwarded to me by RN.  Reviewed and seen Rx to requested pharmacy.  Attempted to call patient to let her know that Rx has been sent, but unable to leave message on voicemail.

## 2020-03-05 NOTE — Telephone Encounter (Signed)
Call from patient today at 12:42pm.  Counseled patient that I sent in her Rx to [facility] for the gel as she requested.  Reviewed with patient how to use the gel and that she should get an OTC antifungal cream to use if she has itching during or after the use of the antibiotic gel.  Patient verbalizes understanding.

## 2020-03-05 NOTE — Progress Notes (Signed)
Patient seen by other provider on 03/04/2020, and given po med for BV, but called back and requests Rx for Metro-gel be called to pharmacy instead.  Reviewed chart/note of visit and will send Rx for Metro-gel to pharmacy as requested by patient.

## 2020-03-05 NOTE — Telephone Encounter (Signed)
Phone call to pt. Pt states she has not started oral medication (Metronidazole 500 mg tablets for BV) that was dispensed yesterday.  Pt states that even though she was counseled that common side effects of most antibiotics include upset stomach and diarrhea yesterday, she still wanted oral medication; after giving it more thought, pt decided she does not want oral medication and would like vaginal gel for BV. Pt requesting Rx for gel be sent to her pharmacy. Pt counseled that request would be reviewed by provider.  (Pt gave different pharmacy over phone: [facility] [phone])

## 2020-03-06 LAB — IGP, APTIMA HPV
HPV Aptima: NEGATIVE
PAP Smear Comment: 0

## 2020-03-29 HISTORY — PX: ESOPHAGOGASTRODUODENOSCOPY: SHX1529

## 2020-03-30 ENCOUNTER — Encounter (HOSPITAL_COMMUNITY): Payer: Self-pay

## 2020-03-30 ENCOUNTER — Emergency Department (HOSPITAL_COMMUNITY): Payer: BLUE CROSS/BLUE SHIELD

## 2020-03-30 ENCOUNTER — Other Ambulatory Visit: Payer: Self-pay

## 2020-03-30 ENCOUNTER — Emergency Department (HOSPITAL_COMMUNITY)
Admission: EM | Admit: 2020-03-30 | Discharge: 2020-03-30 | Disposition: A | Discharge status: Home / Self Care | Payer: BLUE CROSS/BLUE SHIELD | Attending: Emergency Medicine | Admitting: Emergency Medicine

## 2020-03-30 DIAGNOSIS — R05 Cough: Secondary | ICD-10-CM | POA: Insufficient documentation

## 2020-03-30 DIAGNOSIS — K5900 Constipation, unspecified: Secondary | ICD-10-CM | POA: Insufficient documentation

## 2020-03-30 DIAGNOSIS — Z91040 Latex allergy status: Secondary | ICD-10-CM | POA: Diagnosis not present

## 2020-03-30 DIAGNOSIS — R1084 Generalized abdominal pain: Secondary | ICD-10-CM | POA: Diagnosis present

## 2020-03-30 LAB — CBC
HCT: 36 % (ref 36.0–46.0)
Hemoglobin: 11.6 g/dL — ABNORMAL LOW (ref 12.0–15.0)
MCH: 28.1 pg (ref 26.0–34.0)
MCHC: 32.2 g/dL (ref 30.0–36.0)
MCV: 87.2 fL (ref 80.0–100.0)
Platelets: 310 10*3/uL (ref 150–400)
RBC: 4.13 MIL/uL (ref 3.87–5.11)
RDW: 13.3 % (ref 11.5–15.5)
WBC: 9.3 10*3/uL (ref 4.0–10.5)
nRBC: 0 % (ref 0.0–0.2)

## 2020-03-30 LAB — URINALYSIS, ROUTINE W REFLEX MICROSCOPIC
Bilirubin Urine: NEGATIVE
Glucose, UA: NEGATIVE mg/dL
Hgb urine dipstick: NEGATIVE
Ketones, ur: 5 mg/dL — AB
Leukocytes,Ua: NEGATIVE
Nitrite: NEGATIVE
Protein, ur: NEGATIVE mg/dL
Specific Gravity, Urine: 1.006 (ref 1.005–1.030)
pH: 7 (ref 5.0–8.0)

## 2020-03-30 LAB — I-STAT BETA HCG BLOOD, ED (MC, WL, AP ONLY): I-stat hCG, quantitative: <5 m[IU]/mL (ref <5)

## 2020-03-30 LAB — COMPREHENSIVE METABOLIC PANEL
ALT: 20 U/L (ref 0–44)
AST: 22 U/L (ref 15–41)
Albumin: 3.8 g/dL (ref 3.5–5.0)
Alkaline Phosphatase: 71 U/L (ref 38–126)
Anion gap: 9 (ref 5–15)
BUN: 5 mg/dL — ABNORMAL LOW (ref 6–20)
CO2: 22 mmol/L (ref 22–32)
Calcium: 9.1 mg/dL (ref 8.9–10.3)
Chloride: 107 mmol/L (ref 98–111)
Creatinine, Ser: 0.67 mg/dL (ref 0.44–1.00)
GFR calc Af Amer: >60 mL/min (ref >60)
GFR calc non Af Amer: >60 mL/min (ref >60)
Glucose, Bld: 101 mg/dL — ABNORMAL HIGH (ref 70–99)
Potassium: 3.7 mmol/L (ref 3.5–5.1)
Sodium: 138 mmol/L (ref 135–145)
Total Bilirubin: 0.6 mg/dL (ref 0.3–1.2)
Total Protein: 7.3 g/dL (ref 6.5–8.1)

## 2020-03-30 LAB — LIPASE, BLOOD: Lipase: 28 U/L (ref 11–51)

## 2020-03-30 MED ORDER — SODIUM CHLORIDE 0.9% FLUSH: 3.0000 mL | Freq: Once | INTRAVENOUS | Status: DC

## 2020-03-30 MED ORDER — IOHEXOL 300 MG/ML  SOLN
100.0000 mL | Freq: Once | INTRAMUSCULAR | Status: AC | PRN
  Administered 2020-03-30: 100 mL via INTRAVENOUS

## 2020-03-30 NOTE — ED Triage Notes (Signed)
Patient complains of ongoing epigastric pain since may, had endo and taking reflux meds with no relief. Pain and burning with no nausea, no vomiting

## 2020-03-30 NOTE — Discharge Instructions (Signed)
Please follow up with PCP/GI. Would consider outpatient colonoscopy, H pylori testing.

## 2020-03-30 NOTE — ED Notes (Signed)
Patient verbalizes understanding of discharge instructions. Opportunity for questioning and answers were provided. Armband removed by staff, pt discharged from ED. Pt. ambulatory and discharged home.  

## 2020-03-30 NOTE — ED Notes (Signed)
Lower abd pain since may   She is on birth control  No period since may also  No nausea vomiting or diarrhea

## 2020-03-30 NOTE — ED Provider Notes (Signed)
[facility] EMERGENCY DEPARTMENT Provider Note   CSN: [id number] Arrival date & time: 03/30/20  1657     History No chief complaint on file.   Rachel Stevens is a 39 y.o. female.  The history is provided by the patient.  Abdominal Pain Pain location:  Generalized (Pt with generalized abdominal pain. Pain possibly worse L >R. ) Pain quality: burning   Pain severity:  Moderate Duration: Pt with acute worsening of pain last night however present since may of this year. Timing:  Constant Chronicity:  Recurrent Context: diet changes, eating and recent illness   Context: not previous surgeries, not recent travel, not sick contacts, not suspicious food intake and not trauma   Context comment:  Pt states she has changed diet cut out fatty foods, now eats bland foods such as oatmeal, bananna. recent UTI Worsened by:  Eating Associated symptoms: constipation and cough   Associated symptoms: no chest pain, no diarrhea, no dysuria, no fever, no hematemesis, no hematochezia, no hematuria, no melena, no nausea, no shortness of breath, no sore throat, no vaginal bleeding, no vaginal discharge and no vomiting   Associated symptoms comment:  Pt endorsews weight loss of ~30 lbs since march due to diet change.  Risk factors: no NSAID use    Pt states recently underwent EGD with GI outpatient wo significant findings. Patient reports taking omeprazole for underlying GERD. Also reports current nitrofurantoin use for UTI dx on Wednesday. Pt endorses some constipation with BM ~ every 2 days.      Past Medical History:  Diagnosis Date  . Chronic pelvic pain in female   . Herpes   . 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 Irregular periods 01/31/2015  . Missed period 11/01/2014  . Patient desires pregnancy 11/01/2014  . Pelvic floor tension   . Vaginal discharge 11/01/2014  . Vulvar pain   . Weight gain 11/01/2014    Patient Active Problem List   Diagnosis Date Noted  .  Breast mass in female 03/04/2020  . Elevated blood pressure reading 02/21/2020  . Obesity BMI=30.9 07/05/2019  . Pelvic "burning" x3 years 07/05/2019  . History of reduction surgery of right breast 2009 07/05/2019  . Depression 07/05/2015  . Dizziness of unknown cause 07/05/2015  . Fatigue 07/05/2015  . Vitamin B12 deficiency 07/05/2015  . Vitamin D insufficiency 07/05/2015  . Vulvar pain 07/05/2015  . Irregular periods 01/31/2015  . 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 Female infertility 07/15/2011    Past Surgical History:  Procedure Laterality Date  . REDUCTION MAMMAPLASTY Right      OB History    Gravida  0   Para      Term      Preterm      AB      Living        SAB      TAB      Ectopic      Multiple      Live Births              Family History  Problem Relation Age of Onset  . Hypertension Mother   . Heart disease Mother   . Cancer Father        throat  . Pancreatic cancer Father   . Congestive Heart Failure Brother   . Heart disease Maternal Grandmother   . Hypertension Maternal Grandmother   . Cancer Maternal Grandfather   . Hypertension Maternal Grandfather   .  Cancer Paternal Grandmother        breast  . Breast cancer Maternal Aunt     Social History   Tobacco Use  . Smoking status: Never Smoker  . Smokeless tobacco: Never Used  Vaping Use  . Vaping Use: Never used  Substance Use Topics  . Alcohol use: No  . Drug use: No    Home Medications Prior to Admission medications   Medication Sig Start Date End Date Taking? Authorizing Provider  pantoprazole (PROTONIX) 20 MG tablet Take 20 mg by mouth daily.    [provider]    Allergies    Bee venom, Latex, and Penicillins  Review of Systems   Review of Systems  Constitutional: Negative for fever.  HENT: Negative for congestion, rhinorrhea and sore throat.   Respiratory: Positive for cough. Negative for shortness of breath.         Non-productive cough   Cardiovascular: Negative for chest pain and leg swelling.  Gastrointestinal: Positive for abdominal pain and constipation. Negative for diarrhea, hematemesis, hematochezia, melena, nausea and vomiting.  Genitourinary: Negative for dysuria, hematuria, vaginal bleeding and vaginal discharge.  Musculoskeletal: Negative for back pain and gait problem.  Skin: Negative for rash and wound.  Neurological: Negative for dizziness and headaches.  Psychiatric/Behavioral: Negative.     Physical Exam Updated Vital Signs BP 128/72 (BP Location: Right Arm)   Pulse 82   Temp 98.7 F (37.1 C) (Oral)   Resp 16   Ht 5\' 5"  (1.651 m)   Wt 76.7 kg   LMP 03/24/2020   SpO2 99%   BMI 28.14 kg/m   Physical Exam Vitals and nursing note reviewed.  Constitutional:      General: She is not in acute distress.    Appearance: She is normal weight. She is not ill-appearing, toxic-appearing or diaphoretic.  HENT:     Head: Normocephalic and atraumatic.     Nose: Nose normal.     Mouth/Throat:     Mouth: Mucous membranes are moist.  Eyes:     Conjunctiva/sclera: Conjunctivae normal.  Cardiovascular:     Rate and Rhythm: Normal rate and regular rhythm.     Heart sounds: Normal heart sounds. No murmur heard.   Pulmonary:     Effort: No respiratory distress.     Breath sounds: Normal breath sounds. No wheezing or rales.  Abdominal:     General: Bowel sounds are normal. There is no distension.     Palpations: Abdomen is soft. There is no mass.     Tenderness: There is no abdominal tenderness. There is no guarding or rebound.     Hernia: No hernia is present.  Musculoskeletal:     Right lower leg: No edema.     Left lower leg: No edema.  Skin:    General: Skin is warm.     Findings: No rash.  Neurological:     Mental Status: She is alert and oriented to person, place, and time. Mental status is at baseline.  Psychiatric:        Mood and Affect: Mood is anxious.     ED  Results / Procedures / Treatments   Labs (all labs ordered are listed, but only abnormal results are displayed) Labs Reviewed  COMPREHENSIVE METABOLIC PANEL - Abnormal; Notable for the following components:      Result Value   Glucose, Bld 101 (*)    BUN 5 (*)    All other components within normal limits  CBC - Abnormal;  Notable for the following components:   Hemoglobin 11.6 (*)    All other components within normal limits  URINALYSIS, ROUTINE W REFLEX MICROSCOPIC - Abnormal; Notable for the following components:   Ketones, ur 5 (*)    All other components within normal limits  LIPASE, BLOOD  I-STAT BETA HCG BLOOD, ED ([facility], [facility], [facility] ONLY)    EKG None  Radiology CT ABDOMEN PELVIS W CONTRAST  Result Date: 03/30/2020 CLINICAL DATA:  39 year old female with abdominal pain. EXAM: CT ABDOMEN AND PELVIS WITH CONTRAST TECHNIQUE: Multidetector CT imaging of the abdomen and pelvis was performed using the standard protocol following bolus administration of intravenous contrast. CONTRAST:  OMNIPAQUE IOHEXOL 300 MG/ML  SOLN COMPARISON:  CT abdomen pelvis dated 12/24/2019. FINDINGS: Lower chest: The visualized lung bases are clear. No intra-abdominal free air. Probable trace free fluid in the pelvis, likely physiologic. Hepatobiliary: No focal liver abnormality is seen. No gallstones, gallbladder wall thickening, or biliary dilatation. Pancreas: Unremarkable. No pancreatic ductal dilatation or surrounding inflammatory changes. Spleen: Normal in size without focal abnormality. Adrenals/Urinary Tract: Adrenal glands are unremarkable. Kidneys are normal, without renal calculi, focal lesion, or hydronephrosis. Bladder is unremarkable. Stomach/Bowel: There is no bowel obstruction or active inflammation. The appendix is normal. Vascular/Lymphatic: The abdominal aorta and IVC unremarkable. No portal venous gas. There is no adenopathy. Reproductive: The uterus is retroverted or retroflexed. No adnexal masses.  Other: None Musculoskeletal: No acute or significant osseous findings. IMPRESSION: No acute intra-abdominal or pelvic pathology. Electronically Signed   By: Elgie Collard M.D.   On: 03/30/2020 21:27    Procedures Procedures (including critical care time)  Medications Ordered in ED Medications  sodium chloride flush (NS) 0.9 % injection 3 mL (has no administration in time range)  iohexol (OMNIPAQUE) 300 MG/ML solution 100 mL (100 mLs Intravenous Contrast Given 03/30/20 2120)    ED Course  I have reviewed the triage vital signs and the nursing notes.  Pertinent labs & imaging results that were available during my care of the patient were reviewed by me and considered in my medical decision making (see chart for details).    MDM Rules/Calculators/A&P                          Rachel Stevens is a 39 y.o. female with significant PMHx of GERD who presented for acute on chronic abdominal pain. Pt describes generalized abdominal pain with some increased pain on L side. Described as burning. Pt also w history of reflex on omeprazole with reassuring EGD. Pt endorses weight loss of ~30 lbs since march after changing diet due to GERD/abdominal pain. On exam pt afebrile, HDS, NAD. On exam pt with normal bs, soft, non-distended abdomen. Pt non-tender to deep palpation, states pain does not change with palpation. No overlying rash.   Labs drawn including CBC, CMP, Lipase, UA. No significant abnormalities noted.  Pt with recent UTI on abx, urine wo evidence of infection today. Doubt Appendicitis as patient with L>R sided abdominal pain, no leukocytosis. Doubt Bowel Obstruction as patient with normal BM for her, no prior abdominal surgeries. Pancreatitis considered however not consistent with history, lipase wnl. Biliary pathology considered however does not have RUQ pain, no elevation of labs on CMP.  Shingles considered however no rash overlying abdomen. Diverticulosis/itis considered however r/o by CT  abdomen/pelvis. CT abdomen/pelvis helps in ruling out emergent intraabdominal/pelvis etiology.   Based on above believe patient is stable for d/c home with outpatient  follow up.   Strict return precautions given, follow up with primary care provider and outpatient GI for possible colonoscopy and H pylori testing. Patient in agreement with plan.  The plan for this patient was discussed with Dr. Zavitz, who voiced agreement and who Jodi Mourningoversaw evaluation and treatment of this patient.   Final Clinical Impression(s) / ED Diagnoses Final diagnoses:  Generalized abdominal pain    Rx / DC Orders ED Discharge Orders    None       Golden PopPapier, Rinnah Peppel, MD 03/30/20 [id number]    Blane OharaZavitz, Joshua, MD 04/01/20 [phone]

## 2020-04-25 ENCOUNTER — Other Ambulatory Visit: Payer: Self-pay | Admitting: Physician Assistant

## 2020-04-26 NOTE — Telephone Encounter (Signed)
Patient with h/o HSV. Last visit was 02/2020, will OK refills for 1 year.

## 2020-05-20 ENCOUNTER — Encounter: Payer: Self-pay | Admitting: Advanced Practice Midwife

## 2020-05-20 ENCOUNTER — Ambulatory Visit: Payer: BLUE CROSS/BLUE SHIELD

## 2020-05-20 ENCOUNTER — Other Ambulatory Visit: Payer: Self-pay

## 2020-05-20 ENCOUNTER — Ambulatory Visit (LOCAL_COMMUNITY_HEALTH_CENTER): Payer: BLUE CROSS/BLUE SHIELD | Admitting: Advanced Practice Midwife

## 2020-05-20 VITALS — BP 132/88 | Ht 65.0 in | Wt 160.0 lb

## 2020-05-20 DIAGNOSIS — Z3042 Encounter for surveillance of injectable contraceptive: Secondary | ICD-10-CM

## 2020-05-20 DIAGNOSIS — Z3009 Encounter for other general counseling and advice on contraception: Secondary | ICD-10-CM | POA: Diagnosis not present

## 2020-05-20 NOTE — Progress Notes (Signed)
Patient here today for Depo. Last RP, Pap Smear and Depo was 03/04/2020 (11.0 weeks.) Patient states "I originally wanted STD testing but I don't want it now." BP today was 132/88. BP retaken 124/88. Ok per provider Hazle Coca, CNM to administer Depo. Depo given per 03/04/2020 order of Maximiano Coss, PA. Tawny Hopping, RN

## 2020-05-21 ENCOUNTER — Telehealth: Payer: Self-pay | Admitting: Family Medicine

## 2020-05-21 NOTE — Telephone Encounter (Signed)
Is pt. being referred to [facility]? A refferal was faxed over. Please call Lupita Leash and let her know.

## 2020-05-21 NOTE — Telephone Encounter (Signed)
After discussing phone call/pt chart/fax with provider, called [facility] Lupita Leash) and asked them to disregard fax and that it was sent through Epic in error.

## 2020-05-23 ENCOUNTER — Other Ambulatory Visit: Payer: Self-pay

## 2020-05-23 NOTE — Telephone Encounter (Signed)
Phone call to pt. Received message that the number has been changed, disconnected, or no longer in service.

## 2020-05-24 NOTE — Telephone Encounter (Signed)
Provider reviewed phone note, and due to entry on note, questioned if this was supposed to be a phone call for [facility] or [facility]. Unable to reach pt to confirm.

## 2020-05-24 NOTE — Telephone Encounter (Signed)
Attempted to return patient phone call to provided contact number. Per phone message "number has been disconnected or is no longer in service." Tawny Hopping, RN

## 2020-05-24 NOTE — Telephone Encounter (Signed)
Phone call to pt. Phone kept ringing and sporadically message stated "Welcome to [employer]" and would start ringing again. No voicemail picked up.

## 2020-05-28 HISTORY — PX: COLONOSCOPY: SHX174

## 2020-07-08 ENCOUNTER — Ambulatory Visit: Payer: Self-pay | Admitting: Physician Assistant

## 2020-07-08 ENCOUNTER — Other Ambulatory Visit: Payer: Self-pay

## 2020-07-08 DIAGNOSIS — Z113 Encounter for screening for infections with a predominantly sexual mode of transmission: Secondary | ICD-10-CM

## 2020-07-08 DIAGNOSIS — Z299 Encounter for prophylactic measures, unspecified: Secondary | ICD-10-CM

## 2020-07-08 LAB — WET PREP FOR TRICH, YEAST, CLUE
Trichomonas Exam: NEGATIVE
Yeast Exam: NEGATIVE

## 2020-07-08 MED ORDER — METRONIDAZOLE 500 MG PO TABS: 2000.0000 mg | ORAL_TABLET | Freq: Once | ORAL | 0 refills | Status: AC

## 2020-07-08 MED ORDER — VALACYCLOVIR HCL 500 MG PO TABS: 500.0000 mg | ORAL_TABLET | Freq: Every day | ORAL | 12 refills | Status: DC

## 2020-07-08 NOTE — Progress Notes (Signed)
Wet Mount results reviewed by provider C. Hampton, PA. Patient treated per provider orders. Odin Mariani, RN  

## 2020-07-10 ENCOUNTER — Encounter: Payer: Self-pay | Admitting: Physician Assistant

## 2020-07-10 NOTE — Progress Notes (Signed)
[facility] Department STI clinic/screening visit  Subjective:  Rachel Stevens is a 39 y.o. female being seen today for an STI screening visit. The patient reports they do have symptoms.  Patient reports that they do not desire a pregnancy in the next year.   They reported they are not interested in discussing contraception today.  No LMP recorded. Patient has had an injection.   Patient has the following medical conditions:   Patient Active Problem List   Diagnosis Date Noted  . Breast mass in female 03/04/2020  . Elevated blood pressure reading 02/21/2020  . Obesity BMI=30.9 07/05/2019  . Pelvic "burning" x3 years 07/05/2019  . History of reduction surgery of right breast 2009 07/05/2019  . Depression 07/05/2015  . Dizziness of unknown cause 07/05/2015  . Fatigue 07/05/2015  . Vitamin B12 deficiency 07/05/2015  . Vitamin D insufficiency 07/05/2015  . Vulvar pain 07/05/2015  . Irregular periods 01/31/2015  . 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 Female infertility 07/15/2011    Chief Complaint  Patient presents with  . SEXUALLY TRANSMITTED DISEASE    screening    HPI  Patient reports that she has had vaginal irritation and odor for 3 days.  Requests Rx be sent to her pharmacy for Valtrex for episodic treatment of HSV.  Reports last HIV test was 2-3 months ago, last pap was in 2020 and that she is using Depo as her BCM.  See flowsheet for further details and programmatic requirements.    The following portions of the patient's history were reviewed and updated as appropriate: allergies, current medications, past medical history, past social history, past surgical history and problem list.  Objective:  There were no vitals filed for this visit.  Physical Exam Constitutional:      General: She is not in acute distress.    Appearance: Normal appearance.  HENT:     Head: Normocephalic and atraumatic.     Comments: No nits,lice, or hair  loss. No cervical, supraclavicular or axillary adenopathy.    Mouth/Throat:     Mouth: Mucous membranes are moist.     Pharynx: Oropharynx is clear. No oropharyngeal exudate or posterior oropharyngeal erythema.  Eyes:     Conjunctiva/sclera: Conjunctivae normal.  Pulmonary:     Effort: Pulmonary effort is normal.  Abdominal:     Palpations: Abdomen is soft. There is no mass.     Tenderness: There is no abdominal tenderness. There is no guarding or rebound.  Genitourinary:    General: Normal vulva.     Rectum: Normal.     Comments: External genitalia/pubic area without nits, lice, edema, erythema, lesions and inguinal adenopathy. Vagina with normal mucosa and small amount of clear discharge, pH=>4.5. Cervix without visible lesions. Uterus firm, mobile, nt, no masses, no CMT, no adnexal tenderness or fullness. Musculoskeletal:     Cervical back: Neck supple. No tenderness.  Skin:    General: Skin is warm and dry.     Findings: No bruising, erythema, lesion or rash.  Neurological:     Mental Status: She is alert and oriented to person, place, and time.  Psychiatric:        Mood and Affect: Mood normal.        Thought Content: Thought content normal.        Judgment: Judgment normal.      Assessment and Plan:  Rachel Stevens is a 39 y.o. female presenting to the [facility] Department for STI screening  1. Screening for STD (sexually transmitted disease) Patient into clinic with symptoms. Rec condoms with all sex. Await test results.  Counseled that RN will call if needs to RTC for treatment once results are back. - WET PREP FOR TRICH, YEAST, CLUE - Chlamydia/Gonorrhea [place] [facility] - HIV [place] [facility] - Syphilis Serology, [place] [facility]  2. Prophylactic measure Will give Metronidazole 2 g po with food, no EtOH for 24 hr before and until 72 hr after taking medicine for symptomatic relief. Rx for Valtrex sent to pharmacy of choice per patient request with refills for  1 year. - valACYclovir (VALTREX) 500 MG tablet; Take 1 tablet (500 mg total) by mouth daily.  Dispense: 30 tablet; Refill: 12 - metroNIDAZOLE (FLAGYL) 500 MG tablet; Take 4 tablets (2,000 mg total) by mouth once for 1 dose.  Dispense: 4 tablet; Refill: 0     No follow-ups on file.  No future appointments.  Matt Holmes, PA

## 2020-08-19 ENCOUNTER — Ambulatory Visit: Payer: BLUE CROSS/BLUE SHIELD

## 2020-08-19 ENCOUNTER — Encounter: Payer: Self-pay | Admitting: Advanced Practice Midwife

## 2020-08-19 ENCOUNTER — Other Ambulatory Visit: Payer: Self-pay

## 2020-08-19 ENCOUNTER — Ambulatory Visit: Payer: BLUE CROSS/BLUE SHIELD | Admitting: Advanced Practice Midwife

## 2020-08-19 DIAGNOSIS — F419 Anxiety disorder, unspecified: Secondary | ICD-10-CM

## 2020-08-19 DIAGNOSIS — B9689 Other specified bacterial agents as the cause of diseases classified elsewhere: Secondary | ICD-10-CM

## 2020-08-19 DIAGNOSIS — N760 Acute vaginitis: Secondary | ICD-10-CM

## 2020-08-19 DIAGNOSIS — Z113 Encounter for screening for infections with a predominantly sexual mode of transmission: Secondary | ICD-10-CM

## 2020-08-19 LAB — WET PREP FOR TRICH, YEAST, CLUE
Trichomonas Exam: NEGATIVE
Yeast Exam: NEGATIVE

## 2020-08-19 MED ORDER — METRONIDAZOLE 0.75 % VA GEL: 1.0000 | Freq: Every day | VAGINAL | 0 refills | Status: AC

## 2020-08-19 MED ORDER — METRONIDAZOLE 500 MG PO TABS: 500.0000 mg | ORAL_TABLET | Freq: Two times a day (BID) | ORAL | 0 refills | Status: DC

## 2020-08-19 NOTE — Progress Notes (Signed)
[facility] Department STI clinic/screening visit  Subjective:  Rachel Stevens is a 39 y.o. SBF G2P0 nonsmoker female being seen today for an STI screening visit. The patient reports they do have symptoms.  Patient reports that they do not desire a pregnancy in the next year.   They reported they are not interested in discussing contraception today.  No LMP recorded. Patient has had an injection. of DMPA 2 days ago at "[facility]"   Patient has the following medical conditions:   Patient Active Problem List   Diagnosis Date Noted  . Breast mass in female 03/04/2020  . Elevated blood pressure reading 02/21/2020  . Obesity BMI=30.9  160 lbs 07/05/2019  . Pelvic "burning" x3 years 07/05/2019  . History of reduction surgery of right breast 2009 07/05/2019  . Depression 07/05/2015  . Dizziness of unknown cause 07/05/2015  . Fatigue 07/05/2015  . Vitamin B12 deficiency 07/05/2015  . Vitamin D insufficiency 07/05/2015  . Vulvar pain 07/05/2015  . Irregular periods 01/31/2015  . 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 Female infertility 07/15/2011    No chief complaint on file.   HPI  Patient reports c/o malodor x3 days and unfaithful partner.  Had DMPA 2 days ago at the "health dept where I live".  LMP ? 2021.  Last sex 08/11/20 without condom; with current partner x 3 years off and on; 1 sex partner in last 3 mo.  Last ETOH>1 year ago.  Last HIV test per patient/review of record was 07/08/20 Patient reports last pap was 03/04/2020 neg HPV neg  See flowsheet for further details and programmatic requirements.    The following portions of the patient's history were reviewed and updated as appropriate: allergies, current medications, past medical history, past social history, past surgical history and problem list.  Objective:  There were no vitals filed for this visit.  Physical Exam Vitals and nursing note reviewed.  Constitutional:       Appearance: Normal appearance.  HENT:     Head: Normocephalic and atraumatic.     Mouth/Throat:     Mouth: Mucous membranes are moist.     Pharynx: Oropharynx is clear. No oropharyngeal exudate or posterior oropharyngeal erythema.  Eyes:     Conjunctiva/sclera: Conjunctivae normal.  Pulmonary:     Effort: Pulmonary effort is normal.  Abdominal:     Palpations: Abdomen is soft. There is no mass.     Tenderness: There is no abdominal tenderness. There is no rebound.     Comments: Soft without masses or tenderness, poor tone  Genitourinary:    General: Normal vulva.     Exam position: Lithotomy position.     Pubic Area: No rash or pubic lice.      Labia:        Right: No rash or lesion.        Left: No rash or lesion.      Vagina: Vaginal discharge (grey leukorrhea, ph inconclusive) present. No erythema, bleeding or lesions.     Cervix: Normal.     Uterus: Normal.      Adnexa: Right adnexa normal and left adnexa normal.     Rectum: Normal.  Lymphadenopathy:     Head:     Right side of head: No preauricular or posterior auricular adenopathy.     Left side of head: No preauricular or posterior auricular adenopathy.     Cervical: No cervical adenopathy.     Upper  Body:     Right upper body: No supraclavicular or axillary adenopathy.     Left upper body: No supraclavicular or axillary adenopathy.     Lower Body: No right inguinal adenopathy. No left inguinal adenopathy.  Skin:    General: Skin is warm and dry.     Findings: No rash.  Neurological:     Mental Status: She is alert and oriented to person, place, and time.      Assessment and Plan:  Rachel Stevens is a 39 y.o. female presenting to the [facility] Department for STI screening  1. Screening examination for venereal disease Treat wet mount per standing orders Immunization nurse consult  - WET PREP FOR TRICH, YEAST, CLUE - Gonococcus culture - HIV/HCV [place] [facility] - Chlamydia/Gonorrhea [place]  Lab - Syphilis Serology, [place] [facility]     Return if symptoms worsen or fail to improve.  No future appointments.  Alberteen Spindle, CNM

## 2020-08-19 NOTE — Progress Notes (Addendum)
Wet mount reviewed with provider and pt received treatment for BV with Metronidazole vaginal 0.75% gel per Hazle Coca, CNM verbal order, as pt states is not able to tolerate Metronidazole tablets. Provider orders completed.

## 2020-08-19 NOTE — Progress Notes (Signed)
Patient here for screening. Reports she has had symptoms of BV since last Saturday. Declines blood work today.   Harvie Heck, RN

## 2020-08-20 ENCOUNTER — Ambulatory Visit: Payer: BLUE CROSS/BLUE SHIELD

## 2020-08-24 LAB — GONOCOCCUS CULTURE

## 2020-08-27 ENCOUNTER — Other Ambulatory Visit: Payer: BLUE CROSS/BLUE SHIELD

## 2020-08-28 LAB — HM HEPATITIS C SCREENING LAB: HM Hepatitis Screen: NEGATIVE

## 2020-08-28 LAB — HM HIV SCREENING LAB: HM HIV Screening: NEGATIVE

## 2020-09-11 ENCOUNTER — Emergency Department (HOSPITAL_COMMUNITY)
Admission: EM | Admit: 2020-09-11 | Discharge: 2020-09-11 | Discharge status: Against Medical Advice | Payer: BLUE CROSS/BLUE SHIELD

## 2020-09-11 ENCOUNTER — Other Ambulatory Visit: Payer: Self-pay

## 2020-09-16 ENCOUNTER — Other Ambulatory Visit: Payer: Self-pay

## 2020-09-16 ENCOUNTER — Ambulatory Visit
Admission: EM | Admit: 2020-09-16 | Discharge: 2020-09-16 | Disposition: A | Discharge status: Home / Self Care | Payer: Self-pay | Attending: Urgent Care | Admitting: Urgent Care

## 2020-09-16 DIAGNOSIS — Z1152 Encounter for screening for COVID-19: Secondary | ICD-10-CM

## 2020-09-16 DIAGNOSIS — H9203 Otalgia, bilateral: Secondary | ICD-10-CM

## 2020-09-16 DIAGNOSIS — J069 Acute upper respiratory infection, unspecified: Secondary | ICD-10-CM

## 2020-09-16 DIAGNOSIS — R519 Headache, unspecified: Secondary | ICD-10-CM

## 2020-09-16 MED ORDER — PSEUDOEPHEDRINE HCL 60 MG PO TABS
60.0000 mg | ORAL_TABLET | Freq: Three times a day (TID) | ORAL | 0 refills | Status: DC | PRN
Start: 2020-09-16 — End: 2021-09-30

## 2020-09-16 MED ORDER — CETIRIZINE HCL 10 MG PO TABS
10.0000 mg | ORAL_TABLET | Freq: Every day | ORAL | 0 refills | Status: DC
Start: 2020-09-16 — End: 2021-09-30

## 2020-09-16 NOTE — Discharge Instructions (Signed)

## 2020-09-16 NOTE — ED Provider Notes (Signed)
[facility]   MRN: [id number] DOB: 15-Apr-1981  Subjective:   Rachel Stevens is a 39 y.o. female presenting for 3 to 5-day history of acute onset sinus headache, sinus congestion, postnasal drainage, intermittent bilateral ear pain, general malaise.  Patient has longstanding history of vertigo, had a video visit and was prescribed meclizine but has not needed to use this yet.  She is Covid vaccinated.  Denies coughing, chest pain, shortness of breath, body aches.   Current Facility-Administered Medications:    medroxyPROGESTERone (DEPO-PROVERA) injection 150 mg, 150 mg, Intramuscular, Q90 days, Staples, Jenna L, PA-C, 150 mg at 05/20/20 0940  Current Outpatient Medications:    desipramine (NORPRAMIN) 25 MG tablet, Take by mouth., Disp: , Rfl:    desipramine (NORPRAMIN) 25 MG tablet, Take by mouth., Disp: , Rfl:    dicyclomine (BENTYL) 10 MG capsule, Take 10 mg by mouth every 6 (six) hours as needed., Disp: , Rfl:    escitalopram (LEXAPRO) 10 MG tablet, , Disp: , Rfl:    pantoprazole (PROTONIX) 20 MG tablet, Take 20 mg by mouth daily. (Patient not taking: Reported on 05/20/2020), Disp: , Rfl:    valACYclovir (VALTREX) 500 MG tablet, TAKE 1 TABLET BY MOUTH ONCE A DAY. (Patient not taking: Reported on 05/20/2020), Disp: 31 tablet, Rfl: 12   valACYclovir (VALTREX) 500 MG tablet, Take 1 tablet (500 mg total) by mouth daily., Disp: 30 tablet, Rfl: 12   Allergies  Allergen Reactions   Bee Venom Other (See Comments)    blisters   Latex Swelling   Penicillins Hives    .Has patient had a PCN reaction causing immediate rash, facial/tongue/throat swelling, SOB or lightheadedness with hypotension: Yes Has patient had a PCN reaction causing severe rash involving mucus membranes or skin necrosis: No Has patient had a PCN reaction that required hospitalization: No Has patient had a PCN reaction occurring within the last 10 years: No If all of the above answers are "NO", then  may proceed with Cephalosporin use.     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   Irregular periods 01/31/2015   Missed period 11/01/2014   Patient desires pregnancy 11/01/2014   Pelvic floor tension    Vaginal discharge 11/01/2014   Vulvar pain    Weight gain 11/01/2014     Past Surgical History:  Procedure Laterality Date   REDUCTION MAMMAPLASTY Right     Family History  Problem Relation Age of Onset   Hypertension Mother    Heart disease Mother    Cancer Father        throat   Pancreatic cancer Father    Congestive Heart Failure Brother    Heart disease Maternal Grandmother    Hypertension Maternal Grandmother    Cancer Maternal Grandfather    Hypertension Maternal Grandfather    Cancer Paternal Grandmother        breast   Breast cancer Maternal Aunt     Social History   Tobacco Use   Smoking status: Never Smoker   Smokeless tobacco: Never Used  Vaping Use   Vaping Use: Never used  Substance Use Topics   Alcohol use: Not Currently   Drug use: No    ROS   Objective:   Vitals: BP 126/76    Pulse 84    Temp 98.2 F (36.8 C)    Resp 18    SpO2 99%  Physical Exam Constitutional:      General: She is not in acute distress.    Appearance: She is well-developed. She is not ill-appearing, toxic-appearing or diaphoretic.  HENT:     Head: Normocephalic and atraumatic.     Right Ear: Tympanic membrane and ear canal normal. No drainage or tenderness. No middle ear effusion. Tympanic membrane is not erythematous.     Left Ear: Tympanic membrane and ear canal normal. No drainage or tenderness.  No middle ear effusion. Tympanic membrane is not erythematous.     Nose: No congestion or rhinorrhea.     Mouth/Throat:     Mouth: Mucous membranes are moist. No oral lesions.     Pharynx: Oropharynx is clear. No pharyngeal swelling, oropharyngeal exudate,  posterior oropharyngeal erythema or uvula swelling.     Tonsils: No tonsillar exudate or tonsillar abscesses.  Eyes:     General: No scleral icterus.       Right eye: No discharge.        Left eye: No discharge.     Extraocular Movements: Extraocular movements intact.     Right eye: Normal extraocular motion.     Left eye: Normal extraocular motion.     Conjunctiva/sclera: Conjunctivae normal.     Pupils: Pupils are equal, round, and reactive to light.  Cardiovascular:     Rate and Rhythm: Normal rate.  Pulmonary:     Effort: Pulmonary effort is normal.  Musculoskeletal:     Cervical back: Normal range of motion and neck supple.  Lymphadenopathy:     Cervical: No cervical adenopathy.  Skin:    General: Skin is warm and dry.  Neurological:     General: No focal deficit present.     Mental Status: She is alert and oriented to person, place, and time.  Psychiatric:        Mood and Affect: Mood normal.        Behavior: Behavior normal.        Thought Content: Thought content normal.        Judgment: Judgment normal.      Assessment and Plan :   PDMP not reviewed this encounter.  1. Viral URI   2. Encounter for screening for COVID-19   3. Generalized headache   4. Acute ear pain, bilateral     Will manage for viral illness such as viral URI, viral syndrome, viral rhinitis, COVID-19. Counseled patient on nature of COVID-19 including modes of transmission, diagnostic testing, management and supportive care.  Offered scripts for symptomatic relief. COVID 19 testing is pending. Counseled patient on potential for adverse effects with medications prescribed/recommended today, ER and return-to-clinic precautions discussed, patient verbalized understanding.     Wallis Bamberg, [place] 09/16/20 1554

## 2020-09-16 NOTE — ED Triage Notes (Signed)
Pt presents with c/o headache and ear ache and feeling unwell since last week

## 2020-09-19 LAB — COVID-19, FLU A+B NAA
Influenza A, NAA: NOT DETECTED
Influenza B, NAA: NOT DETECTED
SARS-CoV-2, NAA: NOT DETECTED

## 2020-10-18 ENCOUNTER — Encounter: Payer: Self-pay | Admitting: Emergency Medicine

## 2020-10-18 ENCOUNTER — Ambulatory Visit
Admission: EM | Admit: 2020-10-18 | Discharge: 2020-10-18 | Disposition: A | Discharge status: Home / Self Care | Payer: BLUE CROSS/BLUE SHIELD | Attending: Family Medicine | Admitting: Family Medicine

## 2020-10-18 ENCOUNTER — Other Ambulatory Visit: Payer: Self-pay

## 2020-10-18 DIAGNOSIS — G43801 Other migraine, not intractable, with status migrainosus: Secondary | ICD-10-CM | POA: Insufficient documentation

## 2020-10-18 DIAGNOSIS — N760 Acute vaginitis: Secondary | ICD-10-CM | POA: Diagnosis not present

## 2020-10-18 MED ORDER — DEXAMETHASONE SODIUM PHOSPHATE 10 MG/ML IJ SOLN
10.0000 mg | Freq: Once | INTRAMUSCULAR | Status: AC
  Administered 2020-10-18: 10 mg via INTRAMUSCULAR

## 2020-10-18 MED ORDER — KETOROLAC TROMETHAMINE 30 MG/ML IJ SOLN
30.0000 mg | Freq: Once | INTRAMUSCULAR | Status: AC
  Administered 2020-10-18: 30 mg via INTRAMUSCULAR

## 2020-10-18 NOTE — ED Triage Notes (Signed)
Headache for 2 days in her eyes

## 2020-10-18 NOTE — Discharge Instructions (Signed)
You have received a toradol injection for pain, as well as a steroid to help with the migraine  Swab testing will be back in about 2 days. Do not have sex until results are back and negative or until treatment is completed, whichever is longer.  Follow up with this office or with primary care if symptoms are persisting.  Follow up in the ER for high fever, trouble swallowing, trouble breathing, other concerning symptoms.

## 2020-10-18 NOTE — ED Provider Notes (Signed)
[facility]   [id number] 10/18/20 Arrival Time: 1041  CC: HEADACHE  SUBJECTIVE:  Rachel Stevens is a 40 y.o. female who complains of headache for 2 days. Denies a precipitating event, or recent head trauma. Patient localizes pain to behind her eyes. Describes the pain as constant and throbbing in character. Patient has tried OTC goody powder yesterday without relief. Symptoms are made worse with light. Denies similar symptoms in the past. This is not the worst headache of their life. Patient denies fever, chills, nausea, vomiting, aura, rhinorrhea, watery eyes, chest pain, SOB, abdominal pain, weakness, numbness or tingling, slurred speech.   Also complains of vaginal burning after intercourse earlier in the week. Reports that she has hx of BV and yeast. Denies vaginal discharge, abnormal vaginal bleeding.     ROS: As per HPI.  All other pertinent ROS negative.     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   Irregular periods 01/31/2015   Missed period 11/01/2014   Patient desires pregnancy 11/01/2014   Pelvic floor tension    Vaginal discharge 11/01/2014   Vulvar pain    Weight gain 11/01/2014   Past Surgical History:  Procedure Laterality Date   REDUCTION MAMMAPLASTY Right    Allergies  Allergen Reactions   Bee Venom Other (See Comments)    blisters   Latex Swelling   Penicillins Hives    .Has patient had a PCN reaction causing immediate rash, facial/tongue/throat swelling, SOB or lightheadedness with hypotension: Yes Has patient had a PCN reaction causing severe rash involving mucus membranes or skin necrosis: No Has patient had a PCN reaction that required hospitalization: No Has patient had a PCN reaction occurring within the last 10 years: No If all of the above answers are "NO", then may proceed with Cephalosporin use.    Current Facility-Administered  Medications on File Prior to Encounter  Medication Dose Route Frequency Provider Last Rate Last Admin   medroxyPROGESTERone (DEPO-PROVERA) injection 150 mg  150 mg Intramuscular Q90 days Staples, Jenna L, PA-C   150 mg at 05/20/20 0940   Current Outpatient Medications on File Prior to Encounter  Medication Sig Dispense Refill   cetirizine (ZYRTEC ALLERGY) 10 MG tablet Take 1 tablet (10 mg total) by mouth daily. 30 tablet 0   desipramine (NORPRAMIN) 25 MG tablet Take by mouth.     desipramine (NORPRAMIN) 25 MG tablet Take by mouth.     dicyclomine (BENTYL) 10 MG capsule Take 10 mg by mouth every 6 (six) hours as needed.     escitalopram (LEXAPRO) 10 MG tablet      pantoprazole (PROTONIX) 20 MG tablet Take 20 mg by mouth daily. (Patient not taking: Reported on 05/20/2020)     pseudoephedrine (SUDAFED) 60 MG tablet Take 1 tablet (60 mg total) by mouth every 8 (eight) hours as needed for congestion. 30 tablet 0   valACYclovir (VALTREX) 500 MG tablet TAKE 1 TABLET BY MOUTH ONCE A DAY. (Patient not taking: Reported on 05/20/2020) 31 tablet 12   valACYclovir (VALTREX) 500 MG tablet Take 1 tablet (500 mg total) by mouth daily. 30 tablet 12   Social History   Socioeconomic History   Marital status: Divorced    Spouse name: Not on file   Number of children: Not on file   Years of education: Not on file   Highest education level: Not on file  Occupational History   Not on file  Tobacco Use   Smoking status: Never Smoker   Smokeless tobacco: Never Used  Vaping Use   Vaping Use: Never used  Substance and Sexual Activity   Alcohol use: Not Currently   Drug use: No   Sexual activity: Yes    Partners: Male    Birth control/protection: Injection  Other Topics Concern   Not on file  Social History Narrative   Not on file   Social Determinants of Health   Financial Resource Strain: Not on file  Food Insecurity: Not on file  Transportation Needs: Not on file  Physical  Activity: Not on file  Stress: Not on file  Social Connections: Not on file  Intimate Partner Violence: Not on file   Family History  Problem Relation Age of Onset   Hypertension Mother    Heart disease Mother    Cancer Father        throat   Pancreatic cancer Father    Congestive Heart Failure Brother    Heart disease Maternal Grandmother    Hypertension Maternal Grandmother    Cancer Maternal Grandfather    Hypertension Maternal Grandfather    Cancer Paternal Grandmother        breast   Breast cancer Maternal Aunt     OBJECTIVE:  Vitals:   10/18/20 1209  BP: 132/85  Pulse: 73  Resp: 16  Temp: 98.3 F (36.8 C)  TempSrc: Oral  SpO2: 98%    General appearance: alert; no distress Eyes: PERRLA; EOMI HENT: normocephalic; atraumatic Neck: supple with FROM Lungs: clear to auscultation bilaterally Heart: regular rate and rhythm.  Radial pulses 2+ symmetrical bilaterally Extremities: no edema; symmetrical with no gross deformities Skin: warm and dry Neurologic: CN 2-12 grossly intact; finger to nose without difficulty; normal gait; strength and sensation intact bilaterally about the upper and lower extremities; negative pronator drift Psychological: alert and cooperative; normal mood and affect   ASSESSMENT & PLAN:  1. Other migraine with status migrainosus, not intractable   2. Acute vaginitis     Meds ordered this encounter  Medications   ketorolac (TORADOL) 30 MG/ML injection 30 mg   dexamethasone (DECADRON) injection 10 mg   If BV positive, prefers Metrogel Cytology self swab obtained in office Will inform of abnormal results and treat accordingly  Migraine cocktail given in office Rest and drink plenty of fluids Use OTC medications as needed for symptomatic relief Follow up with PCP if symptoms persists Return or go to the ER if you have any new or worsening symptoms such as fever, chills, nausea, vomiting, chest pain, shortness of breath,  cough, vision changes, worsening headache despite treatment, slurred speech, facial asymmetry, weakness in arms or legs.  Reviewed expectations re: course of current medical issues. Questions answered. Outlined signs and symptoms indicating need for more acute intervention. Patient verbalized understanding. After Visit Summary given.   Moshe Cipro, NP 10/18/20 1942

## 2020-10-21 LAB — CERVICOVAGINAL ANCILLARY ONLY
Bacterial Vaginitis (gardnerella): NEGATIVE
Candida Glabrata: NEGATIVE
Candida Vaginitis: NEGATIVE
Chlamydia: NEGATIVE
Comment: NEGATIVE
Comment: NEGATIVE
Comment: NEGATIVE
Comment: NEGATIVE
Comment: NEGATIVE
Comment: NORMAL
Neisseria Gonorrhea: NEGATIVE
Trichomonas: NEGATIVE

## 2020-10-23 ENCOUNTER — Emergency Department (HOSPITAL_COMMUNITY)
Admission: EM | Admit: 2020-10-23 | Discharge: 2020-10-23 | Disposition: A | Discharge status: Home / Self Care | Payer: BLUE CROSS/BLUE SHIELD | Attending: Emergency Medicine | Admitting: Emergency Medicine

## 2020-10-23 ENCOUNTER — Emergency Department (HOSPITAL_COMMUNITY): Payer: BLUE CROSS/BLUE SHIELD

## 2020-10-23 ENCOUNTER — Emergency Department (HOSPITAL_COMMUNITY)
Admission: EM | Admit: 2020-10-23 | Discharge: 2020-10-23 | Discharge status: Against Medical Advice | Payer: BLUE CROSS/BLUE SHIELD

## 2020-10-23 ENCOUNTER — Other Ambulatory Visit: Payer: Self-pay

## 2020-10-23 ENCOUNTER — Encounter (HOSPITAL_COMMUNITY): Payer: Self-pay

## 2020-10-23 DIAGNOSIS — R519 Headache, unspecified: Secondary | ICD-10-CM | POA: Diagnosis not present

## 2020-10-23 DIAGNOSIS — Z91040 Latex allergy status: Secondary | ICD-10-CM | POA: Insufficient documentation

## 2020-10-23 LAB — COMPREHENSIVE METABOLIC PANEL
ALT: 18 U/L (ref 0–44)
AST: 17 U/L (ref 15–41)
Albumin: 4 g/dL (ref 3.5–5.0)
Alkaline Phosphatase: 66 U/L (ref 38–126)
Anion gap: 6 (ref 5–15)
BUN: 8 mg/dL (ref 6–20)
CO2: 24 mmol/L (ref 22–32)
Calcium: 9 mg/dL (ref 8.9–10.3)
Chloride: 106 mmol/L (ref 98–111)
Creatinine, Ser: 0.79 mg/dL (ref 0.44–1.00)
GFR, Estimated: >60 mL/min (ref >60)
Glucose, Bld: 90 mg/dL (ref 70–99)
Potassium: 3.6 mmol/L (ref 3.5–5.1)
Sodium: 136 mmol/L (ref 135–145)
Total Bilirubin: 0.5 mg/dL (ref 0.3–1.2)
Total Protein: 7.7 g/dL (ref 6.5–8.1)

## 2020-10-23 LAB — CBC WITH DIFFERENTIAL/PLATELET
Abs Immature Granulocytes: 0.06 10*3/uL (ref 0.00–0.07)
Basophils Absolute: 0 10*3/uL (ref 0.0–0.1)
Basophils Relative: 0 %
Eosinophils Absolute: 0.1 10*3/uL (ref 0.0–0.5)
Eosinophils Relative: 1 %
HCT: 39.6 % (ref 36.0–46.0)
Hemoglobin: 12.8 g/dL (ref 12.0–15.0)
Immature Granulocytes: 1 %
Lymphocytes Relative: 25 %
Lymphs Abs: 2.7 10*3/uL (ref 0.7–4.0)
MCH: 29 pg (ref 26.0–34.0)
MCHC: 32.3 g/dL (ref 30.0–36.0)
MCV: 89.8 fL (ref 80.0–100.0)
Monocytes Absolute: 0.7 10*3/uL (ref 0.1–1.0)
Monocytes Relative: 7 %
Neutro Abs: 7 10*3/uL (ref 1.7–7.7)
Neutrophils Relative %: 66 %
Platelets: 248 10*3/uL (ref 150–400)
RBC: 4.41 MIL/uL (ref 3.87–5.11)
RDW: 13.3 % (ref 11.5–15.5)
WBC: 10.6 10*3/uL — ABNORMAL HIGH (ref 4.0–10.5)
nRBC: 0 % (ref 0.0–0.2)

## 2020-10-23 LAB — PREGNANCY, URINE: Preg Test, Ur: NEGATIVE

## 2020-10-23 MED ORDER — METOCLOPRAMIDE HCL 5 MG/ML IJ SOLN
10.0000 mg | Freq: Once | INTRAMUSCULAR | Status: AC
  Administered 2020-10-23: 10 mg via INTRAVENOUS
  Filled 2020-10-23: qty 2

## 2020-10-23 MED ORDER — SODIUM CHLORIDE 0.9 % IV BOLUS
1000.0000 mL | Freq: Once | INTRAVENOUS | Status: AC
  Administered 2020-10-23: 1000 mL via INTRAVENOUS

## 2020-10-23 MED ORDER — DIPHENHYDRAMINE HCL 50 MG/ML IJ SOLN
25.0000 mg | Freq: Once | INTRAMUSCULAR | Status: AC
  Administered 2020-10-23: 25 mg via INTRAVENOUS
  Filled 2020-10-23: qty 1

## 2020-10-23 MED ORDER — KETOROLAC TROMETHAMINE 15 MG/ML IJ SOLN
15.0000 mg | Freq: Once | INTRAMUSCULAR | Status: AC
  Administered 2020-10-23: 15 mg via INTRAVENOUS
  Filled 2020-10-23: qty 1

## 2020-10-23 MED ORDER — IOHEXOL 350 MG/ML SOLN
75.0000 mL | Freq: Once | INTRAVENOUS | Status: AC | PRN
  Administered 2020-10-23: 75 mL via INTRAVENOUS

## 2020-10-23 NOTE — ED Provider Notes (Signed)
[facility] EMERGENCY DEPARTMENT Provider Note   CSN: [id number] Arrival date & time: 10/23/20  0736     History Chief Complaint  Patient presents with  . Headache    Rachel Stevens is a 40 y.o. female.  HPI 40 year old female presents with headache. She has been having a headache since onset on 1/17. Woke up with a severe headache. Headache has been better since but still moderate in intensity, about a 7 out of 10. It has persisted. Went to urgent care and was given Toradol and Decadron without relief. The headache is mostly on the top of her head. Constant sensation. She has been having dizziness for years that seems to all of a sudden occur, usually every couple weeks. However seems to be more often now. She is also having photophobia. This seems to happen along with the dizziness randomly, often when she is driving. No focal weakness or numbness. No neck pain or stiffness. No fevers.  Past Medical History:  Diagnosis Date  . Chronic pelvic pain in female   . Herpes   . 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 Irregular periods 01/31/2015  . Missed period 11/01/2014  . Patient desires pregnancy 11/01/2014  . Pelvic floor tension   . Vaginal discharge 11/01/2014  . Vulvar pain   . Weight gain 11/01/2014    Patient Active Problem List   Diagnosis Date Noted  . Anxiety 08/19/2020  . Breast mass in female 03/04/2020  . Elevated blood pressure reading 02/21/2020  . Obesity BMI=30.9  160 lbs 07/05/2019  . Pelvic "burning" x3 years 07/05/2019  . History of reduction surgery of right breast 2009 07/05/2019  . Depression 07/05/2015  . Dizziness of unknown cause 07/05/2015  . Fatigue 07/05/2015  . Vitamin B12 deficiency 07/05/2015  . Vitamin D insufficiency 07/05/2015  . Vulvar pain 07/05/2015  . Irregular periods 01/31/2015  . 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 Female infertility 07/15/2011    Past Surgical History:  Procedure Laterality Date  .  REDUCTION MAMMAPLASTY Right      OB History    Gravida  0   Para      Term      Preterm      AB      Living        SAB      IAB      Ectopic      Multiple      Live Births              Family History  Problem Relation Age of Onset  . Hypertension Mother   . Heart disease Mother   . Cancer Father        throat  . Pancreatic cancer Father   . Congestive Heart Failure Brother   . Heart disease Maternal Grandmother   . Hypertension Maternal Grandmother   . Cancer Maternal Grandfather   . Hypertension Maternal Grandfather   . Cancer Paternal Grandmother        breast  . Breast cancer Maternal Aunt     Social History   Tobacco Use  . Smoking status: Never Smoker  . Smokeless tobacco: Never Used  Vaping Use  . Vaping Use: Never used  Substance Use Topics  . Alcohol use: Not Currently  . Drug use: No    Home Medications Prior to Admission medications   Medication Sig Start Date End Date Taking? Authorizing Provider  cetirizine (ZYRTEC ALLERGY) 10 MG tablet  Take 1 tablet (10 mg total) by mouth daily. 09/16/20   Wallis BambergMani, Mario, PA-C  desipramine (NORPRAMIN) 25 MG tablet Take by mouth. 07/04/20 07/04/21  [provider]  desipramine (NORPRAMIN) 25 MG tablet Take by mouth. 07/04/20   [provider]  dicyclomine (BENTYL) 10 MG capsule Take 10 mg by mouth every 6 (six) hours as needed. 03/14/20   [provider]  escitalopram (LEXAPRO) 10 MG tablet  04/18/20   [provider]  pantoprazole (PROTONIX) 20 MG tablet Take 20 mg by mouth daily. Patient not taking: Reported on 05/20/2020    [provider]  pseudoephedrine (SUDAFED) 60 MG tablet Take 1 tablet (60 mg total) by mouth every 8 (eight) hours as needed for congestion. 09/16/20   Wallis BambergMani, Mario, PA-C  valACYclovir (VALTREX) 500 MG tablet TAKE 1 TABLET BY MOUTH ONCE A DAY. Patient not taking: Reported on 05/20/2020 04/26/20   Matt HolmesHampton, Carla J, PA  valACYclovir (VALTREX)  500 MG tablet Take 1 tablet (500 mg total) by mouth daily. 07/08/20   Matt HolmesHampton, Carla J, PA    Allergies    Bee venom, Latex, and Penicillins  Review of Systems   Review of Systems  Constitutional: Negative for fever.  Eyes: Positive for photophobia. Negative for visual disturbance.  Gastrointestinal: Negative for vomiting.  Musculoskeletal: Negative for neck pain.  Neurological: Positive for dizziness and headaches. Negative for weakness and numbness.  All other systems reviewed and are negative.   Physical Exam Updated Vital Signs BP 114/76   Pulse 87   Temp 98.1 F (36.7 C) (Oral)   Resp 18   SpO2 100%   Physical Exam Vitals and nursing note reviewed.  Constitutional:      Appearance: She is well-developed and well-nourished.  HENT:     Head: Normocephalic and atraumatic.     Right Ear: External ear normal.     Left Ear: External ear normal.     Nose: Nose normal.  Eyes:     General:        Right eye: No discharge.        Left eye: No discharge.     Extraocular Movements: Extraocular movements intact.     Pupils: Pupils are equal, round, and reactive to light.  Cardiovascular:     Rate and Rhythm: Normal rate and regular rhythm.     Heart sounds: Normal heart sounds.  Pulmonary:     Effort: Pulmonary effort is normal.     Breath sounds: Normal breath sounds.  Abdominal:     Palpations: Abdomen is soft.     Tenderness: There is no abdominal tenderness.  Musculoskeletal:     Cervical back: Normal range of motion and neck supple. No rigidity.  Skin:    General: Skin is warm and dry.  Neurological:     Mental Status: She is alert.     Comments: CN 3-12 grossly intact. 5/5 strength in all 4 extremities. Grossly normal sensation. Normal finger to nose.   Psychiatric:        Mood and Affect: Mood is not anxious.     ED Results / Procedures / Treatments   Labs (all labs ordered are listed, but only abnormal results are displayed) Labs Reviewed  CBC WITH  DIFFERENTIAL/PLATELET - Abnormal; Notable for the following components:      Result Value   WBC 10.6 (*)    All other components within normal limits  COMPREHENSIVE METABOLIC PANEL  PREGNANCY, URINE    EKG None  Radiology  CT Angio Head W or Wo Contrast  Result Date: 10/23/2020 CLINICAL DATA:  Vertebral artery dissection. Additional history provided: Patient reports headache, dizziness, nausea. EXAM: CT ANGIOGRAPHY HEAD AND NECK TECHNIQUE: Multidetector CT imaging of the head and neck was performed using the standard protocol during bolus administration of intravenous contrast. Multiplanar CT image reconstructions and MIPs were obtained to evaluate the vascular anatomy. Carotid stenosis measurements (when applicable) are obtained utilizing NASCET criteria, using the distal internal carotid diameter as the denominator. CONTRAST:  70mL OMNIPAQUE IOHEXOL 350 MG/ML SOLN COMPARISON:  Prior head CT examinations 09/29/2018 and earlier. FINDINGS: CT HEAD FINDINGS Brain: Cerebral volume is normal. There is no acute intracranial hemorrhage. No demarcated cortical infarct. No extra-axial fluid collection. No evidence of intracranial mass. No midline shift. Vascular: No hyperdense vessel. Skull: Normal. Negative for fracture or focal lesion. Sinuses: No significant paranasal sinus disease. Orbits: No mass or acute finding. Review of the MIP images confirms the above findings CTA NECK FINDINGS Aortic arch: Standard aortic branching. The visualized aortic arch is unremarkable. No hemodynamically significant innominate or proximal subclavian artery stenosis. Right carotid system: CCA and ICA patent within the neck without stenosis. Left carotid system: CCA and ICA patent within the neck without stenosis. Vertebral arteries: Patent within the neck. Streak artifact from a dense right-sided contrast bolus partially obscures the V1 right vertebral artery. Within this limitation, there is no stenosis within these vessels.  Skeleton: No acute bony abnormality or aggressive osseous lesion. Nonspecific reversal of the expected cervical lordosis. Other neck: No neck mass or cervical lymphadenopathy. Thyroid unremarkable. Upper chest: No consolidation within the imaged lung apices. Review of the MIP images confirms the above findings CTA HEAD FINDINGS Anterior circulation: The intracranial internal carotid arteries are patent. The M1 middle cerebral arteries are patent. No M2 proximal branch occlusion or high-grade proximal stenosis is identified. The anterior cerebral arteries are patent. No intracranial aneurysm is identified. Posterior circulation: The intracranial vertebral arteries are patent. The basilar artery is patent. The posterior cerebral arteries are patent. A right posterior communicating artery is present. The left posterior communicating artery is hypoplastic or absent. Venous sinuses: Within the limitations of contrast timing, no convincing thrombus. Anatomic variants: As described Review of the MIP images confirms the above findings IMPRESSION: CT head: Unremarkable non-contrast CT appearance of the brain. No evidence of acute intracranial abnormality. CTA neck: 1. Vertebral arteries patent within the neck. Streak artifact from a dense right-sided contrast bolus partially obscures the V1 right vertebral artery. Within this limitation, there is no vertebral artery stenosis or dissection within the neck. 2. The bilateral common and internal carotid arteries are patent within the neck without stenosis. CTA head: Unremarkable exam. No intracranial large vessel occlusion or proximal high-grade arterial stenosis. Electronically Signed   By: Jackey Loge DO   On: 10/23/2020 13:32   CT Angio Neck W and/or Wo Contrast  Result Date: 10/23/2020 CLINICAL DATA:  Vertebral artery dissection. Additional history provided: Patient reports headache, dizziness, nausea. EXAM: CT ANGIOGRAPHY HEAD AND NECK TECHNIQUE: Multidetector CT  imaging of the head and neck was performed using the standard protocol during bolus administration of intravenous contrast. Multiplanar CT image reconstructions and MIPs were obtained to evaluate the vascular anatomy. Carotid stenosis measurements (when applicable) are obtained utilizing NASCET criteria, using the distal internal carotid diameter as the denominator. CONTRAST:  65mL OMNIPAQUE IOHEXOL 350 MG/ML SOLN COMPARISON:  Prior head CT examinations 09/29/2018 and earlier. FINDINGS: CT HEAD FINDINGS Brain: Cerebral volume is normal. There is  no acute intracranial hemorrhage. No demarcated cortical infarct. No extra-axial fluid collection. No evidence of intracranial mass. No midline shift. Vascular: No hyperdense vessel. Skull: Normal. Negative for fracture or focal lesion. Sinuses: No significant paranasal sinus disease. Orbits: No mass or acute finding. Review of the MIP images confirms the above findings CTA NECK FINDINGS Aortic arch: Standard aortic branching. The visualized aortic arch is unremarkable. No hemodynamically significant innominate or proximal subclavian artery stenosis. Right carotid system: CCA and ICA patent within the neck without stenosis. Left carotid system: CCA and ICA patent within the neck without stenosis. Vertebral arteries: Patent within the neck. Streak artifact from a dense right-sided contrast bolus partially obscures the V1 right vertebral artery. Within this limitation, there is no stenosis within these vessels. Skeleton: No acute bony abnormality or aggressive osseous lesion. Nonspecific reversal of the expected cervical lordosis. Other neck: No neck mass or cervical lymphadenopathy. Thyroid unremarkable. Upper chest: No consolidation within the imaged lung apices. Review of the MIP images confirms the above findings CTA HEAD FINDINGS Anterior circulation: The intracranial internal carotid arteries are patent. The M1 middle cerebral arteries are patent. No M2 proximal branch  occlusion or high-grade proximal stenosis is identified. The anterior cerebral arteries are patent. No intracranial aneurysm is identified. Posterior circulation: The intracranial vertebral arteries are patent. The basilar artery is patent. The posterior cerebral arteries are patent. A right posterior communicating artery is present. The left posterior communicating artery is hypoplastic or absent. Venous sinuses: Within the limitations of contrast timing, no convincing thrombus. Anatomic variants: As described Review of the MIP images confirms the above findings IMPRESSION: CT head: Unremarkable non-contrast CT appearance of the brain. No evidence of acute intracranial abnormality. CTA neck: 1. Vertebral arteries patent within the neck. Streak artifact from a dense right-sided contrast bolus partially obscures the V1 right vertebral artery. Within this limitation, there is no vertebral artery stenosis or dissection within the neck. 2. The bilateral common and internal carotid arteries are patent within the neck without stenosis. CTA head: Unremarkable exam. No intracranial large vessel occlusion or proximal high-grade arterial stenosis. Electronically Signed   By: Jackey Loge DO   On: 10/23/2020 13:32    Procedures Procedures   Medications Ordered in ED Medications  sodium chloride 0.9 % bolus 1,000 mL (0 mLs Intravenous Stopped 10/23/20 1239)  ketorolac (TORADOL) 15 MG/ML injection 15 mg (15 mg Intravenous Given 10/23/20 1142)  metoCLOPramide (REGLAN) injection 10 mg (10 mg Intravenous Given 10/23/20 1143)  diphenhydrAMINE (BENADRYL) injection 25 mg (25 mg Intravenous Given 10/23/20 1140)  iohexol (OMNIPAQUE) 350 MG/ML injection 75 mL (75 mLs Intravenous Contrast Given 10/23/20 1234)    ED Course  I have reviewed the triage vital signs and the nursing notes.  Pertinent labs & imaging results that were available during my care of the patient were reviewed by me and considered in my medical decision  making (see chart for details).    MDM Rules/Calculators/A&P                          Patient's exam is benign.  No focal neuro deficits.  No fever or meningismus.  Labs are overall unremarkable besides minimal WBC.  I doubt infection as the cause.  Given the dizziness and visual complaints along with headache for over 1 week, CT and CT angiography was obtained.  Overall unremarkable.  She is feeling much better after treatment in the ED.  Unclear exact cause of her headache  but I doubt CNS emergency.  Appears stable for discharge with outpatient neurology follow-up. Final Clinical Impression(s) / ED Diagnoses Final diagnoses:  Generalized headache    Rx / DC Orders ED Discharge Orders         Ordered    Ambulatory referral to Neurology       Comments: An appointment is requested in approximately: 2 weeks   10/23/20 1439           Pricilla Loveless, MD 10/23/20 [id number]

## 2020-10-23 NOTE — Discharge Instructions (Signed)
If you develop continued, recurrent, or worsening headache, fever, neck stiffness, vomiting, blurry or double vision, weakness or numbness in your arms or legs, trouble speaking, or any other new/concerning symptoms then return to the ER for evaluation.  

## 2020-10-23 NOTE — ED Triage Notes (Signed)
Pt reports she was exposed to covid and tested negative at a testing site. Pt reports HA, dizziness, and nausea. Pt seen by urgent care on the 21st and given shot of steroid,

## 2020-10-24 ENCOUNTER — Encounter: Payer: Self-pay | Admitting: Advanced Practice Midwife

## 2020-10-28 ENCOUNTER — Other Ambulatory Visit: Payer: Self-pay | Admitting: Physician Assistant

## 2020-10-28 DIAGNOSIS — B009 Herpesviral infection, unspecified: Secondary | ICD-10-CM

## 2020-10-28 MED ORDER — ACYCLOVIR 800 MG PO TABS: 800.0000 mg | ORAL_TABLET | Freq: Every day | ORAL | 3 refills | Status: DC

## 2020-10-28 NOTE — Progress Notes (Signed)
Call from patient re: changing medicine for treatment of HSV.  States that Rachel Stevens does not feel that the Valtrex is working to prevent or clear up her current outbreak and would like to change to Acyclovir to see it that works better for her.  States since the Valtrex was not working,Rachel Stevens d/c taking it and her last dose was Saturday.  Requests Rx be sent to pharmacy in [place] that is in her chart.  Also, discussed with patient that Rachel Stevens should follow up with her PCP for further evaluation and possibly allergy testing to ID other possible triggers.  Will send Rx for Acyclovir 800 mg #90 1 po daily with refills x 3.

## 2021-01-13 ENCOUNTER — Other Ambulatory Visit: Payer: Self-pay

## 2021-01-13 ENCOUNTER — Ambulatory Visit
Admission: EM | Admit: 2021-01-13 | Discharge: 2021-01-13 | Discharge status: Absent without leave | Payer: BLUE CROSS/BLUE SHIELD

## 2021-02-04 ENCOUNTER — Telehealth: Payer: Self-pay | Admitting: Family Medicine

## 2021-02-04 NOTE — Telephone Encounter (Signed)
Patient called because she needed a Rx refill and an STD screening. Was able to schedule a an appointment for an STD screening and patient requested to leave a note for a nurse Albin Felling) to have her Rx refilled. Thank you!

## 2021-02-05 ENCOUNTER — Telehealth: Payer: Self-pay | Admitting: Family Medicine

## 2021-02-05 NOTE — Telephone Encounter (Signed)
Patient called to reschedule her cancelled STD screening. While on the phone, her mom tested positive for Covid-19. They live together. Patient had tested herself earlier (days?) and was negative. 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 Will need to reschedule. Thank you!

## 2021-02-06 ENCOUNTER — Ambulatory Visit: Payer: Self-pay

## 2021-02-06 NOTE — Telephone Encounter (Signed)
Phone call to pt. Pt states she has been fully vaccinated. She performed home test on her mom and her mother was positive for Covid 02/05/21. Pt states she was negative for Covid yesterday. RN to call pt back again after confirming if recommendations have changed with quarantines, boosters and rescreening guidelines.  (Per Glenna Fellows, RN: Is patient symptomatic?  Should retest 5-6 days after mom's positive. If asymptomatic and fully vaccinated (does not include boosters), pt does not need to reschedule.)  Returned phone call to pt 02/06/21. Left message that her appt is currently scheduled for 02/07/21. Will call her back again about guidelines.  Returned phone call again. Left message on voicemail: RN calling to discuss symptoms/start date for symptoms, vaccination status, etc. Appt is scheduled for 02/07/21.

## 2021-02-06 NOTE — Telephone Encounter (Signed)
Pt states she already has refill - completed. See other phone note about STD appt.

## 2021-02-07 ENCOUNTER — Ambulatory Visit: Payer: Self-pay

## 2021-02-07 NOTE — Telephone Encounter (Signed)
Phone call to pt. Pt states she already cancelled her appt and will call to reschedule for a later date as needed.

## 2021-02-12 ENCOUNTER — Telehealth: Payer: Self-pay | Admitting: Family Medicine

## 2021-02-12 NOTE — Telephone Encounter (Signed)
Need to know when my very first depo shot was

## 2021-02-12 NOTE — Telephone Encounter (Signed)
Pt needed to know the last Depo shot she had.  Informed pt that she was given a Depo injection on 05/20/2020. Berdie Ogren, RN

## 2021-02-17 NOTE — Telephone Encounter (Signed)
Patient wants medication to be switched to liquid form. Pharmacy advised to contact provider. She had spoken with Rachel Stevens through My Chart. Please have someone call her. Thanks

## 2021-02-17 NOTE — Telephone Encounter (Signed)
Pt is requesting liquid form of Acyclovir to be sent to her pharmacy [facility] in [place], [place]). Her pharmacy will do it but new Rx for liquid will need to be sent. Original rx was done 10/28/20.

## 2021-02-18 ENCOUNTER — Other Ambulatory Visit: Payer: Self-pay | Admitting: Physician Assistant

## 2021-02-18 DIAGNOSIS — B009 Herpesviral infection, unspecified: Secondary | ICD-10-CM

## 2021-02-18 MED ORDER — ACYCLOVIR 200 MG/5ML PO SUSP: 800.0000 mg | Freq: Every day | ORAL | 9 refills | Status: DC

## 2021-02-18 NOTE — Progress Notes (Signed)
Patient made phone call and RN spoke to her.  Patient requests that her Rx for Acyclovir be changed to a liquid medicine and sent to her pharmacy.  Rx changed to Acyclovir 800 mg/ 20 ml one time daily with refills for 9 months.

## 2021-02-19 ENCOUNTER — Telehealth: Payer: Self-pay | Admitting: Family Medicine

## 2021-02-19 NOTE — Telephone Encounter (Signed)
Pt wants to speak to Sadie Haber about a prescription that she gave to pt.

## 2021-02-21 ENCOUNTER — Encounter (HOSPITAL_COMMUNITY): Payer: Self-pay | Admitting: *Deleted

## 2021-02-21 ENCOUNTER — Other Ambulatory Visit: Payer: Self-pay

## 2021-02-21 ENCOUNTER — Emergency Department (HOSPITAL_COMMUNITY)
Admission: EM | Admit: 2021-02-21 | Discharge: 2021-02-21 | Disposition: A | Discharge status: Home / Self Care | Payer: BLUE CROSS/BLUE SHIELD | Attending: Emergency Medicine | Admitting: Emergency Medicine

## 2021-02-21 ENCOUNTER — Emergency Department (HOSPITAL_COMMUNITY): Payer: BLUE CROSS/BLUE SHIELD

## 2021-02-21 DIAGNOSIS — Z91040 Latex allergy status: Secondary | ICD-10-CM | POA: Insufficient documentation

## 2021-02-21 DIAGNOSIS — R109 Unspecified abdominal pain: Secondary | ICD-10-CM | POA: Diagnosis present

## 2021-02-21 DIAGNOSIS — K5900 Constipation, unspecified: Secondary | ICD-10-CM | POA: Diagnosis not present

## 2021-02-21 LAB — COMPREHENSIVE METABOLIC PANEL
ALT: 16 U/L (ref 0–44)
AST: 19 U/L (ref 15–41)
Albumin: 3.9 g/dL (ref 3.5–5.0)
Alkaline Phosphatase: 79 U/L (ref 38–126)
Anion gap: 4 — ABNORMAL LOW (ref 5–15)
BUN: 5 mg/dL — ABNORMAL LOW (ref 6–20)
CO2: 28 mmol/L (ref 22–32)
Calcium: 8.6 mg/dL — ABNORMAL LOW (ref 8.9–10.3)
Chloride: 105 mmol/L (ref 98–111)
Creatinine, Ser: 0.71 mg/dL (ref 0.44–1.00)
GFR, Estimated: >60 mL/min (ref >60)
Glucose, Bld: 89 mg/dL (ref 70–99)
Potassium: 3.4 mmol/L — ABNORMAL LOW (ref 3.5–5.1)
Sodium: 137 mmol/L (ref 135–145)
Total Bilirubin: 0.6 mg/dL (ref 0.3–1.2)
Total Protein: 7 g/dL (ref 6.5–8.1)

## 2021-02-21 LAB — CBC WITH DIFFERENTIAL/PLATELET
Abs Immature Granulocytes: 0.04 10*3/uL (ref 0.00–0.07)
Basophils Absolute: 0 10*3/uL (ref 0.0–0.1)
Basophils Relative: 1 %
Eosinophils Absolute: 0.1 10*3/uL (ref 0.0–0.5)
Eosinophils Relative: 1 %
HCT: 36.6 % (ref 36.0–46.0)
Hemoglobin: 12.1 g/dL (ref 12.0–15.0)
Immature Granulocytes: 1 %
Lymphocytes Relative: 36 %
Lymphs Abs: 2.8 10*3/uL (ref 0.7–4.0)
MCH: 29.9 pg (ref 26.0–34.0)
MCHC: 33.1 g/dL (ref 30.0–36.0)
MCV: 90.4 fL (ref 80.0–100.0)
Monocytes Absolute: 0.7 10*3/uL (ref 0.1–1.0)
Monocytes Relative: 9 %
Neutro Abs: 4.2 10*3/uL (ref 1.7–7.7)
Neutrophils Relative %: 52 %
Platelets: 272 10*3/uL (ref 150–400)
RBC: 4.05 MIL/uL (ref 3.87–5.11)
RDW: 12 % (ref 11.5–15.5)
WBC: 7.8 10*3/uL (ref 4.0–10.5)
nRBC: 0 % (ref 0.0–0.2)

## 2021-02-21 LAB — LIPASE, BLOOD: Lipase: 26 U/L (ref 11–51)

## 2021-02-21 LAB — POC URINE PREG, ED: Preg Test, Ur: NEGATIVE

## 2021-02-21 NOTE — ED Provider Notes (Signed)
[facility] EMERGENCY DEPARTMENT Provider Note   CSN: [id number] Arrival date & time: 02/21/21  1401     History Chief Complaint  Patient presents with  . Constipation    Rachel Stevens is a 40 y.o. female who presents to the ED today with complaint of constipation for the past 3 days. Pt reports she has not had a BM in approximately 3 days. She had an appointment with her GI yesterday who advised that she take 8 capfulls of Miralax in 64 ounces of water which she has not done. She attempted 2 capfulls without relief prompting her to come to the ED today. She mentions that she has been dealing with the constipation intermittently since 05/13. She complains of mild abdominal discomfort as well as a fullness feeling. No vomiting. She is unsure if she is still passing gas however does not believe she is. She denies any previous abdominal surgeries. No other complaints at this time.   The history is provided by the patient and medical records.       Past Medical History:  Diagnosis Date  . Chronic pelvic pain in female   . Herpes   . 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 Irregular periods 01/31/2015  . Missed period 11/01/2014  . Patient desires pregnancy 11/01/2014  . Pelvic floor tension   . Vaginal discharge 11/01/2014  . Vulvar pain   . Weight gain 11/01/2014    Patient Active Problem List   Diagnosis Date Noted  . Anxiety 08/19/2020  . Breast mass in female 03/04/2020  . Elevated blood pressure reading 02/21/2020  . Obesity BMI=30.9  160 lbs 07/05/2019  . Pelvic "burning" x3 years 07/05/2019  . History of reduction surgery of right breast 2009 07/05/2019  . Depression 07/05/2015  . Dizziness of unknown cause 07/05/2015  . Fatigue 07/05/2015  . Vitamin B12 deficiency 07/05/2015  . Vitamin D insufficiency 07/05/2015  . Vulvar pain 07/05/2015  . Irregular periods 01/31/2015  . 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 Female infertility 07/15/2011     Past Surgical History:  Procedure Laterality Date  . REDUCTION MAMMAPLASTY Right      OB History    Gravida  0   Para      Term      Preterm      AB      Living        SAB      IAB      Ectopic      Multiple      Live Births              Family History  Problem Relation Age of Onset  . Hypertension Mother   . Heart disease Mother   . Cancer Father        throat  . Pancreatic cancer Father   . Congestive Heart Failure Brother   . Heart disease Maternal Grandmother   . Hypertension Maternal Grandmother   . Cancer Maternal Grandfather   . Hypertension Maternal Grandfather   . Cancer Paternal Grandmother        breast  . Breast cancer Maternal Aunt     Social History   Tobacco Use  . Smoking status: Never Smoker  . Smokeless tobacco: Never Used  Vaping Use  . Vaping Use: Never used  Substance Use Topics  . Alcohol use: Not Currently  . Drug use: No    Home Medications Prior to Admission medications  Medication Sig Start Date End Date Taking? Authorizing Provider  acyclovir (ZOVIRAX) 200 MG/5ML suspension Take 20 mLs (800 mg total) by mouth daily. 02/18/21   Matt Holmes, PA  acyclovir (ZOVIRAX) 800 MG tablet Take 1 tablet (800 mg total) by mouth daily. 10/28/20   Matt Holmes, PA  cetirizine (ZYRTEC ALLERGY) 10 MG tablet Take 1 tablet (10 mg total) by mouth daily. 09/16/20   Wallis Bamberg, PA-C  desipramine (NORPRAMIN) 25 MG tablet Take by mouth. 07/04/20 07/04/21  [provider]  desipramine (NORPRAMIN) 25 MG tablet Take by mouth. 07/04/20   [provider]  dicyclomine (BENTYL) 10 MG capsule Take 10 mg by mouth every 6 (six) hours as needed. 03/14/20   [provider]  escitalopram (LEXAPRO) 10 MG tablet  04/18/20   [provider]  pantoprazole (PROTONIX) 20 MG tablet Take 20 mg by mouth daily. Patient not taking: Reported on 05/20/2020    [provider]  pseudoephedrine (SUDAFED) 60 MG  tablet Take 1 tablet (60 mg total) by mouth every 8 (eight) hours as needed for congestion. 09/16/20   Wallis Bamberg, PA-C    Allergies    Bee venom, Latex, and Penicillins  Review of Systems   Review of Systems  Constitutional: Negative for chills and fever.  Gastrointestinal: Positive for abdominal pain and constipation. Negative for vomiting.  All other systems reviewed and are negative.   Physical Exam Updated Vital Signs BP 137/90 (BP Location: Right Arm)   Pulse (!) 58   Temp 99.1 F (37.3 C) (Oral)   Resp 14   LMP 01/23/2021   SpO2 100%   Physical Exam Vitals and nursing note reviewed.  Constitutional:      Appearance: She is not ill-appearing.  HENT:     Head: Normocephalic and atraumatic.  Eyes:     Conjunctiva/sclera: Conjunctivae normal.  Cardiovascular:     Rate and Rhythm: Normal rate and regular rhythm.  Pulmonary:     Effort: Pulmonary effort is normal.     Breath sounds: Normal breath sounds. No wheezing, rhonchi or rales.  Abdominal:     General: Bowel sounds are normal.     Palpations: Abdomen is soft.     Tenderness: There is abdominal tenderness. There is no guarding or rebound.     Comments: Soft, very mild diffuse abdominal TTP, +BS throughout, no r/g/r, neg murphy's, neg mcburney's, no CVA TTP  Musculoskeletal:     Cervical back: Neck supple.  Skin:    General: Skin is warm and dry.  Neurological:     Mental Status: She is alert.     ED Results / Procedures / Treatments   Labs (all labs ordered are listed, but only abnormal results are displayed) Labs Reviewed  COMPREHENSIVE METABOLIC PANEL - Abnormal; Notable for the following components:      Result Value   Potassium 3.4 (*)    BUN 5 (*)    Calcium 8.6 (*)    Anion gap 4 (*)    All other components within normal limits  CBC WITH DIFFERENTIAL/PLATELET  LIPASE, BLOOD  POC URINE PREG, ED    EKG None  Radiology DG Abd Acute W/Chest  Result Date: 02/21/2021 CLINICAL DATA:   Generalized abdominal pain EXAM: DG ABDOMEN ACUTE WITH 1 VIEW CHEST COMPARISON:  03/30/2020 CT FINDINGS: Cardiac shadow is within normal limits. The lungs are clear bilaterally. No focal infiltrate or effusion is noted. No bony abnormality is seen. Scattered large and small bowel gas is  noted. Mild retained fecal material is seen without obstructive change. No abnormal mass or abnormal calcifications are noted. No bony abnormality is seen. IMPRESSION: Mild retained fecal material without obstructive change. No other focal abnormality is noted. Electronically Signed   By: Alcide Clever M.D.   On: 02/21/2021 16:49    Procedures Procedures   Medications Ordered in ED Medications - No data to display  ED Course  I have reviewed the triage vital signs and the nursing notes.  Pertinent labs & imaging results that were available during my care of the patient were reviewed by me and considered in my medical decision making (see chart for details).    MDM Rules/Calculators/A&P                          40 year old female presents to the ED today complaining of constipation.  Has tried 2 capfuls of MiraLAX without relief.  Advised by her GI doctor to take 8 capfuls in 64 ounces of fluid which she has not done.  Having some mild abdominal discomfort with same.  No vomiting.  No abdominal surgeries.  On arrival to the ED vitals are stable.  Patient is afebrile, nontachycardic and nontachypneic and appears to be in no acute distress.  She was initially medically screened by myself.  X-ray ordered which does show some mild retained fecal material without obstructive change at this time.  Lab work without acute findings.  Pregnancy test negative.  Given no obvious signs of fecal impaction on the x-ray I do not feel she requires a rectal exam at this time.  She has active bowel sounds on exam, no concern for SBO.  Have instructed that she take the 8 capfuls of MiraLAX as directed by her GI doctor to follow-up with  him for same.  She is in agreement with plan at this time and stable for discharge.  This note was prepared using Dragon voice recognition software and may include unintentional dictation errors due to the inherent limitations of voice recognition software.  Final Clinical Impression(s) / ED Diagnoses Final diagnoses:  Constipation, unspecified constipation type    Rx / DC Orders ED Discharge Orders    None       Discharge Instructions     Purchase an 8 ounce bottle of Miralax or generic equivalent (this may be labeled as a 14 day supply). This is available at pharmacies without a prescription.  Purchase 64 ounces of a clear liquid such as Gatorade, Pedialyte, Crystal light, apple juice, Lemonade, etc. If you are diabetic, please use a sugar free drink.  Mix all 8 ounces of Miralax into the 64 ounces of clear liquid. Make sure the mixture if chilled and shaken well.  Drink an 8 ounce glass of the mixture every 15-30 minutes until gone.  If you get nauseated you may slow down the drinking but you need to try to drink the entire amount.  If your stools are not clear by the next day, you may repeat this process one time.   Following this clean out, begin taking one capful of Miralax daily with 8 ounces of water in the morning. You can increase this dosage of MiraLax as needed to have a BM every few days with regularity.  Follow up with your GI doctor regarding ED visit  Return to the ED for any new/worsening symptoms       Tanda Rockers, Cordelia Poche 02/21/21 1803    Bethann Berkshire, MD  02/25/21 1233  

## 2021-02-21 NOTE — ED Provider Notes (Signed)
Emergency Medicine Provider Triage Evaluation Note  Rachel Stevens , a 40 y.o. female  was evaluated in triage.  Pt complains of constipation intermittently since 05/13 however has had small BMs until her constipation worsened 3 days ago. Has not had a BM in 3 days. Does not feel like she is passing gas. Seen by GI yesterday and advised to take 8 capfulls of miralax in 64 ounces of fluids which she has done without relief prompting her ED visit. She did an enema a few days ago before her constipation worsened with mild relief.   Review of Systems  Positive: + constipation Negative: - vomiting  Physical Exam  BP 112/82 (BP Location: Right Arm)   Pulse 67   Temp 98.5 F (36.9 C) (Oral)   Resp 16   SpO2 100%  Gen:   Awake, no distress   Resp:  Normal effort  MSK:   Moves extremities without difficulty  Other:  + mild diffuse abdominal TTP however + active BS  Medical Decision Making  Medically screening exam initiated at 4:01 PM.  Appropriate orders placed.  Louie Bun was informed that the remainder of the evaluation will be completed by another provider, this initial triage assessment does not replace that evaluation, and the importance of remaining in the ED until their evaluation is complete.  Labs and xray ordered at this time. Pt will need further workup for constipation.    Tanda Rockers, PA-C 02/21/21 1605    Bethann Berkshire, MD 02/25/21 1230

## 2021-02-21 NOTE — Discharge Instructions (Addendum)
Purchase an 8 ounce bottle of Miralax or generic equivalent (this may be labeled as a 14 day supply). This is available at pharmacies without a prescription.  Purchase 64 ounces of a clear liquid such as Gatorade, Pedialyte, Crystal light, apple juice, Lemonade, etc. If you are diabetic, please use a sugar free drink.  Mix all 8 ounces of Miralax into the 64 ounces of clear liquid. Make sure the mixture if chilled and shaken well.  Drink an 8 ounce glass of the mixture every 15-30 minutes until gone.  If you get nauseated you may slow down the drinking but you need to try to drink the entire amount.  If your stools are not clear by the next day, you may repeat this process one time.   Following this clean out, begin taking one capful of Miralax daily with 8 ounces of water in the morning. You can increase this dosage of MiraLax as needed to have a BM every few days with regularity.  Follow up with your GI doctor regarding ED visit  Return to the ED for any new/worsening symptoms

## 2021-02-21 NOTE — ED Notes (Signed)
Pt ambulated to the bathroom without any trouble.  

## 2021-02-21 NOTE — ED Triage Notes (Signed)
No bowel movement for 2 days, c/o nausea

## 2021-06-25 ENCOUNTER — Other Ambulatory Visit: Payer: Self-pay

## 2021-06-25 ENCOUNTER — Encounter (HOSPITAL_COMMUNITY): Payer: Self-pay | Admitting: *Deleted

## 2021-06-25 ENCOUNTER — Emergency Department (HOSPITAL_COMMUNITY)
Admission: EM | Admit: 2021-06-25 | Discharge: 2021-06-25 | Disposition: A | Discharge status: Home / Self Care | Payer: BLUE CROSS/BLUE SHIELD | Attending: Emergency Medicine | Admitting: Emergency Medicine

## 2021-06-25 ENCOUNTER — Emergency Department (HOSPITAL_COMMUNITY): Payer: BLUE CROSS/BLUE SHIELD

## 2021-06-25 DIAGNOSIS — R5383 Other fatigue: Secondary | ICD-10-CM | POA: Diagnosis not present

## 2021-06-25 DIAGNOSIS — R112 Nausea with vomiting, unspecified: Secondary | ICD-10-CM | POA: Insufficient documentation

## 2021-06-25 DIAGNOSIS — R519 Headache, unspecified: Secondary | ICD-10-CM | POA: Diagnosis not present

## 2021-06-25 DIAGNOSIS — Z91040 Latex allergy status: Secondary | ICD-10-CM | POA: Diagnosis not present

## 2021-06-25 LAB — CBC WITH DIFFERENTIAL/PLATELET
Abs Immature Granulocytes: 0.03 10*3/uL (ref 0.00–0.07)
Basophils Absolute: 0 10*3/uL (ref 0.0–0.1)
Basophils Relative: 0 %
Eosinophils Absolute: 0.1 10*3/uL (ref 0.0–0.5)
Eosinophils Relative: 1 %
HCT: 37.1 % (ref 36.0–46.0)
Hemoglobin: 12.2 g/dL (ref 12.0–15.0)
Immature Granulocytes: 0 %
Lymphocytes Relative: 33 %
Lymphs Abs: 3.3 10*3/uL (ref 0.7–4.0)
MCH: 29.9 pg (ref 26.0–34.0)
MCHC: 32.9 g/dL (ref 30.0–36.0)
MCV: 90.9 fL (ref 80.0–100.0)
Monocytes Absolute: 0.6 10*3/uL (ref 0.1–1.0)
Monocytes Relative: 6 %
Neutro Abs: 6 10*3/uL (ref 1.7–7.7)
Neutrophils Relative %: 60 %
Platelets: 271 10*3/uL (ref 150–400)
RBC: 4.08 MIL/uL (ref 3.87–5.11)
RDW: 12.1 % (ref 11.5–15.5)
WBC: 10 10*3/uL (ref 4.0–10.5)
nRBC: 0 % (ref 0.0–0.2)

## 2021-06-25 LAB — BASIC METABOLIC PANEL
Anion gap: 4 — ABNORMAL LOW (ref 5–15)
BUN: 16 mg/dL (ref 6–20)
CO2: 28 mmol/L (ref 22–32)
Calcium: 9.3 mg/dL (ref 8.9–10.3)
Chloride: 105 mmol/L (ref 98–111)
Creatinine, Ser: 0.98 mg/dL (ref 0.44–1.00)
GFR, Estimated: >60 mL/min (ref >60)
Glucose, Bld: 89 mg/dL (ref 70–99)
Potassium: 3.4 mmol/L — ABNORMAL LOW (ref 3.5–5.1)
Sodium: 137 mmol/L (ref 135–145)

## 2021-06-25 LAB — URINALYSIS, ROUTINE W REFLEX MICROSCOPIC
Bilirubin Urine: NEGATIVE
Glucose, UA: NEGATIVE mg/dL
Hgb urine dipstick: NEGATIVE
Ketones, ur: NEGATIVE mg/dL
Leukocytes,Ua: NEGATIVE
Nitrite: NEGATIVE
Protein, ur: NEGATIVE mg/dL
Specific Gravity, Urine: 1.016 (ref 1.005–1.030)
pH: 6 (ref 5.0–8.0)

## 2021-06-25 LAB — POC URINE PREG, ED: Preg Test, Ur: NEGATIVE

## 2021-06-25 NOTE — ED Notes (Signed)
Patient transported to CT 

## 2021-06-25 NOTE — Discharge Instructions (Signed)
Please follow-up with your primary care provider. You may use ibuprofen (400-600 mg) every 6 hours as needed for headache. Refer to the attached instructions.

## 2021-06-25 NOTE — ED Provider Notes (Signed)
[facility] EMERGENCY DEPARTMENT Provider Note   CSN: [id number] Arrival date & time: 06/25/21  1830     History Chief Complaint  Patient presents with   Fatigue    Rachel Stevens is a 40 y.o. female.  Patient complaining of left sided headache since Sunday, along with a general feeling of fatigue for one-two days. Patient denies fever, chills, chest or abdominal pain, cough, shortness of breath, sore throat. Patient has tried tylenol for headache without improvement. No prior history of migraines. No weakness, photophobia. Mild nausea with episode of vomiting today.  The history is provided by the patient. No language interpreter was used.  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   Irregular periods 01/31/2015   Missed period 11/01/2014   Patient desires pregnancy 11/01/2014   Pelvic floor tension    Vaginal discharge 11/01/2014   Vulvar pain    Weight gain 11/01/2014    Patient Active Problem List   Diagnosis Date Noted   Anxiety 08/19/2020   Breast mass in female 03/04/2020   Elevated blood pressure reading 02/21/2020   Obesity BMI=30.9  160 lbs 07/05/2019   Pelvic "burning" x3 years 07/05/2019   History of reduction surgery of right breast 2009 07/05/2019   Depression 07/05/2015   Dizziness of unknown cause 07/05/2015   Fatigue 07/05/2015   Vitamin B12 deficiency 07/05/2015    Vitamin D insufficiency 07/05/2015   Vulvar pain 07/05/2015   Irregular periods 01/31/2015   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   Female infertility 07/15/2011    Past Surgical History:  Procedure Laterality Date   REDUCTION MAMMAPLASTY Right      OB History     Gravida  0   Para      Term      Preterm      AB      Living         SAB      IAB      Ectopic      Multiple      Live Births              Family History  Problem Relation Age of Onset   Hypertension Mother    Heart disease Mother    Cancer Father        throat   Pancreatic cancer Father    Congestive Heart Failure Brother    Heart disease Maternal Grandmother    Hypertension Maternal Grandmother    Cancer Maternal Grandfather    Hypertension Maternal Grandfather    Cancer Paternal Grandmother        breast   Breast cancer Maternal Aunt     Social History   Tobacco Use   Smoking status: Never   Smokeless tobacco: Never  Vaping Use   Vaping Use: Never used  Substance Use Topics   Alcohol use:  Not Currently   Drug use: No    Home Medications Prior to Admission medications   Medication Sig Start Date End Date Taking? Authorizing Provider  acyclovir (ZOVIRAX) 200 MG/5ML suspension Take 20 mLs (800 mg total) by mouth daily. 02/18/21   Matt Holmes, PA  acyclovir (ZOVIRAX) 800 MG tablet Take 1 tablet (800 mg total) by mouth daily. 10/28/20   Matt Holmes, PA  cetirizine (ZYRTEC ALLERGY) 10 MG tablet Take 1 tablet (10 mg total) by mouth daily. 09/16/20   Wallis Bamberg, PA-C  desipramine (NORPRAMIN) 25 MG tablet Take by mouth. 07/04/20 07/04/21  [provider]  desipramine (NORPRAMIN) 25 MG tablet Take by mouth. 07/04/20   [provider]  dicyclomine (BENTYL) 10 MG capsule Take 10 mg by mouth every 6 (six) hours as needed. 03/14/20   [provider]  escitalopram (LEXAPRO) 10 MG tablet  04/18/20   [provider]  pantoprazole  (PROTONIX) 20 MG tablet Take 20 mg by mouth daily. Patient not taking: Reported on 05/20/2020    [provider]  pseudoephedrine (SUDAFED) 60 MG tablet Take 1 tablet (60 mg total) by mouth every 8 (eight) hours as needed for congestion. 09/16/20   Wallis Bamberg, PA-C    Allergies    Bee venom, Latex, and Penicillins  Review of Systems   Review of Systems  Constitutional:  Positive for fatigue.  HENT:  Negative for sore throat.   Eyes:  Negative for blurred vision and photophobia.  Respiratory:  Negative for cough.   Gastrointestinal:  Positive for nausea and vomiting. Negative for abdominal pain.  Musculoskeletal:  Negative for neck stiffness.  Neurological:  Positive for headaches. Negative for dizziness and focal weakness.  All other systems reviewed and are negative.  Physical Exam Updated Vital Signs BP (!) 134/97 (BP Location: Right Arm)   Pulse 63   Temp 97.9 F (36.6 C) (Oral)   Resp 18   Ht 5\' 6"  (1.676 m)   Wt 77.1 kg   LMP 06/20/2021   SpO2 100%   BMI 27.44 kg/m   Physical Exam Constitutional:      Appearance: Normal appearance.  HENT:     Head: Normocephalic and atraumatic.     Nose: Nose normal.     Mouth/Throat:     Mouth: Mucous membranes are moist.  Eyes:     Extraocular Movements: Extraocular movements intact.  Cardiovascular:     Rate and Rhythm: Normal rate and regular rhythm.  Pulmonary:     Effort: Pulmonary effort is normal.     Breath sounds: Normal breath sounds.  Abdominal:     Palpations: Abdomen is soft.  Musculoskeletal:        General: Normal range of motion.  Skin:    General: Skin is warm and dry.  Neurological:     General: No focal deficit present.     Mental Status: She is alert and oriented to person, place, and time.     Cranial Nerves: No cranial nerve deficit.     Motor: No weakness.  Psychiatric:        Mood and Affect: Mood normal.        Behavior: Behavior normal.    ED Results / Procedures / Treatments    Labs (all labs ordered are listed, but only abnormal results are displayed) Labs Reviewed - No data to display  EKG None  Radiology No results found.  CLINICAL DATA: Headache  EXAM: CT HEAD WITHOUT CONTRAST  TECHNIQUE: Contiguous  axial images were obtained from the base of the skull through the vertex without intravenous contrast.  COMPARISON: 10/23/2020  FINDINGS: Brain: There is no mass, hemorrhage or extra-axial collection. The size and configuration of the ventricles and extra-axial CSF spaces are normal. The brain parenchyma is normal, without acute or chronic infarction.  Vascular: No abnormal hyperdensity of the major intracranial arteries or dural venous sinuses. No intracranial atherosclerosis.  Skull: The visualized skull base, calvarium and extracranial soft tissues are normal.  Sinuses/Orbits: No fluid levels or advanced mucosal thickening of the visualized paranasal sinuses. No mastoid or middle ear effusion. The orbits are normal.  IMPRESSION: Normal head CT.   Electronically Signed By: Deatra Robinson M.D. On: 06/25/2021 20:35  Procedures Procedures   Medications Ordered in ED Medications - No data to display  ED Course  I have reviewed the triage vital signs and the nursing notes.  Pertinent labs & imaging results that were available during my care of the patient were reviewed by me and considered in my medical decision making (see chart for details).    MDM Rules/Calculators/A&P                           Presentation is mot concerning for [facility], ICH, Meningitis, or temporal arteritis. Normal head CT. Pt is afebrile with no focal neuro deficits, nuchal rigidity, or change in vision. Pt is to follow up with PCP to discuss prophylactic medication. Pt verbalizes understanding and is agreeable with plan to dc.   Final Clinical Impression(s) / ED Diagnoses Final diagnoses:  Fatigue, unspecified type  Acute nonintractable headache, unspecified  headache type    Rx / DC Orders ED Discharge Orders     None        Felicie Morn, NP 06/25/21 2345    Pollyann Savoy, MD 06/27/21 [phone]

## 2021-06-25 NOTE — ED Triage Notes (Signed)
Pt c/o feeling fatigued and left sided headache that started Sunday. Denies cough, fever, sore throat.

## 2021-09-30 ENCOUNTER — Encounter (HOSPITAL_COMMUNITY): Payer: Self-pay | Admitting: Emergency Medicine

## 2021-09-30 ENCOUNTER — Other Ambulatory Visit: Payer: Self-pay

## 2021-09-30 ENCOUNTER — Ambulatory Visit (HOSPITAL_COMMUNITY)
Admission: EM | Admit: 2021-09-30 | Discharge: 2021-09-30 | Disposition: A | Discharge status: Home / Self Care | Payer: BLUE CROSS/BLUE SHIELD | Attending: Emergency Medicine | Admitting: Emergency Medicine

## 2021-09-30 DIAGNOSIS — J019 Acute sinusitis, unspecified: Secondary | ICD-10-CM | POA: Insufficient documentation

## 2021-09-30 DIAGNOSIS — N760 Acute vaginitis: Secondary | ICD-10-CM | POA: Insufficient documentation

## 2021-09-30 DIAGNOSIS — Z20822 Contact with and (suspected) exposure to covid-19: Secondary | ICD-10-CM | POA: Insufficient documentation

## 2021-09-30 DIAGNOSIS — R519 Headache, unspecified: Secondary | ICD-10-CM | POA: Insufficient documentation

## 2021-09-30 DIAGNOSIS — Z113 Encounter for screening for infections with a predominantly sexual mode of transmission: Secondary | ICD-10-CM | POA: Insufficient documentation

## 2021-09-30 LAB — POC INFLUENZA A AND B ANTIGEN (URGENT CARE ONLY)
INFLUENZA A ANTIGEN, POC: NEGATIVE
INFLUENZA B ANTIGEN, POC: NEGATIVE

## 2021-09-30 MED ORDER — KETOROLAC TROMETHAMINE 30 MG/ML IJ SOLN
INTRAMUSCULAR | Status: AC
  Filled 2021-09-30: qty 1

## 2021-09-30 MED ORDER — IBUPROFEN 600 MG PO TABS: 600.0000 mg | ORAL_TABLET | Freq: Four times a day (QID) | ORAL | 0 refills | Status: DC | PRN

## 2021-09-30 MED ORDER — ACETAMINOPHEN 325 MG PO TABS
ORAL_TABLET | ORAL | Status: AC
  Filled 2021-09-30: qty 3

## 2021-09-30 MED ORDER — DOXYCYCLINE HYCLATE 100 MG PO CAPS: 100.0000 mg | ORAL_CAPSULE | Freq: Two times a day (BID) | ORAL | 0 refills | Status: AC

## 2021-09-30 MED ORDER — MICONAZOLE NITRATE 2 % EX CREA: 1.0000 "application " | TOPICAL_CREAM | Freq: Two times a day (BID) | CUTANEOUS | 0 refills | Status: DC

## 2021-09-30 MED ORDER — HYDROCOD POLST-CPM POLST ER 10-8 MG/5ML PO SUER: 5.0000 mL | Freq: Two times a day (BID) | ORAL | 0 refills | Status: DC | PRN

## 2021-09-30 MED ORDER — ACETAMINOPHEN 325 MG PO TABS
975.0000 mg | ORAL_TABLET | Freq: Once | ORAL | Status: AC
  Administered 2021-09-30: 975 mg via ORAL

## 2021-09-30 MED ORDER — FLUCONAZOLE 150 MG PO TABS: 150.0000 mg | ORAL_TABLET | Freq: Once | ORAL | 1 refills | Status: AC

## 2021-09-30 MED ORDER — KETOROLAC TROMETHAMINE 30 MG/ML IJ SOLN
30.0000 mg | Freq: Once | INTRAMUSCULAR | Status: AC
  Administered 2021-09-30: 30 mg via INTRAMUSCULAR

## 2021-09-30 MED ORDER — METRONIDAZOLE 500 MG PO TABS: 500.0000 mg | ORAL_TABLET | Freq: Two times a day (BID) | ORAL | 0 refills | Status: DC

## 2021-09-30 NOTE — ED Triage Notes (Signed)
Pt reports for a week having headache, body aches and chills.  Reports having vaginal burning for 3 days with little discharge.

## 2021-09-30 NOTE — Discharge Instructions (Addendum)
You may take 600 mg of motrin with 1000 mg of tylenol up to 3-4 times a day as needed for pain. This is an effective combination for pain.  Use a NeilMed sinus rinse with distilled water as often as you want to to reduce nasal congestion. Follow the directions on the box.  Finish doxycycline, even if you feel better.  Promethazine DM is not covered by your insurance so I have given you Tussionex.  Start miconazole cream today.  Start the Diflucan and Flagyl based on the labs.  We will contact you if you or your partner need further treatment.  No intercourse until your symptoms have resolved and all of your labs are back.  Go to www.goodrx.com to look up your medications. This will give you a list of where you can find your prescriptions at the most affordable prices. Or you can ask the pharmacist what the cash price is. This is frequently cheaper than going through insurance.

## 2021-09-30 NOTE — ED Provider Notes (Signed)
HPI  SUBJECTIVE:  Rachel Stevens is a 41 y.o. female who presents with 2 issues: First, she reports 1 week of intermittent, hours long headache located behind her eyes, mild photophobia, body aches, chills.  She states that this is getting better.  She had a cough and a sore throat which is resolving, states that she was having difficulty sleeping at night because of the cough, but that this is also getting better.  She reports double sickening..  She reports intermittent nausea.  No fevers, nasal congestion, rhinorrhea, sinus pain or pressure, postnasal drip, wheezing, shortness of breath, vomiting, diarrhea, abdominal pain.  No neck stiffness, rash.  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  She had a second dose of the COVID-vaccine.  She did not get the flu vaccine.  He has tried lemon ginger tea, 200 mg of ibuprofen and Ricola cough drops without improvement in her symptoms.  No aggravating factors.  Second, she reports 3 days of burning vaginal/labial pain, itching.  No blisters, rash, vaginal bleeding, odor, discharge, pelvic abdominal, back pain.  No urinary complaints.  No recent antibiotics.  She is not a new sexual relationship with a female, who is asymptomatic, she denies having any other partners, but is not sure about him.  She would like to be checked for STDs.  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  No history of gonorrhea, HIV, syphilis, trichomonas.  No history of migraines, hypertension, pulmonary disease, diabetes.  LMP: Now.  Denies possibility being pregnant.   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   Irregular periods 01/31/2015   Missed period 11/01/2014   Patient desires pregnancy 11/01/2014   Pelvic floor tension    Vaginal discharge 11/01/2014   Vulvar pain    Weight gain 11/01/2014    Past Surgical  History:  Procedure Laterality Date   REDUCTION MAMMAPLASTY Right     Family History  Problem Relation Age of Onset   Hypertension Mother    Heart disease Mother    Cancer Father        throat   Pancreatic cancer Father    Congestive Heart Failure Brother    Heart disease Maternal Grandmother    Hypertension Maternal Grandmother    Cancer Maternal Grandfather    Hypertension Maternal Grandfather    Cancer Paternal Grandmother        breast   Breast cancer Maternal Aunt     Social History   Tobacco Use   Smoking status: Never   Smokeless tobacco: Never  Vaping Use   Vaping Use: Never used  Substance Use Topics   Alcohol use: Not Currently   Drug use: No     Current Facility-Administered Medications:    acetaminophen (TYLENOL) tablet 975 mg, 975 mg, Oral, Once, Domenick Gong, MD   ketorolac (TORADOL) 30 MG/ML injection 30 mg, 30 mg, Intramuscular, Once, Domenick Gong, MD  Current Outpatient Medications:    chlorpheniramine-HYDROcodone (TUSSIONEX PENNKINETIC ER) 10-8 MG/5ML SUER, Take 5 mLs by mouth every 12 (twelve) hours as needed for cough., Disp: 60 mL, Rfl: 0   doxycycline (VIBRAMYCIN) 100 MG capsule, Take 1 capsule (100 mg total) by mouth 2 (two) times daily for 10 days., Disp: 20 capsule, Rfl: 0   fluconazole (DIFLUCAN) 150 MG tablet, Take 1 tablet (150 mg  total) by mouth once for 1 dose. 1 tab po x 1. May repeat in 72 hours if no improvement, Disp: 2 tablet, Rfl: 1   ibuprofen (ADVIL) 600 MG tablet, Take 1 tablet (600 mg total) by mouth every 6 (six) hours as needed., Disp: 30 tablet, Rfl: 0   metroNIDAZOLE (FLAGYL) 500 MG tablet, Take 1 tablet (500 mg total) by mouth 2 (two) times daily for 7 days., Disp: 14 tablet, Rfl: 0   miconazole (MICOTIN) 2 % cream, Apply 1 application topically 2 (two) times daily., Disp: 28.35 g, Rfl: 0   acyclovir (ZOVIRAX) 200 MG/5ML suspension, Take 20 mLs (800 mg total) by mouth daily., Disp: 600 mL, Rfl: 9   acyclovir  (ZOVIRAX) 800 MG tablet, Take 1 tablet (800 mg total) by mouth daily., Disp: 90 tablet, Rfl: 3   desipramine (NORPRAMIN) 25 MG tablet, Take by mouth., Disp: , Rfl:    desipramine (NORPRAMIN) 25 MG tablet, Take by mouth., Disp: , Rfl:    dicyclomine (BENTYL) 10 MG capsule, Take 10 mg by mouth every 6 (six) hours as needed., Disp: , Rfl:    escitalopram (LEXAPRO) 10 MG tablet, , Disp: , Rfl:   Allergies  Allergen Reactions   Bee Venom Other (See Comments)    blisters   Latex Swelling   Penicillins Hives    .Has patient had a PCN reaction causing immediate rash, facial/tongue/throat swelling, SOB or lightheadedness with hypotension: Yes Has patient had a PCN reaction causing severe rash involving mucus membranes or skin necrosis: No Has patient had a PCN reaction that required hospitalization: No Has patient had a PCN reaction occurring within the last 10 years: No If all of the above answers are "NO", then may proceed with Cephalosporin use.      ROS  As noted in HPI.   Physical Exam  BP 130/83 (BP Location: Right Arm)    Pulse 61    Temp 97.7 F (36.5 C) (Oral)    Resp 18    LMP 09/26/2021    SpO2 95%   Constitutional: Well developed, well nourished, no acute distress Eyes: PERRL, EOMI, conjunctiva normal bilaterally.  Positive mild photophobia. HENT: Normocephalic, atraumatic,mucus membranes moist.  Minimal nasal congestion.  Erythematous, swollen turbinates.  Positive maxillary sinus tenderness.  Normal tonsils without exudates, uvula midline. Neck: No cervical lymphadenopathy, meningismus, trapezial tenderness. Respiratory: Clear to auscultation bilaterally, no rales, no wheezing, no rhonchi Cardiovascular: Normal rate and rhythm, no murmurs, no gallops, no rubs Abdomen: Nondistended skin: No rash, skin intact Musculoskeletal: No deformities Neurologic: Alert & oriented x 3, CN III-XII grossly intact, no motor deficits, sensation grossly intact Psychiatric: Speech and  behavior appropriate   ED Course   Medications  acetaminophen (TYLENOL) tablet 975 mg (has no administration in time range)  ketorolac (TORADOL) 30 MG/ML injection 30 mg (has no administration in time range)    Orders Placed This Encounter  Procedures   SARS CORONAVIRUS 2 (TAT 6-24 HRS) Nasopharyngeal Nasopharyngeal Swab    Standing Status:   Standing    Number of Occurrences:   1   Droplet precaution    Standing Status:   Standing    Number of Occurrences:   1   POC Influenza A & B Ag (Urgent Care)    Standing Status:   Standing    Number of Occurrences:   1   No results found for this or any previous visit (from the past 24 hour(s)). No results found.  ED Clinical Impression  1.  Acute nonintractable headache, unspecified headache type   2. Acute non-recurrent sinusitis, unspecified location   3. Encounter for laboratory testing for COVID-19 virus   4. Vaginitis and vulvovaginitis   5. Screening for STDs (sexually transmitted diseases)      ED Assessment/Plan  1.  Headache.  Suspect recent viral illness with secondary sinusitis given the sinus tenderness and erythematous, swollen tender.  Could be migraine, but she denies history of migraines.  checking for COVID, flu per patient request.  Discussed with patient that she is out of the treatment window for both, but states that she needs it for work.  No evidence of meningitis. Toradol, Tylenol here.  Will send home with Augmentin, saline nasal irrigation, Promethazine DM, Tylenol/ibuprofen.  Work note for 2 days.  Promethazine DM not covered by patient's insurance.  Recommends Tussionex.  [facility] [place].  No opiate prescriptions in 2 years.   2. vaginal pain/STD screening.  STD, BV, yeast sent.  Will send home with Diflucan, Flagyl and miconazole cream.  She is to start miconazole cream.  She will start either the Diflucan or Flagyl when the labs come back.  Discussed with her that if a rash  appears, it could be herpes despite her being on a suppressive dose.  She will follow-up with her doctor or return here if that happens.   Discussed labs,  MDM, treatment plan, and plan for follow-up with patient Discussed sn/sx that should prompt return to the ED. patient agrees with plan.   Meds ordered this encounter  Medications   acetaminophen (TYLENOL) tablet 975 mg   ketorolac (TORADOL) 30 MG/ML injection 30 mg   ibuprofen (ADVIL) 600 MG tablet    Sig: Take 1 tablet (600 mg total) by mouth every 6 (six) hours as needed.    Dispense:  30 tablet    Refill:  0   metroNIDAZOLE (FLAGYL) 500 MG tablet    Sig: Take 1 tablet (500 mg total) by mouth 2 (two) times daily for 7 days.    Dispense:  14 tablet    Refill:  0   fluconazole (DIFLUCAN) 150 MG tablet    Sig: Take 1 tablet (150 mg total) by mouth once for 1 dose. 1 tab po x 1. May repeat in 72 hours if no improvement    Dispense:  2 tablet    Refill:  1   miconazole (MICOTIN) 2 % cream    Sig: Apply 1 application topically 2 (two) times daily.    Dispense:  28.35 g    Refill:  0   doxycycline (VIBRAMYCIN) 100 MG capsule    Sig: Take 1 capsule (100 mg total) by mouth 2 (two) times daily for 10 days.    Dispense:  20 capsule    Refill:  0   chlorpheniramine-HYDROcodone (TUSSIONEX PENNKINETIC ER) 10-8 MG/5ML SUER    Sig: Take 5 mLs by mouth every 12 (twelve) hours as needed for cough.    Dispense:  60 mL    Refill:  0      *This clinic note was created using Scientist, clinical (histocompatibility and immunogenetics)Dragon dictation software. Therefore, there may be occasional mistakes despite careful proofreading. ?    Domenick GongMortenson, Nekesha Font, MD 10/02/21 [id number]

## 2021-10-01 ENCOUNTER — Telehealth (HOSPITAL_COMMUNITY): Payer: Self-pay | Admitting: Physician Assistant

## 2021-10-01 LAB — CERVICOVAGINAL ANCILLARY ONLY
Bacterial Vaginitis (gardnerella): POSITIVE — AB
Candida Glabrata: NEGATIVE
Candida Vaginitis: NEGATIVE
Chlamydia: NEGATIVE
Comment: NEGATIVE
Comment: NEGATIVE
Comment: NEGATIVE
Comment: NEGATIVE
Comment: NEGATIVE
Comment: NORMAL
Neisseria Gonorrhea: NEGATIVE
Trichomonas: NEGATIVE

## 2021-10-01 LAB — SARS CORONAVIRUS 2 (TAT 6-24 HRS): SARS Coronavirus 2: NEGATIVE

## 2021-10-01 MED ORDER — METRONIDAZOLE 0.75 % VA GEL: 1.0000 | Freq: Every day | VAGINAL | 0 refills | Status: DC

## 2021-10-01 NOTE — Telephone Encounter (Signed)
Received phone message from nursing staff that patient was requesting medication be switched to a gel.  STI swab collected at her visit was positive for bacterial vaginosis so metronidazole gel sent to pharmacy as requested.  Recommended that nursing staff inform her she should still avoid alcohol use with metronidazole gel due to Antabuse side effects.

## 2021-10-04 ENCOUNTER — Encounter (HOSPITAL_COMMUNITY): Payer: Self-pay

## 2021-10-04 ENCOUNTER — Other Ambulatory Visit: Payer: Self-pay

## 2021-10-04 ENCOUNTER — Emergency Department (HOSPITAL_COMMUNITY)
Admission: EM | Admit: 2021-10-04 | Discharge: 2021-10-04 | Disposition: A | Discharge status: Home / Self Care | Payer: BLUE CROSS/BLUE SHIELD | Attending: Emergency Medicine | Admitting: Emergency Medicine

## 2021-10-04 DIAGNOSIS — G43909 Migraine, unspecified, not intractable, without status migrainosus: Secondary | ICD-10-CM | POA: Diagnosis not present

## 2021-10-04 DIAGNOSIS — Z5321 Procedure and treatment not carried out due to patient leaving prior to being seen by health care provider: Secondary | ICD-10-CM | POA: Insufficient documentation

## 2021-10-04 NOTE — ED Provider Triage Note (Signed)
Emergency Medicine Provider Triage Evaluation Note  Rachel Stevens , a 41 y.o. female  was evaluated in triage.  Pt complains of continued migraine that has been going on for 3 weeks.  Patient reports she has HSV-2 and has tried all the antiviral, acyclovir, valacyclovir and famciclovir.  States that none of them are working.  She gets a bad migraine every time that she has an outbreak.  Denies any lesions and states "my outbreaks are in my spinal cord."  Was seen at urgent care on the third and she was told if things did not get better to come to the emergency department to be tested for meningitis.  Reports she stopped taking the antivirals because they do not work.  Last valacyclovir dose was on Monday.  Review of Systems  Positive: Headache, photophobia, nausea Negative: Vomiting  Physical Exam  BP 119/84 (BP Location: Right Arm)    Pulse 75    Temp 98.4 F (36.9 C) (Oral)    Resp 16    Ht 5\' 6"  (1.676 m)    Wt 77.1 kg    LMP 09/26/2021    SpO2 99%    BMI 27.43 kg/m  Gen:   Awake, no distress   Resp:  Normal effort  MSK:   Moves extremities without difficulty  Other:  Photophobia present.  Closes eyes when light is shined in her eyes  Medical Decision Making  Medically screening exam initiated at 4:37 PM.  Appropriate orders placed.  Ruben Gottron was informed that the remainder of the evaluation will be completed by another provider, this initial triage assessment does not replace that evaluation, and the importance of remaining in the ED until their evaluation is complete.     Rhae Hammock, [place] 10/04/21 1639

## 2021-10-04 NOTE — ED Triage Notes (Signed)
Pt arrived via POV from home for HA, body aches and pain in spine up to headx1.5 wks. Pt states she was seen at UC 3 days ago for same and was told if she didn't get any better to come here for a follow-up. Pt states she get photo sensitivity and blurred vision with HA. Pt denies N/V with HA.

## 2021-11-05 ENCOUNTER — Ambulatory Visit
Admission: EM | Admit: 2021-11-05 | Discharge: 2021-11-05 | Disposition: A | Discharge status: Home / Self Care | Payer: BLUE CROSS/BLUE SHIELD | Attending: Family Medicine | Admitting: Family Medicine

## 2021-11-05 ENCOUNTER — Other Ambulatory Visit: Payer: Self-pay

## 2021-11-05 DIAGNOSIS — R42 Dizziness and giddiness: Secondary | ICD-10-CM | POA: Diagnosis not present

## 2021-11-05 MED ORDER — MECLIZINE HCL 25 MG PO TABS: 25.0000 mg | ORAL_TABLET | Freq: Three times a day (TID) | ORAL | 0 refills | Status: DC | PRN

## 2021-11-05 MED ORDER — ONDANSETRON 4 MG PO TBDP: 4.0000 mg | ORAL_TABLET | Freq: Three times a day (TID) | ORAL | 0 refills | Status: DC | PRN

## 2021-11-05 MED ORDER — PREDNISONE 20 MG PO TABS: 40.0000 mg | ORAL_TABLET | Freq: Every day | ORAL | 0 refills | Status: DC

## 2021-11-05 NOTE — ED Triage Notes (Signed)
Pt reports headache and dizziness x 1 day. Pt reports she had Meniere disease and needs  meclizine 25 mg refill.

## 2021-11-08 ENCOUNTER — Other Ambulatory Visit: Payer: Self-pay

## 2021-11-08 ENCOUNTER — Ambulatory Visit
Admission: EM | Admit: 2021-11-08 | Discharge: 2021-11-08 | Disposition: A | Discharge status: Home / Self Care | Payer: BLUE CROSS/BLUE SHIELD | Attending: Family Medicine | Admitting: Family Medicine

## 2021-11-08 ENCOUNTER — Ambulatory Visit: Payer: Self-pay

## 2021-11-08 DIAGNOSIS — R42 Dizziness and giddiness: Secondary | ICD-10-CM | POA: Diagnosis not present

## 2021-11-08 MED ORDER — ONDANSETRON 4 MG PO TBDP: 4.0000 mg | ORAL_TABLET | Freq: Three times a day (TID) | ORAL | 0 refills | Status: DC | PRN

## 2021-11-08 MED ORDER — PREDNISONE 20 MG PO TABS: 40.0000 mg | ORAL_TABLET | Freq: Every day | ORAL | 0 refills | Status: DC

## 2021-11-08 MED ORDER — MECLIZINE HCL 25 MG PO TABS: 25.0000 mg | ORAL_TABLET | Freq: Three times a day (TID) | ORAL | 0 refills | Status: DC | PRN

## 2021-11-08 NOTE — ED Provider Notes (Signed)
[facility]    CSN: [id number] Arrival date & time: 11/08/21  1434      History   Chief Complaint Chief Complaint  Patient presents with   Dizziness    HPI Rachel Stevens is a 41 y.o. female.   Presenting today following up on a prolonged episode of vertigo.  She states she was seen 3 days ago here at this urgent care for this issue and diagnosed with a recurrence of her chronic intermittent vertigo.  She was sent prednisone, meclizine, Zofran but she states it went to the wrong pharmacy so she was not able to pick it up.  She has had no change in her condition since she was here the other day.  She also states that her work needs another note to extend her time off as she is still not feeling better.  She does have an ear nose and throat specialist but she is waiting to get into them for a follow-up.  Denies mental status change, visual change, syncope, chest pain, shortness of breath, head injury.   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   Irregular periods 01/31/2015   Missed period 11/01/2014   Patient desires pregnancy 11/01/2014   Pelvic floor tension    Vaginal discharge 11/01/2014   Vulvar pain    Weight gain 11/01/2014    Patient Active Problem List   Diagnosis Date Noted   Anxiety 08/19/2020   Breast mass in female 03/04/2020   Elevated blood pressure reading 02/21/2020   Obesity BMI=30.9  160 lbs 07/05/2019   Pelvic "burning" x3 years 07/05/2019   History of reduction surgery of right breast 2009 07/05/2019   Depression 07/05/2015   Dizziness of unknown cause 07/05/2015   Fatigue 07/05/2015   Vitamin B12 deficiency 07/05/2015   Vitamin D insufficiency 07/05/2015   Vulvar pain 07/05/2015   Irregular periods 01/31/2015   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 Authorizing Provider  desipramine (NORPRAMIN) 25 MG tablet Take by mouth. 07/04/20 07/04/21  [provider]  meclizine (ANTIVERT) 25 MG tablet Take 1 tablet (25 mg total) by mouth 3 (three) times daily as needed for dizziness. 11/08/21   Particia Nearing, PA-C  ondansetron (ZOFRAN-ODT) 4 MG disintegrating tablet Take 1 tablet (4 mg total) by mouth every 8 (eight) hours as needed for nausea or vomiting. 11/08/21   Particia Nearing, PA-C  predniSONE (DELTASONE) 20 MG tablet Take 2 tablets (40 mg total) by mouth daily. 11/08/21   Particia Nearing, PA-C    Family History Family History  Problem Relation Age of Onset   Hypertension Mother    Heart  disease Mother    Cancer Father        throat   Pancreatic cancer Father    Congestive Heart Failure Brother    Heart disease Maternal Grandmother    Hypertension Maternal Grandmother    Cancer Maternal Grandfather    Hypertension Maternal Grandfather    Cancer Paternal Grandmother        breast   Breast cancer Maternal Aunt     Social History Social History   Tobacco Use   Smoking status: Never   Smokeless tobacco: Never  Vaping Use   Vaping Use: Never used  Substance Use Topics   Alcohol use: Not Currently   Drug use: No     Allergies   Bee venom, Latex, and Penicillins   Review of Systems Review of Systems Per HPI  Physical Exam Triage Vital Signs ED Triage Vitals  Enc Vitals Group     BP 11/08/21 1513 127/85     Pulse Rate 11/08/21 1513 93     Resp 11/08/21 1513 18     Temp 11/08/21 1513 98.7 F (37.1 C)     Temp Source 11/08/21 1513 Oral     SpO2 11/08/21 1513 97 %     Weight --       Height --      Head Circumference --      Peak Flow --      Pain Score 11/08/21 1512 0     Pain Loc --      Pain Edu? --      Excl. in GC? --    No data found.  Updated Vital Signs BP 127/85 (BP Location: Right Arm)    Pulse 93    Temp 98.7 F (37.1 C) (Oral)    Resp 18    LMP 10/29/2021 (Approximate)    SpO2 97%   Visual Acuity Right Eye Distance:   Left Eye Distance:   Bilateral Distance:    Right Eye Near:   Left Eye Near:    Bilateral Near:     Physical Exam Vitals and nursing note reviewed.  Constitutional:      Appearance: Normal appearance. She is not ill-appearing.  HENT:     Head: Atraumatic.     Mouth/Throat:     Mouth: Mucous membranes are moist.  Eyes:     Extraocular Movements: Extraocular movements intact.     Conjunctiva/sclera: Conjunctivae normal.  Cardiovascular:     Rate and Rhythm: Normal rate and regular rhythm.     Heart sounds: Normal heart sounds.  Pulmonary:     Effort: Pulmonary effort is normal.     Breath sounds: Normal breath sounds.  Musculoskeletal:        General: Normal range of motion.     Cervical back: Normal range of motion and neck supple.  Skin:    General: Skin is warm and dry.  Neurological:     General: No focal deficit present.     Mental Status: She is alert and oriented to person, place, and time.     Cranial Nerves: No cranial nerve deficit.     Motor: No weakness.  Psychiatric:        Mood and Affect: Mood normal.        Thought Content: Thought content normal.        Judgment: Judgment normal.     UC Treatments / Results  Labs (all labs ordered are listed, but only abnormal results are displayed) Labs Reviewed - No  data to display  EKG   Radiology No results found.  Procedures Procedures (including critical care time)  Medications Ordered in UC Medications - No data to display  Initial Impression / Assessment and Plan / UC Course  I have reviewed the triage vital signs and the nursing  notes.  Pertinent labs & imaging results that were available during my care of the patient were reviewed by me and considered in my medical decision making (see chart for details).     All 3 medications recent to the pharmacy of her choice, work note extended.  Discussed to follow-up with ENT for a recheck.  No red flag findings today and neurologically intact.  Final Clinical Impressions(s) / UC Diagnoses   Final diagnoses:  Vertigo   Discharge Instructions   None    ED Prescriptions     Medication Sig Dispense Auth. Provider   meclizine (ANTIVERT) 25 MG tablet Take 1 tablet (25 mg total) by mouth 3 (three) times daily as needed for dizziness. 30 tablet Particia Nearing, PA-C   ondansetron (ZOFRAN-ODT) 4 MG disintegrating tablet Take 1 tablet (4 mg total) by mouth every 8 (eight) hours as needed for nausea or vomiting. 15 tablet Particia Nearing, [place]   predniSONE (DELTASONE) 20 MG tablet Take 2 tablets (40 mg total) by mouth daily. 10 tablet Particia Nearing, [place]      PDMP not reviewed this encounter.   Particia Nearing, [place] 11/08/21 1549

## 2021-11-08 NOTE — ED Triage Notes (Signed)
Patient states she is still having the dizziness from November 05, 2021.   Patient states she needs a note from her job stating that she is released to come back to work and/or any restrictions

## 2021-11-08 NOTE — ED Provider Notes (Signed)
[facility]   [id number] 11/05/21 Arrival Time: [id number]  ASSESSMENT & PLAN:  1. Vertigo    Normal neurologic exam. No suspicion for ICH or SAH. No indication for neurodiagnostic imaging at this time.   Meds ordered this encounter  Medications   meclizine (ANTIVERT) 25 MG tablet    Sig: Take 1 tablet (25 mg total) by mouth 3 (three) times daily as needed for dizziness.    Dispense:  30 tablet    Refill:  0   predniSONE (DELTASONE) 20 MG tablet    Sig: Take 2 tablets (40 mg total) by mouth daily.    Dispense:  10 tablet    Refill:  0   ondansetron (ZOFRAN-ODT) 4 MG disintegrating tablet    Sig: Take 1 tablet (4 mg total) by mouth every 8 (eight) hours as needed for nausea or vomiting.    Dispense:  15 tablet    Refill:  0   Reassured that these symptoms do not appear to represent a serious or threatening condition. This is generally a self-limited temporary but uncomfortable situation. Rest, avoid potentially dangerous activities (such as driving or working with machinery or at heights). Use OTC Meclizine prn. Will proceed to the ED if she develops other symptoms such as alterations of speech, swallowing, vision, motor/sensory systems, or if dizziness worsens.  Reviewed expectations re: course of current medical issues. Questions answered. Outlined signs and symptoms indicating need for more acute intervention. Patient verbalized understanding. After Visit Summary given.   SUBJECTIVE:  Rachel Stevens is a 41 y.o. female with Meniere's who reports positional vertigo; x past 24 hours; off/on. [facility]su meclizine helps. Requests Rx. No other symptoms except mild nausea without emesis. No recent illnesses. No CP/SOB. No extremity sensation changes or weakness. Normal ambulation.  Social History   Substance and Sexual Activity  Alcohol Use Not Currently   Social History   Tobacco Use  Smoking Status Never  Smokeless Tobacco Never   Denies recreational drug  use.  OBJECTIVE:  Vitals:   11/05/21 1625  BP: 129/81  Pulse: 82  Resp: 18  Temp: 98.5 F (36.9 C)  TempSrc: Oral  SpO2: 99%    General appearance: alert; no distress Eyes: PERRLA; EOMI; conjunctiva normal HENT: normocephalic; atraumatic Neck: supple with FROM Lungs: unlabored Heart: regular Extremities: no cyanosis or edema; symmetrical with no gross deformities Skin: warm and dry Neurologic: normal gait; DTR's normal and symmetric; CN 2-12 grossly intact; rapid changes in position during the exam do precipitate brief verigo Psychological: alert and cooperative; normal mood and affect   Allergies  Allergen Reactions   Bee Venom Other (See Comments)    blisters   Latex Swelling   Penicillins Hives    .Has patient had a PCN reaction causing immediate rash, facial/tongue/throat swelling, SOB or lightheadedness with hypotension: Yes Has patient had a PCN reaction causing severe rash involving mucus membranes or skin necrosis: No Has patient had a PCN reaction that required hospitalization: No Has patient had a PCN reaction occurring within the last 10 years: No If all of the above answers are "NO", then may proceed with Cephalosporin use.     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   Irregular periods 01/31/2015   Missed period 11/01/2014   Patient desires pregnancy 11/01/2014   Pelvic floor tension    Vaginal discharge 11/01/2014   Vulvar pain  Weight gain 11/01/2014   Social History   Socioeconomic History   Marital status: Divorced    Spouse name: Not on file   Number of children: Not on file   Years of education: Not on file   Highest education level: Not on file  Occupational History   Not on file  Tobacco Use   Smoking status: Never   Smokeless tobacco: Never  Vaping Use   Vaping Use: Never used  Substance and Sexual Activity   Alcohol use: Not Currently    Drug use: No   Sexual activity: Yes    Partners: Male  Other Topics Concern   Not on file  Social History Narrative   Not on file   Social Determinants of Health   Financial Resource Strain: Not on file  Food Insecurity: Not on file  Transportation Needs: Not on file  Physical Activity: Not on file  Stress: Not on file  Social Connections: Not on file  Intimate Partner Violence: Not on file   Family History  Problem Relation Age of Onset   Hypertension Mother    Heart disease Mother    Cancer Father        throat   Pancreatic cancer Father    Congestive Heart Failure Brother    Heart disease Maternal Grandmother    Hypertension Maternal Grandmother    Cancer Maternal Grandfather    Hypertension Maternal Grandfather    Cancer Paternal Grandmother        breast   Breast cancer Maternal Aunt    Past Surgical History:  Procedure Laterality Date   REDUCTION MAMMAPLASTY Right        Vanessa Kick, MD 11/08/21 1601

## 2021-12-29 ENCOUNTER — Encounter (HOSPITAL_COMMUNITY): Payer: Self-pay | Admitting: *Deleted

## 2021-12-29 ENCOUNTER — Emergency Department (HOSPITAL_COMMUNITY)
Admission: EM | Admit: 2021-12-29 | Discharge: 2021-12-29 | Disposition: A | Discharge status: Home / Self Care | Payer: BC Managed Care – PPO | Attending: Emergency Medicine | Admitting: Emergency Medicine

## 2021-12-29 DIAGNOSIS — Z91040 Latex allergy status: Secondary | ICD-10-CM | POA: Diagnosis not present

## 2021-12-29 DIAGNOSIS — R42 Dizziness and giddiness: Secondary | ICD-10-CM | POA: Diagnosis present

## 2021-12-29 HISTORY — DX: Meniere's disease, unspecified ear: H81.09

## 2021-12-29 MED ORDER — PREDNISONE 20 MG PO TABS: 40.0000 mg | ORAL_TABLET | Freq: Every day | ORAL | 0 refills | Status: DC

## 2021-12-29 NOTE — ED Provider Notes (Signed)
?[place] [place] [facility] EMERGENC[facility] DEPARTMENT ?Provider Note ? ? ?CSN: [id number] ?Arrival date & time: 12/29/21  1422 ? ?  ? ?History ? ?Chief Complaint  ?Patient presents with  ? Near Syncope  ? ? ?Rachel Stevens is a 41 y.o. female. ? ? ?Near Syncope ? ? 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  Happened today at work, she felt like the room was spinning.  She took meclizine and the symptoms resolved.  States this is happened previously, no obvious triggers that she can identify.  She has not been able to see ENT, has an appointment May 5.  Denies any chest pain or shortness of breath. ? ?Home Medications ?Prior to Admission medications   ?Medication Sig Start Date End Date Taking? Authorizing Provider  ?desipramine (NORPRAMIN) 25 MG tablet Take by mouth. 07/04/20 07/04/21  [provider]  ?meclizine (ANTIVERT) 25 MG tablet Take 1 tablet (25 mg total) by mouth 3 (three) times daily as needed for dizziness. 11/08/21   Particia Nearing, PA-C  ?ondansetron (ZOFRAN-ODT) 4 MG disintegrating tablet Take 1 tablet (4 mg total) by mouth every 8 (eight) hours as needed for nausea or vomiting. 11/08/21   Particia Nearing, PA-C  ?predniSONE (DELTASONE) 20 MG tablet Take 2 tablets (40 mg total) by mouth daily. 12/29/21   Theron Arista, PA-C  ?   ? ?Allergies    ?Bee venom, Latex, and Penicillins   ? ?Review of Systems   ?Review of Systems  ?Cardiovascular:  Positive for near-syncope.  ? ?Physical Exam ?Updated Vital Signs ?BP 134/86 (BP Location: Right Arm)   Pulse 91   Temp 99 ?F (37.2 ?C) (Oral)   Resp 18   Ht 5\' 6"  (1.676 m)   Wt 86.2 kg   LMP 12/16/2021   SpO2 98%   BMI 30.67 kg/m?  ?Physical Exam ?Vitals and nursing note reviewed. Exam conducted with a chaperone present.  ?Constitutional:   ?   Appearance: Normal appearance.  ?HENT:  ?   Head: Normocephalic and atraumatic.  ?Eyes:  ?   General: No scleral icterus.    ?   Right eye: No discharge.     ?    Left eye: No discharge.  ?   Extraocular Movements: Extraocular movements intact.  ?   Pupils: Pupils are equal, round, and reactive to light.  ?Cardiovascular:  ?   Rate and Rhythm: Normal rate and regular rhythm.  ?   Pulses: Normal pulses.  ?   Heart sounds: Normal heart sounds. No murmur heard. ?  No friction rub. No gallop.  ?Pulmonary:  ?   Effort: Pulmonary effort is normal. No respiratory distress.  ?   Breath sounds: Normal breath sounds.  ?Abdominal:  ?   General: Abdomen is flat. Bowel sounds are normal. There is no distension.  ?   Palpations: Abdomen is soft.  ?   Tenderness: There is no abdominal tenderness.  ?Skin: ?   General: Skin is warm and dry.  ?   Coloration: Skin is not jaundiced.  ?Neurological:  ?   General: No focal deficit present.  ?   Mental Status: She is alert. Mental status is at baseline.  ?   Motor: No weakness.  ?   Coordination: Coordination normal.  ? ? ?ED Results / Procedures / Treatments   ?Labs ?(all labs ordered are listed, but only abnormal results are displayed) ?Labs Reviewed - No data to display ? ?EKG ?None ? ?  Radiology ?No results found. ? ?Procedures ?Procedures  ? ? ?Medications Ordered in ED ?Medications - No data to display ? ?ED Course/ Medical Decision Making/ A&P ?  ?                        ?Medical Decision Making ?Risk ?Prescription drug management. ? ? ?This patient presents to the ED for concern of presyncope, this involves an extensive number of treatment options, and is a complaint that carries with it a high risk of complications and morbidity.  The differential diagnosis includes but not limited to vasovagal, arrhythmia, vertigo ? ? ?Additional history obtained:  ? ?Reviewed records from her internal medicine doctor, known history of many years and typically her symptoms improved with meclizine.  Has not been evaluated by ENT yet. ? ? ?Medicines ordered and prescription drug management: ? ?I have reviewed the patients home medicines and have made  adjustments as needed ? ? ?Test Considered: ? ?Considered labs and imaging but patient presentation is very classic and there is no new features.  No focal deficits on neuro exam, regular rhythm. ? ?  ?Consultations Obtained: ? ?I requested consultation with the Dr. Elijah Birk with ENT.  Discussed lab and imaging findings as well as pertinent plan - they recommend: Continuing the meclizine, steroid burst and having her follow-up with ENT. ? ? ?Reevaluation: ? ?After the interventions noted above, I reevaluated the patient and found patient is symptom-free. ? ? 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  She is symptom-free, no new symptoms or deficits on exam.  Will discharge with prednisone burst and ENT follow-up options that may be able to see her before May.  Return precautions given, discharged in stable condition. ?  ?Social Determinants of Health: ? ?  ?Disposition: ? ? ?After consideration of the diagnostic results and the patients response to treatment, I feel that the patent would benefit from ENT F/U outpatient. ? ? ? ? ? ? ? ? ?Final Clinical Impression(s) / ED Diagnoses ?Final diagnoses:  ?Vertigo  ? ? ?Rx / DC Orders ?ED Discharge Orders   ? ?      Ordered  ?  predniSONE (DELTASONE) 20 MG tablet  Daily       ? 12/29/21 1548  ? ?  ?  ? ?  ? ? ?  ?Theron Arista, PA-C ?12/29/21 1549 ? ?  ?Mancel Bale, MD ?12/30/21 1143 ? ?

## 2021-12-29 NOTE — ED Triage Notes (Signed)
Dizziness history of vertigo, states she got dizzy at work prior to arrival ?

## 2022-01-08 ENCOUNTER — Other Ambulatory Visit: Payer: Self-pay

## 2022-01-08 ENCOUNTER — Encounter: Payer: Self-pay | Admitting: Emergency Medicine

## 2022-01-08 ENCOUNTER — Ambulatory Visit
Admission: EM | Admit: 2022-01-08 | Discharge: 2022-01-08 | Disposition: A | Discharge status: Home / Self Care | Payer: BC Managed Care – PPO | Attending: Family Medicine | Admitting: Family Medicine

## 2022-01-08 DIAGNOSIS — R42 Dizziness and giddiness: Secondary | ICD-10-CM | POA: Insufficient documentation

## 2022-01-08 DIAGNOSIS — N898 Other specified noninflammatory disorders of vagina: Secondary | ICD-10-CM | POA: Insufficient documentation

## 2022-01-08 NOTE — ED Triage Notes (Addendum)
Pt reports history of same for last several years. Pt reports most recent flare-up of dizziness started today approximately 11am. Pt reports has appointment with ENT on April 25th. Pt reports each episode seems to get progressively worse.  ? ?Pt denies any speech issues, extremity numbness. Pt alert and oriented. Steady gait.  ? ?Pt reports was supposed to be seen at health department for evaluation of BV at 230 and reports would like to be tested at urgent care as missed health department appointment.  ? ?Pt denies wanting to be tested for STD but reports BV and yeast only with swab. ?

## 2022-01-08 NOTE — ED Provider Notes (Signed)
?[facility] ? ? ? ?CSN: [id number] ?Arrival date & time: 01/08/22  1403 ? ? ?  ? ?History   ?Chief Complaint ?Chief Complaint  ?Patient presents with  ? Dizziness  ? ? ?HPI ?Rachel Stevens is a 41 y.o. female.  ? ?Presenting today with several year history of intermittent episodes of vertigo that have progressively been getting worse.  She has been seen numerous times for this and is set to see ear nose and throat on April 25.  Has been taking meclizine with mild occasional relief.  Had an episode this morning at work this started about 11 AM but has lessened a bit at the current time.  Denies severe headache, blurry vision, nausea, vomiting, syncope, chest pain, shortness of breath.  Needing a note for work.  She is also requesting a vaginal swab as she has been having discharge and had an appointment earlier today at the health department to check for BV but missed it due to coming here.  Denies vaginal itching, irritation, dysuria, hematuria, concern for STIs or pregnancy.  LMP 12/30/2021. ? ?Past Medical History:  ?Diagnosis Date  ? Chronic pelvic pain in female   ? Herpes   ? 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 Irregular periods 01/31/2015  ? Meniere disease   ? Missed period 11/01/2014  ? Patient desires pregnancy 11/01/2014  ? Pelvic floor tension   ? Vaginal discharge 11/01/2014  ? Vulvar pain   ? Weight gain 11/01/2014  ? ?Patient Active Problem List  ? Diagnosis Date Noted  ? Anxiety 08/19/2020  ? Breast mass in female 03/04/2020  ? Elevated blood pressure reading 02/21/2020  ? Obesity BMI=30.9  160 lbs 07/05/2019  ? Pelvic "burning" x3 years 07/05/2019  ? History of reduction surgery of right breast 2009 07/05/2019  ? Depression 07/05/2015  ? Dizziness of unknown cause 07/05/2015  ? Fatigue 07/05/2015  ? Vitamin B12 deficiency 07/05/2015  ? Vitamin D insufficiency 07/05/2015  ? Vulvar pain 07/05/2015  ? Irregular periods 01/31/2015  ? HSV-2 (herpes simplex virus 2) infection  11/15/2014  ? Female infertility 07/15/2011  ? ?Past Surgical History:  ?Procedure Laterality Date  ? REDUCTION MAMMAPLASTY Right   ? ?OB History   ? ? Gravida  ?0  ? Para  ?   ? Term  ?   ? Preterm  ?   ? AB  ?   ? Living  ?   ?  ? ? SAB  ?   ? IAB  ?   ? Ectopic  ?   ? Multiple  ?   ? Live Births  ?   ?   ?  ?  ? ?Home Medications   ? ?Prior to Admission medications   ?Medication Sig Start Date End Date Taking? Authorizing Provider  ?desipramine (NORPRAMIN) 25 MG tablet Take by mouth. 07/04/20 07/04/21  [provider]  ?meclizine (ANTIVERT) 25 MG tablet Take 1 tablet (25 mg total) by mouth 3 (three) times daily as needed for dizziness. 11/08/21   Particia Nearing, PA-C  ?ondansetron (ZOFRAN-ODT) 4 MG disintegrating tablet Take 1 tablet (4 mg total) by mouth every 8 (eight) hours as needed for nausea or vomiting. 11/08/21   Particia Nearing, PA-C  ?predniSONE (DELTASONE) 20 MG tablet Take 2 tablets (40 mg total) by mouth daily. 12/29/21   Theron Arista, PA-C  ? ?Family History ?Family History  ?Problem Relation Age of Onset  ? Hypertension Mother   ? Heart  disease Mother   ? Cancer Father   ?     throat  ? Pancreatic cancer Father   ? Congestive Heart Failure Brother   ? Heart disease Maternal Grandmother   ? Hypertension Maternal Grandmother   ? Cancer Maternal Grandfather   ? Hypertension Maternal Grandfather   ? Cancer Paternal Grandmother   ?     breast  ? Breast cancer Maternal Aunt   ? ?Social History ?Social History  ? ?Tobacco Use  ? Smoking status: Never  ? Smokeless tobacco: Never  ?Vaping Use  ? Vaping Use: Never used  ?Substance Use Topics  ? Alcohol use: Not Currently  ? Drug use: No  ? ?Allergies   ?Bee venom, Latex, and Penicillins ? ? ?Review of Systems ?Review of Systems ?Per HPI ? ?Physical Exam ?Triage Vital Signs ?ED Triage Vitals  ?Enc Vitals Group  ?   BP 01/08/22 1438 (!) 141/88  ?   Pulse Rate 01/08/22 1438 87  ?   Resp 01/08/22 1438 18  ?   Temp 01/08/22 1438 99.5 ?F (37.5 ?C)   ?   Temp Source 01/08/22 1438 Oral  ?   SpO2 01/08/22 1438 98 %  ?   Weight 01/08/22 1439 190 lb (86.2 kg)  ?   Height 01/08/22 1439 5\' 6"  (1.676 m)  ?   Head Circumference --   ?   Peak Flow --   ?   Pain Score 01/08/22 1439 0  ?   Pain Loc --   ?   Pain Edu? --   ?   Excl. in GC? --   ? ?No data found. ? ?Updated Vital Signs ?BP (!) 141/88 (BP Location: Right Arm)   Pulse 87   Temp 99.5 ?F (37.5 ?C) (Oral)   Resp 18   Ht 5\' 6"  (1.676 m)   Wt 190 lb (86.2 kg)   LMP 12/30/2021 (Approximate)   SpO2 98%   BMI 30.67 kg/m?  ? ?Visual Acuity ?Right Eye Distance:   ?Left Eye Distance:   ?Bilateral Distance:   ? ?Right Eye Near:   ?Left Eye Near:    ?Bilateral Near:    ? ?Physical Exam ?Vitals and nursing note reviewed.  ?Constitutional:   ?   Appearance: Normal appearance. She is not ill-appearing.  ?HENT:  ?   Head: Atraumatic.  ?   Mouth/Throat:  ?   Mouth: Mucous membranes are moist.  ?   Pharynx: Oropharynx is clear.  ?Eyes:  ?   Extraocular Movements: Extraocular movements intact.  ?   Conjunctiva/sclera: Conjunctivae normal.  ?Cardiovascular:  ?   Rate and Rhythm: Normal rate and regular rhythm.  ?   Heart sounds: Normal heart sounds.  ?Pulmonary:  ?   Effort: Pulmonary effort is normal.  ?   Breath sounds: Normal breath sounds. No wheezing or rales.  ?Genitourinary: ?   Comments: GU exam deferred, self swab performed ?Musculoskeletal:     ?   General: Normal range of motion.  ?   Cervical back: Normal range of motion and neck supple.  ?Skin: ?   General: Skin is warm and dry.  ?Neurological:  ?   General: No focal deficit present.  ?   Mental Status: She is alert and oriented to person, place, and time.  ?   Motor: No weakness.  ?   Gait: Gait normal.  ?Psychiatric:     ?   Mood and Affect: Mood normal.     ?  Thought Content: Thought content normal.     ?   Judgment: Judgment normal.  ? ?UC Treatments / Results  ?Labs ?(all labs ordered are listed, but only abnormal results are displayed) ?Labs Reviewed   ?CERVICOVAGINAL ANCILLARY ONLY  ? ? ?EKG ? ? ?Radiology ?No results found. ? ?Procedures ?Procedures (including critical care time) ? ?Medications Ordered in UC ?Medications - No data to display ? ?Initial Impression / Assessment and Plan / UC Course  ?I have reviewed the triage vital signs and the nursing notes. ? ?Pertinent labs & imaging results that were available during my care of the patient were reviewed by me and considered in my medical decision making (see chart for details). ? ?  ? ?Continue as needed meclizine, Epley maneuvers and await ENT follow-up later this month.  Work note given.  ED for any worsening symptoms.  No acute focal deficits on neurologic exam today.  Vaginal swab pending, treat as needed based on results.  Good vaginal hygiene and safe sexual practices reviewed. ? ?Final Clinical Impressions(s) / UC Diagnoses  ? ?Final diagnoses:  ?Vertigo  ?Vaginal discharge  ? ?Discharge Instructions   ?None ?  ? ?ED Prescriptions   ?None ?  ? ?PDMP not reviewed this encounter. ?  ?Particia Nearing, PA-C ?01/08/22 1617 ? ?

## 2022-01-09 ENCOUNTER — Telehealth (HOSPITAL_COMMUNITY): Payer: Self-pay | Admitting: Emergency Medicine

## 2022-01-09 ENCOUNTER — Telehealth: Payer: Self-pay | Admitting: Urgent Care

## 2022-01-09 LAB — CERVICOVAGINAL ANCILLARY ONLY
Bacterial Vaginitis (gardnerella): POSITIVE — AB
Candida Glabrata: NEGATIVE
Candida Vaginitis: NEGATIVE
Comment: NEGATIVE
Comment: NEGATIVE
Comment: NEGATIVE

## 2022-01-09 MED ORDER — METRONIDAZOLE 0.75 % VA GEL: 1.0000 | Freq: Every day | VAGINAL | 0 refills | Status: DC

## 2022-01-09 MED ORDER — METRONIDAZOLE 500 MG PO TABS: 500.0000 mg | ORAL_TABLET | Freq: Two times a day (BID) | ORAL | 0 refills | Status: DC

## 2022-01-09 NOTE — Telephone Encounter (Signed)
Patient called requesting MetroGel instead of metronidazole orally.  Prescription was sent for the MetroGel. ?

## 2022-01-18 ENCOUNTER — Emergency Department (HOSPITAL_COMMUNITY)
Admission: EM | Admit: 2022-01-18 | Discharge: 2022-01-18 | Disposition: A | Discharge status: Home / Self Care | Payer: BC Managed Care – PPO | Attending: Emergency Medicine | Admitting: Emergency Medicine

## 2022-01-18 ENCOUNTER — Encounter (HOSPITAL_COMMUNITY): Payer: Self-pay

## 2022-01-18 ENCOUNTER — Other Ambulatory Visit: Payer: Self-pay

## 2022-01-18 DIAGNOSIS — D649 Anemia, unspecified: Secondary | ICD-10-CM | POA: Insufficient documentation

## 2022-01-18 DIAGNOSIS — R42 Dizziness and giddiness: Secondary | ICD-10-CM | POA: Diagnosis not present

## 2022-01-18 DIAGNOSIS — Z91040 Latex allergy status: Secondary | ICD-10-CM | POA: Insufficient documentation

## 2022-01-18 DIAGNOSIS — K625 Hemorrhage of anus and rectum: Secondary | ICD-10-CM | POA: Diagnosis not present

## 2022-01-18 LAB — CBC WITH DIFFERENTIAL/PLATELET
Abs Immature Granulocytes: 0.03 10*3/uL (ref 0.00–0.07)
Basophils Absolute: 0 10*3/uL (ref 0.0–0.1)
Basophils Relative: 0 %
Eosinophils Absolute: 0.1 10*3/uL (ref 0.0–0.5)
Eosinophils Relative: 1 %
HCT: 36.2 % (ref 36.0–46.0)
Hemoglobin: 11.6 g/dL — ABNORMAL LOW (ref 12.0–15.0)
Immature Granulocytes: 0 %
Lymphocytes Relative: 34 %
Lymphs Abs: 3.1 10*3/uL (ref 0.7–4.0)
MCH: 28.5 pg (ref 26.0–34.0)
MCHC: 32 g/dL (ref 30.0–36.0)
MCV: 88.9 fL (ref 80.0–100.0)
Monocytes Absolute: 0.7 10*3/uL (ref 0.1–1.0)
Monocytes Relative: 8 %
Neutro Abs: 5.2 10*3/uL (ref 1.7–7.7)
Neutrophils Relative %: 57 %
Platelets: 264 10*3/uL (ref 150–400)
RBC: 4.07 MIL/uL (ref 3.87–5.11)
RDW: 13.2 % (ref 11.5–15.5)
WBC: 9.1 10*3/uL (ref 4.0–10.5)
nRBC: 0 % (ref 0.0–0.2)

## 2022-01-18 LAB — BASIC METABOLIC PANEL
Anion gap: 4 — ABNORMAL LOW (ref 5–15)
BUN: 9 mg/dL (ref 6–20)
CO2: 27 mmol/L (ref 22–32)
Calcium: 8.6 mg/dL — ABNORMAL LOW (ref 8.9–10.3)
Chloride: 106 mmol/L (ref 98–111)
Creatinine, Ser: 0.8 mg/dL (ref 0.44–1.00)
GFR, Estimated: >60 mL/min (ref >60)
Glucose, Bld: 90 mg/dL (ref 70–99)
Potassium: 4 mmol/L (ref 3.5–5.1)
Sodium: 137 mmol/L (ref 135–145)

## 2022-01-18 LAB — HCG, SERUM, QUALITATIVE: Preg, Serum: NEGATIVE

## 2022-01-18 NOTE — ED Provider Notes (Signed)
?[facility] ?Provider Note ? ? ?CSN: [id number] ?Arrival date & time: 01/18/22  1919 ? ?  ? ?History ? ?Chief Complaint  ?Patient presents with  ? Rectal Bleeding  ? ? ?Rachel Stevens is a 41 y.o. female.  She is here with a complaint of an episode of rectal bleeding last night.  She said it occurred after she had a hard bowel movement.  There was some drops of red blood in the bowl.  No rectal pain or abdominal pain.  No prior history of same.  Has not had another bowel movement and has not seen any more bleeding.  She is not on blood thinners.  She wonders if it has to do with the metronidazole that she is taking for BV.  It was causing her an upset stomach and nausea.  She denies any use of alcohol with it.  It also caused her vertigo to get worse. ? ? ?Rectal Bleeding ?Quality:  Bright red ?Amount:  Scant ?Chronicity:  New ?Context: constipation and defecation   ?Similar prior episodes: no   ?Relieved by:  None tried ?Worsened by:  Defecation ?Ineffective treatments:  None tried ?Associated symptoms: dizziness   ?Associated symptoms: no abdominal pain, no epistaxis, no fever, no hematemesis, no light-headedness and no vomiting   ?Risk factors: no anticoagulant use   ? ?  ? ?Home Medications ?Prior to Admission medications   ?Medication Sig Start Date End Date Taking? Authorizing Provider  ?desipramine (NORPRAMIN) 25 MG tablet Take by mouth. 07/04/20 07/04/21  [provider]  ?meclizine (ANTIVERT) 25 MG tablet Take 1 tablet (25 mg total) by mouth 3 (three) times daily as needed for dizziness. 11/08/21   Particia Nearing, PA-C  ?metroNIDAZOLE (METROGEL VAGINAL) 0.75 % vaginal gel Place 1 Applicatorful vaginally at bedtime. 01/09/22   Wallis Bamberg, PA-C  ?ondansetron (ZOFRAN-ODT) 4 MG disintegrating tablet Take 1 tablet (4 mg total) by mouth every 8 (eight) hours as needed for nausea or vomiting. 11/08/21   Particia Nearing, PA-C  ?predniSONE (DELTASONE) 20 MG tablet Take 2  tablets (40 mg total) by mouth daily. 12/29/21   Theron Arista, PA-C  ?   ? ?Allergies    ?Bee venom, Latex, and Penicillins   ? ?Review of Systems   ?Review of Systems  ?Constitutional:  Negative for fever.  ?HENT:  Negative for nosebleeds.   ?Gastrointestinal:  Positive for anal bleeding and hematochezia. Negative for abdominal pain, hematemesis, rectal pain and vomiting.  ?Neurological:  Positive for dizziness. Negative for light-headedness.  ? ?Physical Exam ?Updated Vital Signs ?BP (!) 138/93 (BP Location: Right Arm)   Pulse 65   Temp 98.2 ?F (36.8 ?C) (Oral)   Resp 14   Ht 5\' 6"  (1.676 m)   Wt 83.9 kg   LMP 01/14/2022 (Approximate)   SpO2 100%   BMI 29.86 kg/m?  ?Physical Exam ?Vitals and nursing note reviewed.  ?Constitutional:   ?   General: She is not in acute distress. ?   Appearance: Normal appearance. She is well-developed.  ?HENT:  ?   Head: Normocephalic and atraumatic.  ?Eyes:  ?   Conjunctiva/sclera: Conjunctivae normal.  ?Cardiovascular:  ?   Rate and Rhythm: Normal rate and regular rhythm.  ?   Heart sounds: No murmur heard. ?Pulmonary:  ?   Effort: Pulmonary effort is normal. No respiratory distress.  ?   Breath sounds: Normal breath sounds.  ?Abdominal:  ?   Palpations: Abdomen is soft.  ?  Tenderness: There is no abdominal tenderness. There is no guarding or rebound.  ?Musculoskeletal:     ?   General: No swelling.  ?   Cervical back: Neck supple.  ?Skin: ?   General: Skin is warm and dry.  ?   Capillary Refill: Capillary refill takes less than 2 seconds.  ?Neurological:  ?   General: No focal deficit present.  ?   Mental Status: She is alert.  ? ? ?ED Results / Procedures / Treatments   ?Labs ?(all labs ordered are listed, but only abnormal results are displayed) ?Labs Reviewed  ?BASIC METABOLIC PANEL - Abnormal; Notable for the following components:  ?    Result Value  ? Calcium 8.6 (*)   ? Anion gap 4 (*)   ? All other components within normal limits  ?CBC WITH DIFFERENTIAL/PLATELET -  Abnormal; Notable for the following components:  ? Hemoglobin 11.6 (*)   ? All other components within normal limits  ?HCG, SERUM, QUALITATIVE  ? ? ?EKG ?None ? ?Radiology ?No results found. ? ?Procedures ?Procedures  ? ? ?Medications Ordered in ED ?Medications - No data to display ? ?ED Course/ Medical Decision Making/ A&P ?Clinical Course as of 01/19/22 1112  ?Wynelle Link Jan 18, 2022  ?2218 Patient has had no further episodes of rectal bleeding here.  Hemoglobin slightly lower than baseline.  Normal BUN and creatinine so doubt significant bleed.  She is comfortable plan for discharge and outpatient follow-up with her treating provider.  Return instructions discussed [MB]  ?  ?Clinical Course User Index ?[MB] Terrilee Files, MD  ? ?                        ?Medical Decision Making ?Amount and/or Complexity of Data Reviewed ?Labs: ordered. ? ?This patient complains of rectal bleeding; this involves an extensive number of treatment ?Options and is a complaint that carries with it a high risk of complications and ?morbidity. The differential includes hemorrhoidal bleeding, AVM, diverticulosis, anemia ? ?I ordered, reviewed and interpreted labs, which included CBC with normal white count, hemoglobin slightly lower than priors, chemistries normal with normal BUN and creatinine, pregnancy test negative ?Previous records obtained and reviewed in epic no recent admissions ?Social determinants considered, no significant barriers ?Critical Interventions: None ? ?After the interventions stated above, I reevaluated the patient and found patient to be asymptomatic ?Admission and further testing considered, no indications for further work-up or admission at this time.  Patient comfortable plan for outpatient follow-up with her treating provider.  Return instructions discussed ? ? ? ? ? ? ? ? ? ?Final Clinical Impression(s) / ED Diagnoses ?Final diagnoses:  ?Rectal bleeding  ? ? ?Rx / DC Orders ?ED Discharge Orders   ? ? None  ? ?   ? ? ?  ?Terrilee Files, MD ?01/19/22 1114 ? ?

## 2022-01-18 NOTE — Discharge Instructions (Addendum)
You were seen in the emergency department for rectal bleeding.  Your lab work showed you to be slightly anemic but not significant.  Please increase your fiber intake and fluids.  Avoid straining on the toilet.  Follow-up with your regular doctor.  Return to the emergency department if any worsening or concerning symptoms ?

## 2022-01-18 NOTE — ED Triage Notes (Signed)
Pt c/o bloody stools that started last night, reports seeing bright red blood in her stools. Has had approx. 2 episodes. Also c/o headache and abdominal pain.  ?

## 2022-01-19 ENCOUNTER — Telehealth: Payer: Self-pay

## 2022-01-19 MED ORDER — FLUCONAZOLE 150 MG PO TABS: ORAL_TABLET | ORAL | 0 refills | Status: DC

## 2022-02-18 ENCOUNTER — Ambulatory Visit
Admission: EM | Admit: 2022-02-18 | Discharge: 2022-02-18 | Disposition: A | Discharge status: Home / Self Care | Payer: BC Managed Care – PPO | Attending: Family Medicine | Admitting: Family Medicine

## 2022-02-18 ENCOUNTER — Other Ambulatory Visit: Payer: Self-pay

## 2022-02-18 ENCOUNTER — Encounter: Payer: Self-pay | Admitting: Emergency Medicine

## 2022-02-18 DIAGNOSIS — R350 Frequency of micturition: Secondary | ICD-10-CM | POA: Diagnosis not present

## 2022-02-18 DIAGNOSIS — N760 Acute vaginitis: Secondary | ICD-10-CM | POA: Insufficient documentation

## 2022-02-18 DIAGNOSIS — R102 Pelvic and perineal pain: Secondary | ICD-10-CM | POA: Diagnosis present

## 2022-02-18 DIAGNOSIS — Z8619 Personal history of other infectious and parasitic diseases: Secondary | ICD-10-CM | POA: Insufficient documentation

## 2022-02-18 LAB — POCT URINALYSIS DIP (MANUAL ENTRY)
Bilirubin, UA: NEGATIVE
Blood, UA: NEGATIVE
Glucose, UA: NEGATIVE mg/dL
Ketones, POC UA: NEGATIVE mg/dL
Leukocytes, UA: NEGATIVE
Nitrite, UA: NEGATIVE
Protein Ur, POC: NEGATIVE mg/dL
Spec Grav, UA: 1.015 (ref 1.010–1.025)
Urobilinogen, UA: 0.2 E.U./dL
pH, UA: 7 (ref 5.0–8.0)

## 2022-02-18 MED ORDER — KETOROLAC TROMETHAMINE 30 MG/ML IJ SOLN
30.0000 mg | Freq: Once | INTRAMUSCULAR | Status: AC
Start: 2022-02-18 — End: 2022-02-18
  Administered 2022-02-18: 30 mg via INTRAMUSCULAR

## 2022-02-18 MED ORDER — METRONIDAZOLE 0.75 % VA GEL: 1.0000 | Freq: Every day | VAGINAL | 0 refills | Status: DC

## 2022-02-18 NOTE — ED Triage Notes (Signed)
Pt reports right sided lower back pain that radiates to right lower abdomen and bladder. Pt reports symptoms started after intercourse a few days ago. Pt denies any fevers, urinary frequency or dysuria, vaginal discharge.

## 2022-02-18 NOTE — ED Provider Notes (Addendum)
[facility]    CSN: [id number] Arrival date & time: 02/18/22  1751      History   Chief Complaint Chief Complaint  Patient presents with   Back Pain    HPI Rachel Stevens is a 41 y.o. female.   Presenting today with several day history of suprapubic pressure and pain, urinary frequency, vaginal irritation.  Denies fever, chills, nausea, vomiting, bowel changes, vaginal discharge or odor.  Symptoms started after intercourse several days ago.  She notes she has a history of frequent BV infections that seem to brought on by intercourse.  Has not been trying thing over-the-counter for symptoms.   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   Irregular periods 01/31/2015   Meniere disease    Missed period 11/01/2014   Patient desires pregnancy 11/01/2014   Pelvic floor tension    Vaginal discharge 11/01/2014   Vulvar pain    Weight gain 11/01/2014    Patient Active Problem List   Diagnosis Date Noted   Anxiety 08/19/2020   Breast mass in female 03/04/2020   Elevated blood pressure reading 02/21/2020   Obesity BMI=30.9  160 lbs 07/05/2019   Pelvic "burning" x3 years 07/05/2019   History of reduction surgery of right breast 2009 07/05/2019   Depression 07/05/2015   Dizziness of unknown cause 07/05/2015   Fatigue 07/05/2015   Vitamin B12 deficiency 07/05/2015   Vitamin D insufficiency 07/05/2015   Vulvar pain 07/05/2015   Irregular periods 01/31/2015   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 Authorizing Provider  desipramine (NORPRAMIN) 25 MG tablet Take by mouth. 07/04/20 07/04/21  [provider]  fluconazole (DIFLUCAN) 150 MG tablet Take one tablet now and next tablet in 72 hrs if no relief 01/19/22   Leath-Warren, Sadie Haberhristie J, NP  meclizine (ANTIVERT) 25 MG tablet Take 1 tablet (25 mg total) by mouth 3 (three) times daily as needed for dizziness. 11/08/21   Particia NearingLane, Elvyn Krohn Elizabeth, PA-C  metroNIDAZOLE (METROGEL VAGINAL) 0.75 % vaginal gel Place 1 Applicatorful vaginally at bedtime. 02/18/22   Particia NearingLane, Delissa Silba Elizabeth, PA-C  ondansetron (ZOFRAN-ODT) 4 MG disintegrating tablet Take 1 tablet (4 mg total) by mouth every 8 (eight) hours as needed for nausea or vomiting. 11/08/21   Particia NearingLane, Advith Martine Elizabeth, PA-C  predniSONE (DELTASONE) 20 MG tablet Take 2 tablets (40 mg total) by mouth daily. 12/29/21   Theron AristaSage, Haley, PA-C    Family History Family History  Problem Relation Age of Onset   Hypertension Mother    Heart disease Mother    Cancer Father        throat   Pancreatic cancer Father  Congestive Heart Failure Brother    Heart disease Maternal Grandmother    Hypertension Maternal Grandmother    Cancer Maternal Grandfather    Hypertension Maternal Grandfather    Cancer Paternal Grandmother        breast   Breast cancer Maternal Aunt     Social History Social History   Tobacco Use   Smoking status: Never   Smokeless tobacco: Never  Vaping Use   Vaping Use: Never used  Substance Use Topics   Alcohol use: Not Currently   Drug use: No     Allergies   Bee venom, Latex, and Penicillins   Review of Systems Review of Systems Per HPI  Physical Exam Triage Vital Signs ED Triage Vitals  Enc Vitals Group     BP 02/18/22 1919 128/85     Pulse Rate 02/18/22 1919 75     Resp 02/18/22 1919 16     Temp 02/18/22 1919 98.5 F (36.9 C)     Temp Source 02/18/22 1919 Oral     SpO2 02/18/22 1919 96 %     Weight 02/18/22 1924  189 lb (85.7 kg)     Height 02/18/22 1924 5\' 6"  (1.676 m)     Head Circumference --      Peak Flow --      Pain Score 02/18/22 1924 7     Pain Loc --      Pain Edu? --      Excl. in GC? --    No data found.  Updated Vital Signs BP 128/85 (BP Location: Right Arm)   Pulse 75   Temp 98.5 F (36.9 C) (Oral)   Resp 16   Ht 5\' 6"  (1.676 m)   Wt 189 lb (85.7 kg)   LMP 02/07/2022 (Approximate)   SpO2 96%   BMI 30.51 kg/m   Visual Acuity Right Eye Distance:   Left Eye Distance:   Bilateral Distance:    Right Eye Near:   Left Eye Near:    Bilateral Near:     Physical Exam Vitals and nursing note reviewed.  Constitutional:      Appearance: Normal appearance. She is not ill-appearing.  HENT:     Head: Atraumatic.  Eyes:     Extraocular Movements: Extraocular movements intact.     Conjunctiva/sclera: Conjunctivae normal.  Cardiovascular:     Rate and Rhythm: Normal rate and regular rhythm.     Heart sounds: Normal heart sounds.  Pulmonary:     Effort: Pulmonary effort is normal.     Breath sounds: Normal breath sounds.  Abdominal:     General: Bowel sounds are normal. There is no distension.     Palpations: Abdomen is soft.     Tenderness: There is no abdominal tenderness. There is no guarding.  Genitourinary:    Comments: GU exam deferred, self swab performed Musculoskeletal:        General: Normal range of motion.     Cervical back: Normal range of motion and neck supple.  Skin:    General: Skin is warm and dry.  Neurological:     Mental Status: She is alert and oriented to person, place, and time.  Psychiatric:        Mood and Affect: Mood normal.        Thought Content: Thought content normal.        Judgment: Judgment normal.     UC Treatments / Results  Labs (all labs ordered are listed, but only abnormal  results are displayed) Labs Reviewed  POCT URINALYSIS DIP (MANUAL ENTRY)  CERVICOVAGINAL ANCILLARY ONLY    EKG   Radiology No results  found.  Procedures Procedures (including critical care time)  Medications Ordered in UC Medications  ketorolac (TORADOL) 30 MG/ML injection 30 mg (30 mg Intramuscular Given 02/18/22 1956)    Initial Impression / Assessment and Plan / UC Course  I have reviewed the triage vital signs and the nursing notes.  Pertinent labs & imaging results that were available during my care of the patient were reviewed by me and considered in my medical decision making (see chart for details).     Vitals and exam overall reassuring,Urinalysis negative for urinary tract infection.  Suspect BV particularly in the setting of her history of this feeling similar.  We will treat with Flagyl gel as she does not tolerate the oral medication and boric acid suppositories.  She is requesting something for pain so IM Toradol given in clinic.  OB/GYN follow-up recommended.  Final Clinical Impressions(s) / UC Diagnoses   Final diagnoses:  Suprapubic pain  Acute vaginitis   Discharge Instructions   None    ED Prescriptions     Medication Sig Dispense Auth. Provider   metroNIDAZOLE (METROGEL VAGINAL) 0.75 % vaginal gel Place 1 Applicatorful vaginally at bedtime. 140 g Particia Nearing, [place]      PDMP not reviewed this encounter.   Particia Nearing, PA-C 02/18/22 2013    Roosvelt Maser [place], [place] 02/18/22 2014

## 2022-02-20 LAB — CERVICOVAGINAL ANCILLARY ONLY
Bacterial Vaginitis (gardnerella): NEGATIVE
Candida Glabrata: NEGATIVE
Candida Vaginitis: NEGATIVE
Chlamydia: NEGATIVE
Comment: NEGATIVE
Comment: NEGATIVE
Comment: NEGATIVE
Comment: NEGATIVE
Comment: NEGATIVE
Comment: NORMAL
Neisseria Gonorrhea: NEGATIVE
Trichomonas: NEGATIVE

## 2022-03-06 IMAGING — CT CT ANGIO NECK
2 of 11 series · 6 of 34 positions shown · IV contrast (Omnipaque or Isovue)
Comparison: Prior head CT examinations 09/29/2018 and earlier.

CLINICAL DATA: Vertebral artery dissection. Additional history
provided: Patient reports headache, dizziness, nausea.

EXAM:
CT ANGIOGRAPHY HEAD AND NECK
TECHNIQUE: Multidetector CT imaging of the head and neck was performed using
the standard protocol during bolus administration of intravenous
contrast. Multiplanar CT image reconstructions and MIPs were
obtained to evaluate the vascular anatomy. Carotid stenosis
measurements (when applicable) are obtained utilizing NASCET
criteria, using the distal internal carotid diameter as the
denominator.
CONTRAST:  75mL OMNIPAQUE IOHEXOL 350 MG/ML SOLN

[Series 7: sagittal soft · sagittal · 0.34mm/px · 1 of 55 slices shown]
[im 4/55  soft-tissue]
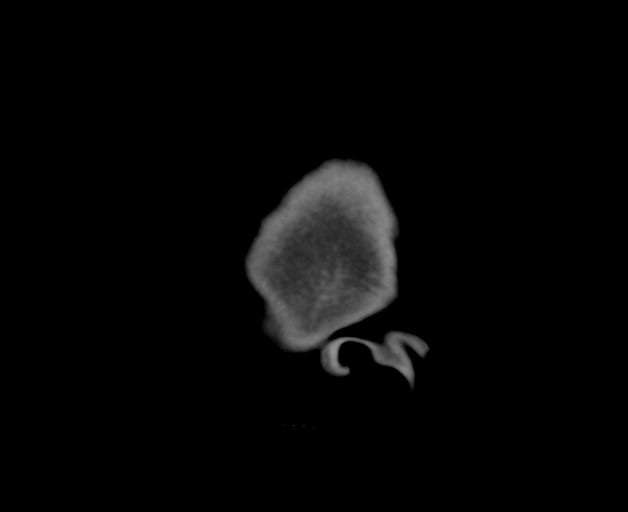

[Series 10: ax thins · axial · 0.39mm/px · z∈[+1460,+1693]mm · 5 of 354 slices shown]
[im 59/354  soft-tissue]
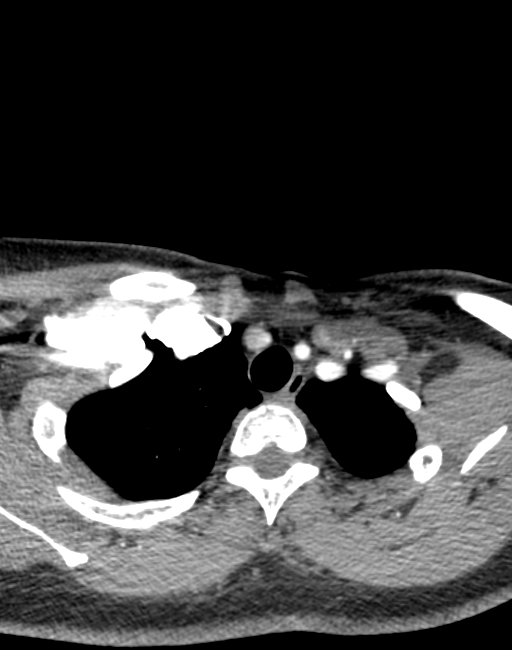
[im 118/354  bone]
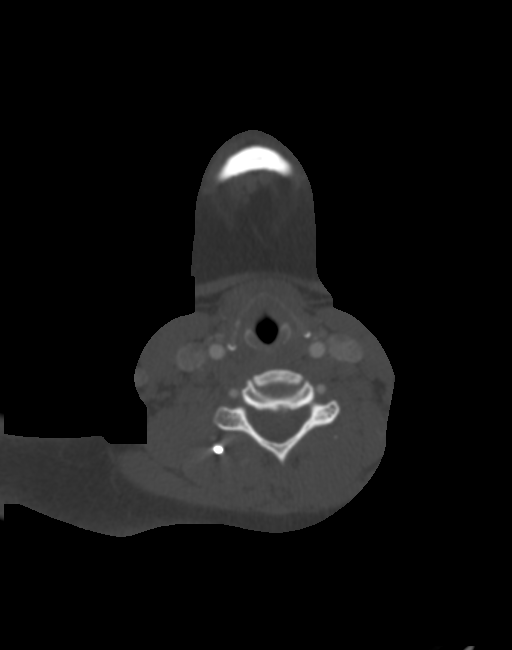
[im 177/354  soft-tissue]
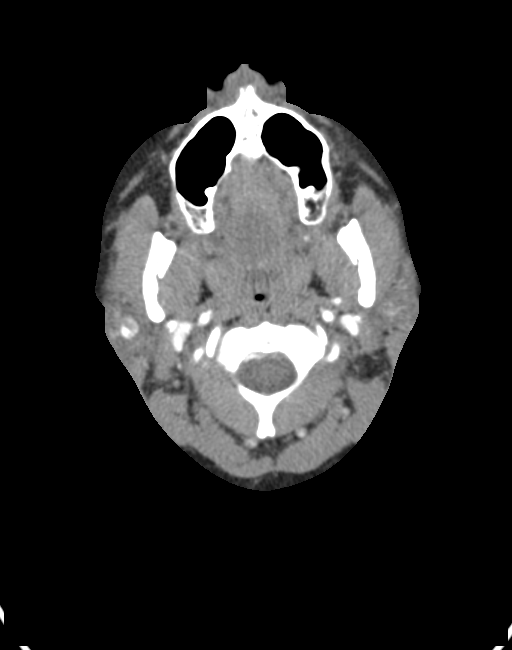
[im 236/354  bone]
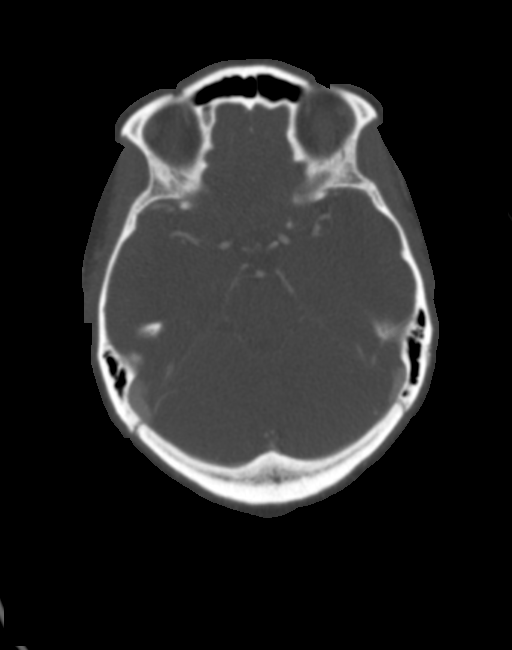
[im 295/354  soft-tissue]
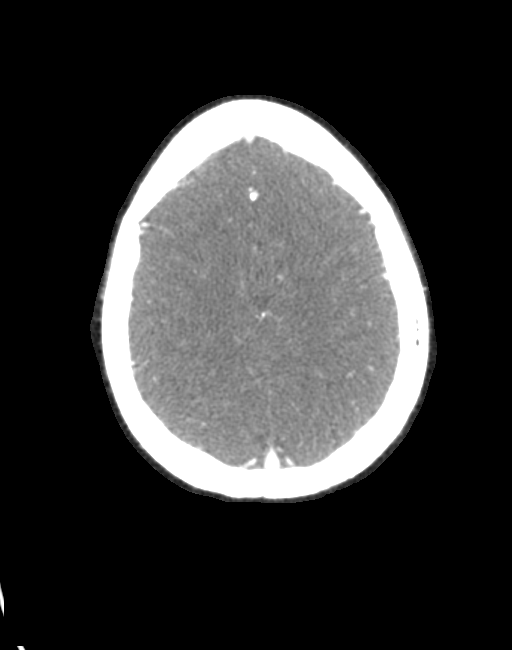

[6 of 34 positions shown; findings below may reference images not displayed]

FINDINGS: CT HEAD FINDINGS

Brain:

Cerebral volume is normal.

There is no acute intracranial hemorrhage.

No demarcated cortical infarct.

No extra-axial fluid collection.

No evidence of intracranial mass.

No midline shift.

Vascular: No hyperdense vessel.

Skull: Normal. Negative for fracture or focal lesion.

Sinuses: No significant paranasal sinus disease.

Orbits: No mass or acute finding.

Review of the MIP images confirms the above findings

CTA NECK FINDINGS

Aortic arch: Standard aortic branching. The visualized aortic arch
is unremarkable. No hemodynamically significant innominate or
proximal subclavian artery stenosis.

Right carotid system: CCA and ICA patent within the neck without
stenosis.

Left carotid system: CCA and ICA patent within the neck without
stenosis.

Vertebral arteries: Patent within the neck. Streak artifact from a
dense right-sided contrast bolus partially obscures the V1 right
vertebral artery. Within this limitation, there is no stenosis
within these vessels.

Skeleton: No acute bony abnormality or aggressive osseous lesion.
Nonspecific reversal of the expected cervical lordosis.

Other neck: No neck mass or cervical lymphadenopathy. Thyroid
unremarkable.

Upper chest: No consolidation within the imaged lung apices.

Review of the MIP images confirms the above findings

CTA HEAD FINDINGS

Anterior circulation:

The intracranial internal carotid arteries are patent. The M1 middle
cerebral arteries are patent. No M2 proximal branch occlusion or
high-grade proximal stenosis is identified. The anterior cerebral
arteries are patent. No intracranial aneurysm is identified.

Posterior circulation:

The intracranial vertebral arteries are patent. The basilar artery
is patent. The posterior cerebral arteries are patent. A right
posterior communicating artery is present. The left posterior
communicating artery is hypoplastic or absent.

Venous sinuses: Within the limitations of contrast timing, no
convincing thrombus.

Anatomic variants: As described

Review of the MIP images confirms the above findings
IMPRESSION: CT head:

Unremarkable non-contrast CT appearance of the brain. No evidence of
acute intracranial abnormality.

CTA neck:

1. Vertebral arteries patent within the neck. Streak artifact from a
dense right-sided contrast bolus partially obscures the V1 right
vertebral artery. Within this limitation, there is no vertebral
artery stenosis or dissection within the neck.
2. The bilateral common and internal carotid arteries are patent
within the neck without stenosis.

CTA head:

Unremarkable exam. No intracranial large vessel occlusion or
proximal high-grade arterial stenosis.

## 2022-03-27 ENCOUNTER — Encounter: Payer: Self-pay | Admitting: Physician Assistant

## 2022-03-27 ENCOUNTER — Ambulatory Visit (LOCAL_COMMUNITY_HEALTH_CENTER): Payer: BC Managed Care – PPO | Admitting: Physician Assistant

## 2022-03-27 VITALS — BP 134/87 | HR 87 | Temp 97.1°F | Ht 66.0 in | Wt 185.2 lb

## 2022-03-27 DIAGNOSIS — Z3009 Encounter for other general counseling and advice on contraception: Secondary | ICD-10-CM

## 2022-03-27 DIAGNOSIS — Z30013 Encounter for initial prescription of injectable contraceptive: Secondary | ICD-10-CM | POA: Diagnosis not present

## 2022-03-27 DIAGNOSIS — Z3202 Encounter for pregnancy test, result negative: Secondary | ICD-10-CM | POA: Diagnosis not present

## 2022-03-27 LAB — PREGNANCY, URINE: Preg Test, Ur: NEGATIVE

## 2022-03-27 LAB — WET PREP FOR TRICH, YEAST, CLUE
Trichomonas Exam: NEGATIVE
Yeast Exam: NEGATIVE

## 2022-03-27 LAB — HM HIV SCREENING LAB: HM HIV Screening: NEGATIVE

## 2022-03-27 MED ORDER — MEDROXYPROGESTERONE ACETATE 150 MG/ML IM SUSP
150.0000 mg | Freq: Once | INTRAMUSCULAR | Status: AC
  Administered 2022-03-27: 150 mg via INTRAMUSCULAR

## 2022-03-27 NOTE — Progress Notes (Signed)
Here for Depo Injection. Patient requests that she defer Physical, Exam, Labs until next visit. Dep at 1538 pm right arm. Addendum at 1615: patient states was able to obtain childcare therefore would like to have Physical and Labs performed today. Patient declined signing the Authorization to disclose Health Information Form.  Provider reviewed Wet Prep and Pregnancy Test results (WNL) at the time of the visit. Provided patient with [facility] Social [employer]chandiser, retail, Healthy Habits, [place] QuitLine Card and Primary Care List. Delynn Flavin RN

## 2022-03-27 NOTE — Progress Notes (Signed)
[facility] DEPARTMENT [facility] [address] Main Number: [phone]    Family Planning Visit- Initial Visit  Subjective:  Rachel Stevens is a 41 y.o.  G1P0010   being seen today for an initial annual visit and to discuss reproductive life planning.  The patient is currently using Female Condom for pregnancy prevention. Patient reports   does not want a pregnancy in the next year.     report they are looking for a method that provides Minimal bleeding/improved bleeding profile  Patient has the following medical conditions has HSV-2 (herpes simplex virus 2) infection; Irregular periods; Depression; Obesity BMI=30.9  160 lbs; Pelvic "burning" x3 years; History of reduction surgery of right breast 2009; Dizziness of unknown cause; Elevated blood pressure reading; Fatigue; Female infertility; Vitamin B12 deficiency; Vitamin D insufficiency; Vulvar pain; Breast mass in female; and Anxiety on their problem list.  No chief complaint on file.   Patient reports she feels well except some right breast discomfort.  Patient denies other concerns.   Body mass index is 29.89 kg/m. - Patient is eligible for diabetes screening based on BMI and age >45?  no HA1C ordered? no  Patient reports 1  partner/s in last year. Desires STI screening?  Yes  Has patient been screened once for HCV in the past?  Yes 08/2020 = neg  No results found for: "HCVAB"  Does the patient have current drug use (including MJ), have a partner with drug use, and/or has been incarcerated since last result? No  If yes-- Screen for HCV through [facility]   Does the patient meet criteria for HBV testing? No  Criteria:  -Household, sexual or needle sharing contact with HBV -History of drug use -HIV positive -Those with known Hep C   Health Maintenance Due  Topic Date Due   COVID-19 Vaccine (1) Never done    Review of Systems  All other systems reviewed and are negative. ROS  pos for right breast lump/pain at site of prior lumpectomy, discomfort worse prior to menses. Due for menses in a few days.  The following portions of the patient's history were reviewed and updated as appropriate: allergies, current medications, past family history, past medical history, past social history, past surgical history and problem list. Problem list updated.   See flowsheet for other program required questions.  Objective:   Vitals:   03/27/22 1453  BP: 134/87  Pulse: 87  Temp: (!) 97.1 F (36.2 C)  Weight: 185 lb 3.2 oz (84 kg)  Height: 5\' 6"  (1.676 m)    Physical Exam Constitutional:      Appearance: Normal appearance.  HENT:     Head: Normocephalic and atraumatic.  Cardiovascular:     Rate and Rhythm: Normal rate and regular rhythm.     Pulses: Normal pulses.     Heart sounds: Normal heart sounds.  Pulmonary:     Effort: Pulmonary effort is normal.  Chest:  Breasts:    Tanner Score is 5.     Right: Mass and skin change present.     Comments: Sl tender nodular 1x2cm firm mass under scar at edge of r upper outer quadrant of R aereola. Abdominal:     Palpations: Abdomen is soft.  Genitourinary:    General: Normal vulva.     Exam position: Lithotomy position.     Labia:        Right: No lesion.        Left: No lesion.  Vagina: No vaginal discharge or bleeding.     Cervix: No cervical motion tenderness.     Uterus: Not tender.   Musculoskeletal:        General: Normal range of motion.  Lymphadenopathy:     Lower Body: No left inguinal adenopathy.  Skin:    General: Skin is warm and dry.  Neurological:     General: No focal deficit present.     Mental Status: She is alert.  Psychiatric:        Mood and Affect: Mood normal.        Behavior: Behavior normal.       Assessment and Plan:  Rachel Stevens is a 41 y.o. female presenting to the [facility] Department for an initial annual wellness/contraceptive visit  Contraception  counseling: Reviewed options based on patient desire and reproductive life plan. Patient is interested in Hormonal Injection. This was provided to the patient today.   Risks, benefits, and typical effectiveness rates were reviewed.  Questions were answered.  Written information was also given to the patient to review.    The patient will follow up in  3 months for surveillance.  The patient was told to call with any further questions, or with any concerns about this method of contraception.  Emphasized use of condoms 100% of the time for STI prevention.  Need for ECP was assessed. Patient reported Unprotected sex within past 72 hours.  Partner has had vasectomy. Reviewed options and patient desired No method of ECP, declined all    1. Family planning services UPT neg. DMPA 150mg  IM today, then repeat every 3 mo for 1 year. Recommend repeat mammogram (last 03/2021). Rec est primary care provider. - medroxyPROGESTERone (DEPO-PROVERA) injection 150 mg - Chlamydia/Gonorrhea [place] [facility] - HIV [facility] - Syphilis Serology, [facility] - WET PREP FOR TRICH, YEAST, CLUE - Pregnancy, urine  2. Encounter for initial prescription of injectable contraceptive     Return in about 3 months (around 06/27/2022) for Routine DMPA injection.  No future appointments.  06/29/2022, PA-C

## 2022-04-10 ENCOUNTER — Ambulatory Visit
Admission: EM | Admit: 2022-04-10 | Discharge: 2022-04-10 | Disposition: A | Discharge status: Home / Self Care | Payer: BC Managed Care – PPO | Attending: Nurse Practitioner | Admitting: Nurse Practitioner

## 2022-04-10 ENCOUNTER — Encounter: Payer: Self-pay | Admitting: Emergency Medicine

## 2022-04-10 DIAGNOSIS — Z566 Other physical and mental strain related to work: Secondary | ICD-10-CM | POA: Diagnosis not present

## 2022-04-10 DIAGNOSIS — H8109 Meniere's disease, unspecified ear: Secondary | ICD-10-CM

## 2022-04-10 DIAGNOSIS — R42 Dizziness and giddiness: Secondary | ICD-10-CM | POA: Diagnosis not present

## 2022-04-10 MED ORDER — HYDROXYZINE HCL 10 MG PO TABS: 10.0000 mg | ORAL_TABLET | Freq: Three times a day (TID) | ORAL | 0 refills | Status: DC | PRN

## 2022-04-10 NOTE — ED Triage Notes (Signed)
Dizziness since Wednesday.  States she has a burning sensation in legs and stomach.  Also headache

## 2022-04-10 NOTE — ED Provider Notes (Signed)
[facility]    CSN: [id number] Arrival date & time: 04/10/22  1212      History   Chief Complaint Chief Complaint  Patient presents with   Dizziness    HPI Rachel Stevens is a 41 y.o. female.   Patient presents today for recent increase in stress/Mnire/vertigo flare that started Wednesday after she reports being bullied at work.  Reports the dizziness comes and goes, does not have any currently.  She takes meclizine and this helps with the dizziness, however she does not take this at work because it makes her sleepy.  Reports she has a prescription for this at home, does not need a refill.  Also reports recent increase in stress since the bullying-reports that she has burning in her abdomen and upper legs.  Reports when she is stressed, this is usually where she feels it.  She denies any dysuria, urinary frequency, abdominal pain.  She does not take anything for stress/anxiety on a regular basis.  Reports that she has a primary care provider and neurologist visit scheduled both for next week.  Reports that she is on disability as result of the vertigo/Mnire's-does not need a work note today.    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   Hypertension    Irregular periods 01/31/2015   Meniere disease    Missed period 11/01/2014   Patient desires pregnancy 11/01/2014   Pelvic floor tension    Vaginal discharge 11/01/2014   Vulvar pain    Weight gain 11/01/2014    Patient Active Problem List   Diagnosis Date Noted   Anxiety 08/19/2020   Breast mass in female 03/04/2020   Elevated blood pressure reading 02/21/2020   Obesity BMI=30.9  160 lbs 07/05/2019   Pelvic "burning" x3 years 07/05/2019   History of reduction surgery of right breast 2009 07/05/2019   Depression 07/05/2015   Dizziness of unknown cause 07/05/2015   Fatigue 07/05/2015    Vitamin B12 deficiency 07/05/2015   Vitamin D insufficiency 07/05/2015   Vulvar pain 07/05/2015   Irregular periods 01/31/2015   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 Authorizing Provider  hydrOXYzine (ATARAX) 10 MG tablet Take 1 tablet (10 mg total) by mouth 3 (three) times daily as needed for anxiety. Do not take while driving or operating heavy machinery 04/10/22  Yes Cathlean Marseilles A, NP  desipramine (NORPRAMIN) 25 MG tablet Take by mouth. 07/04/20 07/04/21  [provider]  hydrochlorothiazide (MICROZIDE) 12.5 MG capsule Take 12.5 mg by mouth daily.    [provider]  meclizine (ANTIVERT) 25 MG tablet Take 1 tablet (25 mg total) by mouth  3 (three) times daily as needed for dizziness. 11/08/21   Particia Nearing, PA-C  metroNIDAZOLE (METROGEL VAGINAL) 0.75 % vaginal gel Place 1 Applicatorful vaginally at bedtime. Patient not taking: Reported on 03/27/2022 02/18/22   Particia Nearing, PA-C  ondansetron (ZOFRAN-ODT) 4 MG disintegrating tablet Take 1 tablet (4 mg total) by mouth every 8 (eight) hours as needed for nausea or vomiting. Patient not taking: Reported on 03/27/2022 11/08/21   Particia Nearing, PA-C  predniSONE (DELTASONE) 20 MG tablet Take 2 tablets (40 mg total) by mouth daily. Patient not taking: Reported on 03/27/2022 12/29/21   Theron Arista, PA-C    Family History Family History  Problem Relation Age of Onset   Hypertension Mother    Heart disease Mother    Cancer Father        throat   Pancreatic  cancer Father    Congestive Heart Failure Brother    Heart disease Maternal Grandmother    Hypertension Maternal Grandmother    Cancer Maternal Grandfather    Hypertension Maternal Grandfather    Cancer Paternal Grandmother        breast   Breast cancer Maternal Aunt     Social History Social History   Tobacco Use   Smoking status: Never   Smokeless tobacco: Never  Vaping Use   Vaping Use: Never used  Substance Use Topics   Alcohol use: Not Currently   Drug use: No     Allergies   Bee venom, Latex, and Penicillins   Review of Systems Review of Systems Per HPI  Physical Exam Triage Vital Signs ED Triage Vitals  Enc Vitals Group     BP 04/10/22 1223 131/89     Pulse Rate 04/10/22 1223 83     Resp 04/10/22 1223 18     Temp 04/10/22 1223 98.7 F (37.1 C)     Temp Source 04/10/22 1223 Oral     SpO2 04/10/22 1223 98 %     Weight --      Height --      Head Circumference --      Peak Flow --      Pain Score 04/10/22 1224 7     Pain Loc --      Pain Edu? --      Excl. in GC? --    No data found.  Updated Vital Signs BP 131/89 (BP Location: Right Arm)   Pulse 83   Temp 98.7 F (37.1 C) (Oral)   Resp 18   SpO2 98%   Visual Acuity Right Eye Distance:   Left Eye Distance:   Bilateral Distance:    Right Eye Near:   Left Eye Near:    Bilateral Near:     Physical Exam Vitals and nursing note reviewed.  Constitutional:      General: She is not in acute distress.    Appearance: Normal appearance. She is not toxic-appearing.  HENT:     Head: Normocephalic.     Mouth/Throat:     Mouth: Mucous membranes are moist.     Pharynx: Oropharynx is clear.  Eyes:     General: No scleral icterus.       Right eye: No discharge.        Left eye: No discharge.     Extraocular Movements: Extraocular movements intact.     Pupils: Pupils are equal, round, and reactive to light.  Cardiovascular:     Rate and Rhythm: Normal rate and regular rhythm.  Pulmonary:      Effort: Pulmonary effort is normal. No respiratory distress.     Breath sounds: No wheezing, rhonchi or rales.  Skin:    General: Skin is warm and dry.     Capillary Refill: Capillary refill takes less than 2 seconds.     Coloration: Skin is not jaundiced or pale.     Findings: No erythema.  Neurological:     Mental Status: She is alert and oriented to person, place, and time.  Psychiatric:        Behavior: Behavior is cooperative.      UC Treatments / Results  Labs (all labs ordered are listed, but only abnormal results are displayed) Labs Reviewed - No data to display  EKG   Radiology No results found.  Procedures Procedures (including critical care time)  Medications Ordered in UC Medications - No data to display  Initial Impression / Assessment and Plan / UC Course  I have reviewed the triage vital signs and the nursing notes.  Pertinent labs & imaging results that were available during my care of the patient were reviewed by me and considered in my medical decision making (see chart for details).    Patient is a well-appearing 41 year old female presenting for vertigo flare, stress at work.  Noted flags in history or on exam today.  Treat stress with hydroxyzine 10 mg every 8 hours as needed.  Encouraged close follow-up with primary care provider and neurologist as scheduled.  Seek care if symptoms worsen or are not improved with the prescribed treatment. Final Clinical Impressions(s) / UC Diagnoses   Final diagnoses:  Vertigo  Meniere's disease, unspecified laterality  Stress at work     Discharge Instructions      - You can try the hydroxyzine 10 mg every 8 hours as needed for stress/anxiety -Please follow-up as scheduled with your primary care provider and neurologist     ED Prescriptions     Medication Sig Dispense Auth. Provider   hydrOXYzine (ATARAX) 10 MG tablet Take 1 tablet (10 mg total) by mouth 3 (three) times daily as needed for anxiety. Do  not take while driving or operating heavy machinery 30 tablet Eulogio Bear, NP      PDMP not reviewed this encounter.   Eulogio Bear, NP 04/10/22 1328

## 2022-04-10 NOTE — Discharge Instructions (Addendum)
-   You can try the hydroxyzine 10 mg every 8 hours as needed for stress/anxiety -Please follow-up as scheduled with your primary care provider and neurologist

## 2022-04-24 ENCOUNTER — Ambulatory Visit
Admission: RE | Admit: 2022-04-24 | Discharge: 2022-04-24 | Disposition: A | Discharge status: Home / Self Care | Payer: BC Managed Care – PPO | Source: Ambulatory Visit | Attending: Family Medicine | Admitting: Family Medicine

## 2022-04-24 VITALS — BP 132/86 | HR 85 | Temp 99.2°F | Resp 16

## 2022-04-24 DIAGNOSIS — J029 Acute pharyngitis, unspecified: Secondary | ICD-10-CM | POA: Insufficient documentation

## 2022-04-24 LAB — POCT RAPID STREP A (OFFICE): Rapid Strep A Screen: NEGATIVE

## 2022-04-24 NOTE — ED Triage Notes (Signed)
Pt reports sore throat and headache x 2 days. Ibuprofen gives some relief.

## 2022-04-24 NOTE — ED Provider Notes (Signed)
[facility]    CSN: [id number] Arrival date & time: 04/24/22  1515      History   Chief Complaint Chief Complaint  Patient presents with   Sore Throat    With Headache - Entered by patient   Headache   HPI Rachel Stevens is a 41 y.o. female.   Presenting today with 2-day history of sore throat, headache, fatigue.  Denies cough, congestion, abdominal pain, nausea vomiting diarrhea, chest pain, shortness of breath.  Tried some ibuprofen with some mild relief.  No known sick contacts recently.  She is requesting STI testing of the throat if strep is negative.    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   Hypertension    Irregular periods 01/31/2015   Meniere disease    Missed period 11/01/2014   Patient desires pregnancy 11/01/2014   Pelvic floor tension    Vaginal discharge 11/01/2014   Vulvar pain    Weight gain 11/01/2014    Patient Active Problem List   Diagnosis Date Noted   Anxiety 08/19/2020   Breast mass in female 03/04/2020   Elevated blood pressure reading 02/21/2020   Obesity BMI=30.9  160 lbs 07/05/2019   Pelvic "burning" x3 years 07/05/2019   History of reduction surgery of right breast 2009 07/05/2019   Depression 07/05/2015   Dizziness of unknown cause 07/05/2015   Fatigue 07/05/2015   Vitamin B12 deficiency 07/05/2015   Vitamin D insufficiency 07/05/2015   Vulvar pain 07/05/2015   Irregular periods 01/31/2015   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 Authorizing Provider  medroxyPROGESTERone (DEPO-PROVERA) 150 MG/ML injection Inject 150 mg into the muscle every 3 (three) months.   Yes [provider]  desipramine (NORPRAMIN) 25 MG tablet Take by mouth. 07/04/20 07/04/21  [provider]  hydrochlorothiazide (MICROZIDE) 12.5 MG capsule Take 12.5 mg by mouth daily.    [provider]  hydrOXYzine (ATARAX) 10 MG tablet Take 1 tablet (10 mg total) by mouth 3 (three) times daily as needed for anxiety. Do not take while driving or operating heavy machinery 04/10/22   Valentino Nose, NP  meclizine (ANTIVERT) 25 MG tablet Take 1 tablet (25 mg total) by mouth 3 (three) times daily as needed for dizziness. 11/08/21   Particia Nearing, PA-C  metroNIDAZOLE (METROGEL VAGINAL) 0.75 % vaginal gel Place 1 Applicatorful vaginally at bedtime. Patient not taking: Reported on 03/27/2022 02/18/22   Particia Nearing, PA-C  ondansetron (ZOFRAN-ODT) 4 MG disintegrating tablet Take 1 tablet (4 mg total) by mouth every 8 (eight) hours as needed for nausea or vomiting. Patient not taking: Reported on 03/27/2022  11/08/21   Particia Nearing, PA-C  predniSONE (DELTASONE) 20 MG tablet Take 2 tablets (40 mg total) by mouth daily. Patient not taking: Reported on 03/27/2022 12/29/21   Theron Arista, PA-C    Family History Family History  Problem Relation Age of Onset   Hypertension Mother    Heart disease Mother    Cancer Father        throat   Pancreatic cancer Father    Congestive Heart Failure Brother    Heart disease Maternal Grandmother    Hypertension Maternal Grandmother    Cancer Maternal Grandfather    Hypertension Maternal Grandfather    Cancer Paternal Grandmother        breast   Breast cancer Maternal Aunt     Social History Social History   Tobacco Use   Smoking status: Never   Smokeless tobacco: Never  Vaping Use   Vaping Use:  Never used  Substance Use Topics   Alcohol use: Not Currently   Drug use: No     Allergies   Bee venom, Latex, and Penicillins   Review of Systems Review of Systems Per HPI  Physical Exam Triage Vital Signs ED Triage Vitals  Enc Vitals Group     BP 04/24/22 1528 132/86     Pulse Rate 04/24/22 1528 85     Resp 04/24/22 1528 16     Temp 04/24/22 1528 99.2 F (37.3 C)     Temp Source 04/24/22 1528 Oral     SpO2 04/24/22 1528 97 %     Weight --      Height --      Head Circumference --      Peak Flow --      Pain Score 04/24/22 1526 7     Pain Loc --      Pain Edu? --      Excl. in GC? --    No data found.  Updated Vital Signs BP 132/86 (BP Location: Right Arm)   Pulse 85   Temp 99.2 F (37.3 C) (Oral)   Resp 16   SpO2 97%   Visual Acuity Right Eye Distance:   Left Eye Distance:   Bilateral Distance:    Right Eye Near:   Left Eye Near:    Bilateral Near:     Physical Exam Vitals and nursing note reviewed.  Constitutional:      Appearance: Normal appearance.  HENT:     Head: Atraumatic.     Right Ear: Tympanic membrane and external ear normal.     Left Ear: Tympanic membrane and external ear normal.     Mouth/Throat:     Mouth: Mucous membranes are moist.     Pharynx: Posterior oropharyngeal erythema present.  Eyes:     Extraocular Movements: Extraocular movements intact.     Conjunctiva/sclera: Conjunctivae normal.  Cardiovascular:     Rate and Rhythm: Normal rate and regular rhythm.     Heart sounds: Normal heart sounds.  Pulmonary:     Effort: Pulmonary effort is normal.     Breath sounds: Normal breath sounds. No wheezing or rales.  Musculoskeletal:        General: Normal range of motion.     Cervical back: Normal range of motion and neck supple.  Lymphadenopathy:     Cervical: No cervical adenopathy.  Skin:    General: Skin is warm and dry.  Neurological:     Mental Status: She is alert and oriented to person, place, and  time.      Motor: No weakness.     Gait: Gait normal.  Psychiatric:        Mood and Affect: Mood normal.        Thought Content: Thought content normal.    UC Treatments / Results  Labs (all labs ordered are listed, but only abnormal results are displayed) Labs Reviewed  CULTURE, GROUP A STREP ([facility])  COVID-19, FLU A+B NAA  POCT RAPID STREP A (OFFICE)  CYTOLOGY, (ORAL, ANAL, URETHRAL) ANCILLARY ONLY   EKG   Radiology No results found.  Procedures Procedures (including critical care time)  Medications Ordered in UC Medications - No data to display  Initial Impression / Assessment and Plan / UC Course  I have reviewed the triage vital signs and the nursing notes.  Pertinent labs & imaging results that were available during my care of the patient were reviewed by me and considered in my medical decision making (see chart for details).     Vital signs and exam overall reassuring, suspect viral pharyngitis.  Rapid strep negative, throat culture, COVID and flu and oral cytology swab pending.  Discussed supportive over-the-counter medications, home care.  Return for worsening symptoms.  Final Clinical Impressions(s) / UC Diagnoses   Final diagnoses:  Acute pharyngitis, unspecified etiology   Discharge Instructions   None    ED Prescriptions   None    PDMP not reviewed this encounter.   Particia Nearing, [place] 04/24/22 [phone]

## 2022-04-26 LAB — COVID-19, FLU A+B NAA
Influenza A, NAA: NOT DETECTED
Influenza B, NAA: NOT DETECTED
SARS-CoV-2, NAA: NOT DETECTED

## 2022-04-27 LAB — CULTURE, GROUP A STREP (THRC)

## 2022-04-27 LAB — CYTOLOGY, (ORAL, ANAL, URETHRAL) ANCILLARY ONLY
Chlamydia: NEGATIVE
Comment: NEGATIVE
Comment: NEGATIVE
Comment: NORMAL
Neisseria Gonorrhea: NEGATIVE
Trichomonas: NEGATIVE

## 2022-05-25 ENCOUNTER — Ambulatory Visit
Admission: EM | Admit: 2022-05-25 | Discharge: 2022-05-25 | Disposition: A | Discharge status: Home / Self Care | Payer: BC Managed Care – PPO

## 2022-05-25 ENCOUNTER — Ambulatory Visit: Payer: Self-pay

## 2022-05-25 DIAGNOSIS — Z8619 Personal history of other infectious and parasitic diseases: Secondary | ICD-10-CM

## 2022-05-25 DIAGNOSIS — J029 Acute pharyngitis, unspecified: Secondary | ICD-10-CM | POA: Diagnosis not present

## 2022-05-25 DIAGNOSIS — R5383 Other fatigue: Secondary | ICD-10-CM

## 2022-05-25 MED ORDER — ACYCLOVIR 400 MG PO TABS: 400.0000 mg | ORAL_TABLET | Freq: Three times a day (TID) | ORAL | 0 refills | Status: AC

## 2022-05-25 MED ORDER — KETOROLAC TROMETHAMINE 60 MG/2ML IM SOLN: 30.0000 mg | Freq: Once | INTRAMUSCULAR | Status: DC

## 2022-05-25 MED ORDER — KETOROLAC TROMETHAMINE 30 MG/ML IJ SOLN: 30.0000 mg | Freq: Once | INTRAMUSCULAR | Status: DC

## 2022-05-25 MED ORDER — KETOROLAC TROMETHAMINE 60 MG/2ML IM SOLN
60.0000 mg | Freq: Once | INTRAMUSCULAR | Status: AC
  Administered 2022-05-25: 60 mg via INTRAMUSCULAR

## 2022-05-25 NOTE — ED Provider Notes (Signed)
[facility]    CSN: [id number] Arrival date & time: 05/25/22  0901      History   Chief Complaint Chief Complaint  Patient presents with   Appointment    0900   Exposure to STD    HPI Rachel Stevens is a 41 y.o. female.   Presents with more than 1 week of sore throat, fatigue, itchy skin, nausea/vomiting, decreased appetite, and overall feeling unwell.  Endorses body aches and chills, however denies known fevers.  No chest pain or shortness of breath.  No abdominal pain.  No vomiting blood or blood in her stool.  She has been able to drink fluid and keep it down in the past 24 hours.  She reports these are the symptoms she typically gets with her HSV outbreaks.  She denies any rashes, sores, or lesions on her genitalia.  Reports she was diagnosed with HSV when she was 6 and has had outbreaks like this occasionally when she is under increased stress.  Reports she called last night and somebody told her to come to urgent care to get an antiviral infusion or to be set up for outpatient antiviral infusions.    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   Hypertension    Irregular periods 01/31/2015   Meniere disease    Missed period 11/01/2014   Patient desires pregnancy 11/01/2014   Pelvic floor tension    Vaginal discharge 11/01/2014   Vulvar pain    Weight gain 11/01/2014    Patient Active Problem List   Diagnosis Date Noted   Anxiety 08/19/2020   Breast mass in female 03/04/2020   Elevated blood pressure reading 02/21/2020   Obesity BMI=30.9  160 lbs 07/05/2019   Pelvic "burning" x3 years 07/05/2019   History of reduction surgery of right breast 2009 07/05/2019   Depression 07/05/2015   Dizziness of unknown cause 07/05/2015   Fatigue 07/05/2015   Vitamin B12 deficiency 07/05/2015   Vitamin D insufficiency 07/05/2015   Vulvar pain 07/05/2015    Irregular periods 01/31/2015   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 Authorizing Provider  acyclovir (ZOVIRAX) 400 MG tablet Take 1 tablet (400 mg total) by mouth every 8 (eight) hours for 10 days. 05/25/22 06/04/22 Yes Cathlean Marseilles A, NP  desvenlafaxine (PRISTIQ) 25 MG 24 hr tablet Take by mouth. 04/14/22 07/27/22 Yes [provider]  medroxyPROGESTERone (DEPO-PROVERA) 150 MG/ML injection Inject 150 mg into the muscle every 3 (three) months.   Yes [provider]  pantoprazole (PROTONIX) 20 MG tablet  01/08/22  Yes [provider]  propranolol (INDERAL) 10 MG tablet Take by mouth.  04/14/22 04/14/23 Yes [provider]  desipramine (NORPRAMIN) 25 MG tablet Take by mouth. 07/04/20 07/04/21  [provider]  hydrochlorothiazide (MICROZIDE) 12.5 MG capsule Take 12.5 mg by mouth daily.    [provider]  hydrOXYzine (ATARAX) 10 MG tablet Take 1 tablet (10 mg total) by mouth 3 (three) times daily as needed for anxiety. Do not take while driving or operating heavy machinery 04/10/22   Valentino Nose, NP  meclizine (ANTIVERT) 25 MG tablet Take 1 tablet (25 mg total) by mouth 3 (three) times daily as needed for dizziness. 11/08/21   Particia Nearing, PA-C  medroxyPROGESTERone (DEPO-PROVERA) 150 MG/ML injection Inject 150 mg into the muscle every 3 (three) months.    [provider]  ondansetron (ZOFRAN-ODT) 4 MG disintegrating tablet Take 1 tablet (4 mg total) by mouth every 8 (eight) hours  as needed for nausea or vomiting. Patient not taking: Reported on 03/27/2022 11/08/21   Particia Nearing, PA-C  rizatriptan (MAXALT) 10 MG tablet Take by mouth. 05/13/22   [provider]  topiramate (TOPAMAX) 25 MG tablet Take 25 mg by mouth at bedtime. 05/13/22   [provider]    Family History Family History  Problem Relation Age of Onset   Hypertension Mother    Heart disease Mother    Cancer Father        throat   Pancreatic cancer Father    Congestive Heart Failure Brother    Heart disease Maternal Grandmother    Hypertension Maternal Grandmother    Cancer Maternal Grandfather    Hypertension Maternal Grandfather    Cancer Paternal Grandmother        breast   Breast cancer Maternal Aunt     Social History Social History   Tobacco Use   Smoking status: Never   Smokeless tobacco: Never  Vaping Use   Vaping Use: Never used  Substance Use Topics   Alcohol use: Not Currently   Drug use: No     Allergies   Bee venom, Latex, and Penicillins   Review of Systems Review of Systems Per HPI  Physical Exam Triage Vital Signs ED Triage Vitals  Enc Vitals Group     BP 05/25/22 0922 130/81     Pulse Rate 05/25/22 0922 72     Resp 05/25/22 0922 16     Temp 05/25/22 0922 98.4 F (36.9 C)     Temp Source 05/25/22 0922 Oral     SpO2 05/25/22 0922 99 %     Weight --      Height --      Head Circumference --      Peak Flow --      Pain Score 05/25/22 0920 0     Pain Loc --      Pain Edu? --      Excl. in GC? --    No data found.  Updated Vital Signs BP 130/81 (BP Location: Right Arm)   Pulse 72   Temp 98.4 F (36.9 C) (Oral)   Resp 16   SpO2 99%   Visual Acuity Right Eye Distance:   Left Eye Distance:   Bilateral Distance:    Right Eye Near:   Left Eye Near:    Bilateral Near:     Physical Exam Vitals and nursing note reviewed.  Constitutional:      General: She is not in acute distress.    Appearance: She is not  toxic-appearing.  HENT:  Head: Normocephalic and atraumatic.     Mouth/Throat:     Mouth: Mucous membranes are moist.     Pharynx: Oropharynx is clear. No posterior oropharyngeal erythema.  Cardiovascular:     Rate and Rhythm: Normal rate and regular rhythm.  Pulmonary:     Effort: Pulmonary effort is normal. No respiratory distress.     Breath sounds: Normal breath sounds. No wheezing, rhonchi or rales.  Genitourinary:    Comments: Deferred Musculoskeletal:     Cervical back: Normal range of motion.  Lymphadenopathy:     Cervical: No cervical adenopathy.  Skin:    General: Skin is warm and dry.     Capillary Refill: Capillary refill takes less than 2 seconds.     Coloration: Skin is not jaundiced or pale.     Findings: No erythema.  Neurological:     Mental Status: She is alert and oriented to person, place, and time.     Motor: No weakness.     Gait: Gait normal.  Psychiatric:        Behavior: Behavior is cooperative.      UC Treatments / Results  Labs (all labs ordered are listed, but only abnormal results are displayed) Labs Reviewed - No data to display  EKG   Radiology No results found.  Procedures Procedures (including critical care time)  Medications Ordered in UC Medications  ketorolac (TORADOL) injection 60 mg (60 mg Intramuscular Given 05/25/22 0946)    Initial Impression / Assessment and Plan / UC Course  I have reviewed the triage vital signs and the nursing notes.  Pertinent labs & imaging results that were available during my care of the patient were reviewed by me and considered in my medical decision making (see chart for details).    Patient is a well-appearing 41 year old female presenting for constellation of symptoms today including fatigue, acute pharyngitis, and history of herpes 2 virus.  She attributes all of her symptoms today to HSV-2 infection and reports her outbreaks occur "internally."  She is requesting something for pain  today, Toradol 30 mg IM given in urgent care today.  We will switch patient from suppressive Valtrex to acyclovir at the treatment dose.  Recommended following up with primary care provider to discuss outpatient infusions of acyclovir if they feel this is indicated.  Viral testing deferred today as symptoms have been ongoing within 1 week. Final Clinical Impressions(s) / UC Diagnoses   Final diagnoses:  Other fatigue  Acute pharyngitis, unspecified etiology  History of positive PCR for herpes simplex virus type 2 (HSV-2) DNA     Discharge Instructions      - Please stop the Valtrex and start the acyclovir if you feel like the Valtrex is not helping your symptoms  - Follow up with your primary care provider to discuss the outpatient infusions - We have given you a shot of Toradol 30 mg to help with pain/inflammation    ED Prescriptions     Medication Sig Dispense Auth. Provider   acyclovir (ZOVIRAX) 400 MG tablet Take 1 tablet (400 mg total) by mouth every 8 (eight) hours for 10 days. 30 tablet Valentino Nose, NP      PDMP not reviewed this encounter.   Valentino Nose, NP 05/25/22 1623

## 2022-05-25 NOTE — ED Triage Notes (Signed)
Pt reports body aches, fatigue, throat burning. Pt reports she needs to sleep with an ice pack between the legs to feel comfortable. Pt reports she was told to the ED to be treated for HSV.

## 2022-05-25 NOTE — Discharge Instructions (Signed)
-   Please stop the Valtrex and start the acyclovir if you feel like the Valtrex is not helping your symptoms  - Follow up with your primary care provider to discuss the outpatient infusions - We have given you a shot of Toradol 30 mg to help with pain/inflammation

## 2022-05-27 ENCOUNTER — Emergency Department (HOSPITAL_COMMUNITY)
Admission: EM | Admit: 2022-05-27 | Discharge: 2022-05-27 | Disposition: A | Discharge status: Home / Self Care | Payer: BC Managed Care – PPO | Attending: Emergency Medicine | Admitting: Emergency Medicine

## 2022-05-27 ENCOUNTER — Encounter (HOSPITAL_COMMUNITY): Payer: Self-pay | Admitting: *Deleted

## 2022-05-27 ENCOUNTER — Other Ambulatory Visit: Payer: Self-pay

## 2022-05-27 ENCOUNTER — Emergency Department (HOSPITAL_COMMUNITY): Payer: BC Managed Care – PPO

## 2022-05-27 DIAGNOSIS — R6883 Chills (without fever): Secondary | ICD-10-CM | POA: Diagnosis not present

## 2022-05-27 DIAGNOSIS — N898 Other specified noninflammatory disorders of vagina: Secondary | ICD-10-CM | POA: Diagnosis not present

## 2022-05-27 DIAGNOSIS — R519 Headache, unspecified: Secondary | ICD-10-CM | POA: Insufficient documentation

## 2022-05-27 DIAGNOSIS — Z91040 Latex allergy status: Secondary | ICD-10-CM | POA: Diagnosis not present

## 2022-05-27 LAB — BASIC METABOLIC PANEL
Anion gap: 5 (ref 5–15)
BUN: 10 mg/dL (ref 6–20)
CO2: 25 mmol/L (ref 22–32)
Calcium: 8.9 mg/dL (ref 8.9–10.3)
Chloride: 109 mmol/L (ref 98–111)
Creatinine, Ser: 0.84 mg/dL (ref 0.44–1.00)
GFR, Estimated: >60 mL/min (ref >60)
Glucose, Bld: 99 mg/dL (ref 70–99)
Potassium: 3.6 mmol/L (ref 3.5–5.1)
Sodium: 139 mmol/L (ref 135–145)

## 2022-05-27 LAB — CBC WITH DIFFERENTIAL/PLATELET
Abs Immature Granulocytes: 0.03 10*3/uL (ref 0.00–0.07)
Basophils Absolute: 0 10*3/uL (ref 0.0–0.1)
Basophils Relative: 0 %
Eosinophils Absolute: 0.1 10*3/uL (ref 0.0–0.5)
Eosinophils Relative: 1 %
HCT: 37.7 % (ref 36.0–46.0)
Hemoglobin: 12.2 g/dL (ref 12.0–15.0)
Immature Granulocytes: 0 %
Lymphocytes Relative: 24 %
Lymphs Abs: 2.3 10*3/uL (ref 0.7–4.0)
MCH: 28.2 pg (ref 26.0–34.0)
MCHC: 32.4 g/dL (ref 30.0–36.0)
MCV: 87.3 fL (ref 80.0–100.0)
Monocytes Absolute: 0.7 10*3/uL (ref 0.1–1.0)
Monocytes Relative: 7 %
Neutro Abs: 6.4 10*3/uL (ref 1.7–7.7)
Neutrophils Relative %: 68 %
Platelets: 299 10*3/uL (ref 150–400)
RBC: 4.32 MIL/uL (ref 3.87–5.11)
RDW: 13.2 % (ref 11.5–15.5)
WBC: 9.5 10*3/uL (ref 4.0–10.5)
nRBC: 0 % (ref 0.0–0.2)

## 2022-05-27 MED ORDER — PROCHLORPERAZINE EDISYLATE 10 MG/2ML IJ SOLN
10.0000 mg | Freq: Once | INTRAMUSCULAR | Status: AC
  Administered 2022-05-27: 10 mg via INTRAMUSCULAR
  Filled 2022-05-27: qty 2

## 2022-05-27 MED ORDER — DIPHENHYDRAMINE HCL 25 MG PO CAPS
25.0000 mg | ORAL_CAPSULE | Freq: Once | ORAL | Status: AC
  Administered 2022-05-27: 25 mg via ORAL
  Filled 2022-05-27: qty 1

## 2022-05-27 NOTE — Discharge Instructions (Signed)
Continue using ibuprofen 600 every 6 hours for headache. You may add in Tylenol at 3 to 4-hour mark.  Continue taking acyclovir as prescribed.  This is the correct medication.  This will take some time to get better.  I will write you out for school for the next week.  Please follow-up in primary care doctor.  Return to the emergency department for any worsening symptoms.

## 2022-05-27 NOTE — ED Triage Notes (Signed)
Pt c/o headache, stiff neck, light sensitivity, decreased appetite since Thursday.

## 2022-05-27 NOTE — ED Notes (Signed)
Pt called charge RN in room requesting another room; pt states she does not like the sitters outside her room due to possible HIPPA violation; informed pt the sitters work for [employer] and are obligated to uphold HIPPA rules; pt then stated she would like something for pain;  Fredricka Bonine PA informed of pt request

## 2022-05-27 NOTE — ED Provider Notes (Signed)
[facility] EMERGENCY DEPARTMENT Provider Note   CSN: [id number] Arrival date & time: 05/27/22  1151     History Chief Complaint  Patient presents with   Headache    Rachel Stevens is a 41 y.o. 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  Patient was seen at urgent care 2 days ago and her Valtrex and she does not feel is helping.  She was switched to acyclovir and states that it still not helping and she was coming to the ED for IV antivirals.  She has not followed up with her primary care doctor for this.  She states that her symptoms are pretty consistent with a HSV flare.   Headache      Home Medications Prior to Admission medications   Medication Sig Start Date End Date Taking? Authorizing Provider  acyclovir (ZOVIRAX) 400 MG tablet Take 1 tablet (400 mg total) by mouth every 8 (eight) hours for 10 days. 05/25/22 06/04/22  Valentino Nose, NP  desipramine (NORPRAMIN) 25 MG tablet Take by mouth. 07/04/20 07/04/21  [provider]  desvenlafaxine (PRISTIQ) 25 MG 24 hr tablet Take by mouth. 04/14/22 07/27/22  [provider]  hydrochlorothiazide (MICROZIDE) 12.5 MG capsule Take 12.5 mg by mouth daily.    [provider]  hydrOXYzine (ATARAX) 10 MG tablet Take 1 tablet (10 mg total) by mouth 3 (three) times daily as needed for anxiety. Do not take while driving or operating heavy machinery 04/10/22   Valentino Nose, NP  meclizine (ANTIVERT) 25 MG tablet Take 1 tablet (25 mg total) by mouth 3 (three) times daily as needed for dizziness. 11/08/21   Particia Nearing, PA-C  medroxyPROGESTERone (DEPO-PROVERA) 150 MG/ML injection Inject 150 mg into the muscle every 3 (three) months.    [provider]  medroxyPROGESTERone (DEPO-PROVERA) 150 MG/ML injection Inject 150 mg into the  muscle every 3 (three) months.    [provider]  ondansetron (ZOFRAN-ODT) 4 MG disintegrating tablet Take 1 tablet (4 mg total) by mouth every 8 (eight) hours as needed for nausea or vomiting. Patient not taking: Reported on 03/27/2022 11/08/21   Particia Nearing, PA-C  pantoprazole (PROTONIX) 20 MG tablet  01/08/22   [provider]  propranolol (INDERAL) 10 MG tablet Take by mouth. 04/14/22 04/14/23  [provider]  rizatriptan (MAXALT) 10 MG tablet Take by mouth. 05/13/22   [provider]  topiramate (TOPAMAX) 25 MG tablet Take 25 mg by mouth at bedtime. 05/13/22   [provider]      Allergies    Bee venom, Latex, and Penicillins    Review of Systems   Review of Systems  Neurological:  Positive for headaches.  All other systems reviewed and are negative.   Physical Exam Updated Vital Signs BP 131/85   Pulse 87   Temp 98.4 F (36.9 C) (Oral)   Resp 18   Ht 5\' 6"  (1.676 m)   Wt 86.2 kg   SpO2 100%   BMI 30.67 kg/m  Physical Exam Vitals and nursing note reviewed.  Constitutional:      General: She is not in acute distress.    Appearance: Normal appearance.  HENT:     Head: Normocephalic and atraumatic.  Eyes:     General:        Right eye: No discharge.  Left eye: No discharge.  Cardiovascular:     Comments: Regular rate and rhythm.  S1/S2 are distinct without any evidence of murmur, rubs, or gallops.  Radial pulses are 2+ bilaterally.  Dorsalis pedis pulses are 2+ bilaterally.  No evidence of pedal edema. Pulmonary:     Comments: Clear to auscultation bilaterally.  Normal effort.  No respiratory distress.  No evidence of wheezes, rales, or rhonchi heard throughout. Abdominal:     General: Abdomen is flat. Bowel sounds are normal. There is no distension.     Tenderness: There is no abdominal tenderness. There is no guarding or rebound.  Musculoskeletal:        General: Normal range of motion.     Cervical back:  Neck supple.  Skin:    General: Skin is warm and dry.     Findings: No rash.  Neurological:     General: No focal deficit present.     Mental Status: She is alert.     Comments: Cranial nerves II through XII are intact.  5/5 strength to the upper and lower extremities.  Normal sensation to the upper and lower extremities.  Talking in complete sentences.  Psychiatric:        Mood and Affect: Mood normal.        Behavior: Behavior normal.     ED Results / Procedures / Treatments   Labs (all labs ordered are listed, but only abnormal results are displayed) Labs Reviewed  CBC WITH DIFFERENTIAL/PLATELET  BASIC METABOLIC PANEL    EKG None  Radiology CT Head Wo Contrast  Result Date: 05/27/2022 CLINICAL DATA:  Headache, sudden, severe EXAM: CT HEAD WITHOUT CONTRAST TECHNIQUE: Contiguous axial images were obtained from the base of the skull through the vertex without intravenous contrast. RADIATION DOSE REDUCTION: This exam was performed according to the departmental dose-optimization program which includes automated exposure control, adjustment of the mA and/or kV according to patient size and/or use of iterative reconstruction technique. COMPARISON:  Head CT 06/25/2021 FINDINGS: Brain: No evidence of acute intracranial hemorrhage or extra-axial collection. No loss of gray-white matter differentiation.Patent basal cisterns.The ventricles are normal in size. Vascular: No hyperdense vessel. Skull: Negative. Sinuses/Orbits: No acute finding. Other: None. IMPRESSION: No acute intracranial abnormality. Electronically Signed   By: Caprice Renshaw M.D.   On: 05/27/2022 13:22    Procedures Procedures    Medications Ordered in ED Medications  prochlorperazine (COMPAZINE) injection 10 mg (10 mg Intramuscular Given 05/27/22 1343)  diphenhydrAMINE (BENADRYL) capsule 25 mg (25 mg Oral Given 05/27/22 1342)    ED Course/ Medical Decision Making/ A&P                           Medical Decision  Making Rachel Stevens is a 41 y.o. female patient who presents to the emergency department for further evaluation of headache and burning sensation to the vagina.  She states this is typical to her flare.  She states has been having sweets and is currently in school which is stressful.  These are known triggers for her.  She has been taking acyclovir for couple days with little relief.  We will get some basic labs and CT head to further evaluate of the headache and plan to reassess.  I will also give her a migraine cocktail.  Patient feeling somewhat better.  We a long discussion about goal-directed therapy for herpes simplex virus acute treatment.  Patient is on the correct treatment and in  this instance IV antivirals are not indicated at this time.  She understands.  She will follow-up with her primary care doctor for further evaluation.  I instructed her for her to take ibuprofen or Tylenol for pain control.  Strict return precautions were discussed.  She is safe for discharge.   Amount and/or Complexity of Data Reviewed Labs: ordered. Radiology: ordered.  Risk Prescription drug management.   Final Clinical Impression(s) / ED Diagnoses Final diagnoses:  Acute nonintractable headache, unspecified headache type    Rx / DC Orders ED Discharge Orders     None         Teressa Lower, [place] 05/27/22 1438    Gloris Manchester, MD 05/30/22 [phone]

## 2022-05-27 NOTE — ED Notes (Signed)
Patient states any noise or light increases the pain in her head. Patient asked to be moved into a different room due to the room being a "psych" room and there is people sitting outside. Patient educated on policy of room availability. Patient states she wanted to leave if this is a psych room. Patient told by this nurse that this was the last room available. Patient agreed to stay and be seen by a provider.

## 2022-06-26 ENCOUNTER — Ambulatory Visit
Admission: RE | Admit: 2022-06-26 | Discharge: 2022-06-26 | Disposition: A | Discharge status: Home / Self Care | Payer: BC Managed Care – PPO | Source: Ambulatory Visit | Attending: Emergency Medicine | Admitting: Emergency Medicine

## 2022-06-26 VITALS — BP 117/80 | HR 77 | Temp 98.5°F | Resp 18

## 2022-06-26 DIAGNOSIS — B9689 Other specified bacterial agents as the cause of diseases classified elsewhere: Secondary | ICD-10-CM

## 2022-06-26 DIAGNOSIS — B3731 Acute candidiasis of vulva and vagina: Secondary | ICD-10-CM

## 2022-06-26 DIAGNOSIS — N760 Acute vaginitis: Secondary | ICD-10-CM | POA: Diagnosis not present

## 2022-06-26 DIAGNOSIS — Z113 Encounter for screening for infections with a predominantly sexual mode of transmission: Secondary | ICD-10-CM | POA: Diagnosis not present

## 2022-06-26 LAB — POCT URINALYSIS DIP (MANUAL ENTRY)
Bilirubin, UA: NEGATIVE
Glucose, UA: NEGATIVE mg/dL
Ketones, POC UA: NEGATIVE mg/dL
Leukocytes, UA: NEGATIVE
Nitrite, UA: NEGATIVE
Protein Ur, POC: NEGATIVE mg/dL
Spec Grav, UA: 1.025 (ref 1.010–1.025)
Urobilinogen, UA: 0.2 E.U./dL
pH, UA: 5 (ref 5.0–8.0)

## 2022-06-26 LAB — POCT URINE PREGNANCY: Preg Test, Ur: NEGATIVE

## 2022-06-26 MED ORDER — METRONIDAZOLE 0.75 % VA GEL: VAGINAL | 0 refills | Status: DC

## 2022-06-26 MED ORDER — FLUCONAZOLE 150 MG PO TABS: 150.0000 mg | ORAL_TABLET | Freq: Once | ORAL | 1 refills | Status: AC

## 2022-06-26 NOTE — Discharge Instructions (Addendum)
Start the MetroGel and take the Diflucan today.  Give [place] a working phone number so that we can contact you if needed. Refrain from sexual contact until all of your labs have come back, symptoms have resolved, and your partner(s) are treated if necessary. Return to the ER if you get worse, have a fever >100.4, or for any concerns.   Go to www.goodrx.com  or www.costplusdrugs.com to look up your medications. This will give you a list of where you can find your prescriptions at the most affordable prices. Or ask the pharmacist what the cash price is, or if they have any other discount programs available to help make your medication more affordable. This can be less expensive than what you would pay with insurance.

## 2022-06-26 NOTE — ED Provider Notes (Signed)
HPI  SUBJECTIVE:  Rachel Stevens is a 41 y.o. female who presents with 9 days of vaginal itching, odor.  She reports herpetic lesions, but states that they are healing over.  She also reports odorous urine.  No discharge, vaginal bleeding, other urinary complaints.  She is in a long-term monogamous relationship with a female, who is asymptomatic.  She is not sure if he is seeing anybody else, and would like to be checked for STDs.  Symptoms started after they used a latex condom.  She states that she is allergic to latex.  She tried Aveeno oatmeal baths, ice packs, oregano/olive oil with improvement in her symptoms.  Symptoms are worse with palpation and scratching.  No antipyretic in the past 6 hours.  No recent antibiotics.  No fevers, abdominal, pelvic or back pain.  She has a past medical history of HSV, trichomonas, frequent BV,  yeast vaginitis, UTI.  No history of gonorrhea, chlamydia, HIV, syphilis, diabetes, pyelonephritis.  LMP: June.  She missed the last Depo shot.  Not sure if she could be pregnant.  PCP: [facility] primary [facility].   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   Hypertension    Irregular periods 01/31/2015   Meniere disease    Missed period 11/01/2014   Patient desires pregnancy 11/01/2014   Pelvic floor tension    Vaginal discharge 11/01/2014   Vulvar pain    Weight gain 11/01/2014    Past Surgical History:  Procedure Laterality Date   BREAST REDUCTION SURGERY     REDUCTION MAMMAPLASTY Right     Family History  Problem Relation Age of Onset   Hypertension Mother    Heart disease Mother    Cancer Father        throat   Pancreatic cancer Father    Congestive Heart Failure Brother    Heart disease Maternal Grandmother    Hypertension Maternal Grandmother    Cancer Maternal Grandfather    Hypertension Maternal Grandfather    Cancer Paternal  Grandmother        breast   Breast cancer Maternal Aunt     Social History   Tobacco Use   Smoking status: Never   Smokeless tobacco: Never  Vaping Use   Vaping Use: Never used  Substance Use Topics   Alcohol use: Not Currently   Drug use: No    No current facility-administered medications for this encounter.  Current Outpatient Medications:    fluconazole (DIFLUCAN) 150 MG tablet, Take 1 tablet (150 mg total) by mouth once for 1 dose. 1 tab po x 1. May repeat in 72 hours if no improvement, Disp: 2 tablet, Rfl: 1   metroNIDAZOLE (METROGEL) 0.75 % vaginal gel, 1 applicator full pv qhs x 5 days, Disp: 70 g, Rfl: 0   desipramine (NORPRAMIN) 25 MG tablet, Take by mouth., Disp: , Rfl:    desvenlafaxine (PRISTIQ) 25 MG 24 hr tablet, Take by mouth., Disp: , Rfl:    hydrochlorothiazide (MICROZIDE) 12.5 MG capsule, Take 12.5 mg by mouth daily., Disp: , Rfl:    hydrOXYzine (ATARAX) 10 MG tablet, Take 1 tablet (10 mg total) by mouth 3 (three) times daily as needed for anxiety. Do not take while driving or operating heavy machinery, Disp: 30 tablet, Rfl: 0   meclizine (ANTIVERT) 25 MG tablet, Take 1 tablet (25 mg total)  by mouth 3 (three) times daily as needed for dizziness., Disp: 30 tablet, Rfl: 0   medroxyPROGESTERone (DEPO-PROVERA) 150 MG/ML injection, Inject 150 mg into the muscle every 3 (three) months., Disp: , Rfl:    medroxyPROGESTERone (DEPO-PROVERA) 150 MG/ML injection, Inject 150 mg into the muscle every 3 (three) months., Disp: , Rfl:    ondansetron (ZOFRAN-ODT) 4 MG disintegrating tablet, Take 1 tablet (4 mg total) by mouth every 8 (eight) hours as needed for nausea or vomiting. (Patient not taking: Reported on 03/27/2022), Disp: 15 tablet, Rfl: 0   pantoprazole (PROTONIX) 20 MG tablet, , Disp: , Rfl:    propranolol (INDERAL) 10 MG tablet, Take by mouth., Disp: , Rfl:    rizatriptan (MAXALT) 10 MG tablet, Take by mouth., Disp: , Rfl:    topiramate (TOPAMAX) 25 MG tablet, Take 25 mg  by mouth at bedtime., Disp: , Rfl:   Allergies  Allergen Reactions   Bee Venom Other (See Comments)    blisters   Latex Swelling   Penicillins Hives    .Has patient had a PCN reaction causing immediate rash, facial/tongue/throat swelling, SOB or lightheadedness with hypotension: Yes Has patient had a PCN reaction causing severe rash involving mucus membranes or skin necrosis: No Has patient had a PCN reaction that required hospitalization: No Has patient had a PCN reaction occurring within the last 10 years: No If all of the above answers are "NO", then may proceed with Cephalosporin use.      ROS  As noted in HPI.   Physical Exam  BP 117/80   Pulse 77   Temp 98.5 F (36.9 C)   Resp 18   LMP  (LMP Unknown)   SpO2 98%   Constitutional: Well developed, well nourished, no acute distress Eyes:  EOMI, conjunctiva normal bilaterally HENT: Normocephalic, atraumatic,mucus membranes moist Respiratory: Normal inspiratory effort Cardiovascular: Normal rate GI: nondistended soft, nontender. No suprapubic tenderness  back: No CVA tenderness GU: External genitalia normal.  No swelling, herpetic lesions on the inner or outer labia.  Positive extensive thin white discharge.  Normal vaginal mucosa.  Normal os.  thick oderous  white vaginal discharge.  Chaperone present during exam skin: No rash, skin intact Musculoskeletal: no deformities Neurologic: Alert & oriented x 3, no focal neuro deficits Psychiatric: Speech and behavior appropriate   ED Course   Medications - No data to display  Orders Placed This Encounter  Procedures   Pelvic exam    Standing Status:   Standing    Number of Occurrences:   1   HIV Antibody (routine testing w rflx)    Standing Status:   Standing    Number of Occurrences:   1   RPR    Standing Status:   Standing    Number of Occurrences:   1   POCT urinalysis dipstick (new)    Standing Status:   Standing    Number of Occurrences:   1   POCT urine  pregnancy    Standing Status:   Standing    Number of Occurrences:   1    Results for orders placed or performed during the hospital encounter of 06/26/22 (from the past 24 hour(s))  POCT urinalysis dipstick (new)     Status: Abnormal   Collection Time: 06/26/22 11:38 AM  Result Value Ref Range   Color, UA yellow yellow   Clarity, UA clear clear   Glucose, UA negative negative mg/dL   Bilirubin, UA negative negative   Ketones, POC UA  negative negative mg/dL   Spec Grav, UA 0.786 7.544 - 1.025   Blood, UA trace-intact (A) negative   pH, UA 5.0 5.0 - 8.0   Protein Ur, POC negative negative mg/dL   Urobilinogen, UA 0.2 0.2 or 1.0 E.U./dL   Nitrite, UA Negative Negative   Leukocytes, UA Negative Negative  POCT urine pregnancy     Status: None   Collection Time: 06/26/22 11:38 AM  Result Value Ref Range   Preg Test, Ur Negative Negative   No results found.  ED Clinical Impression  1. Yeast vaginitis   2. Screening for STDs (sexually transmitted diseases)   3. BV (bacterial vaginosis)     ED Assessment/Plan    Checking vaginal swab for gonorrhea, chlamydia, trichomonas, BV, yeast.  UA, urine pregnancy.  HIV, RPR also sent.  UA with trace hematuria, but negative for nitrites or esterase.  Urine pregnancy negative.  H&P most c/w yeast infection versus BV.   Will not treat empirically for STDs now. Will send home with MetroGel and Diflucan.  Patient does not tolerate Flagyl orally.  Advised pt to refrain from sexual contact until she knows lab results, symptoms resolve, and partner(s) are treated if necessary. Pt provided working phone number. Follow-up with PCP as needed. Discussed labs, MDM, plan and followup with patient. Pt agrees with plan.   Meds ordered this encounter  Medications   fluconazole (DIFLUCAN) 150 MG tablet    Sig: Take 1 tablet (150 mg total) by mouth once for 1 dose. 1 tab po x 1. May repeat in 72 hours if no improvement    Dispense:  2 tablet    Refill:   1   metroNIDAZOLE (METROGEL) 0.75 % vaginal gel    Sig: 1 applicator full pv qhs x 5 days    Dispense:  70 g    Refill:  0    *This clinic note was created using Scientist, clinical (histocompatibility and immunogenetics). Therefore, there may be occasional mistakes despite careful proofreading.  ?     Domenick Gong, MD 06/26/22 1145

## 2022-06-26 NOTE — ED Triage Notes (Signed)
Pt presents with vaginal itching and foul smelling urine for past week, would also like hiv and ipr testing as well

## 2022-06-27 LAB — HIV ANTIBODY (ROUTINE TESTING W REFLEX): HIV Screen 4th Generation wRfx: NONREACTIVE

## 2022-06-27 LAB — RPR: RPR Ser Ql: NONREACTIVE

## 2022-06-29 LAB — CERVICOVAGINAL ANCILLARY ONLY
Bacterial Vaginitis (gardnerella): POSITIVE — AB
Candida Glabrata: NEGATIVE
Candida Vaginitis: POSITIVE — AB
Chlamydia: NEGATIVE
Comment: NEGATIVE
Comment: NEGATIVE
Comment: NEGATIVE
Comment: NEGATIVE
Comment: NEGATIVE
Comment: NORMAL
Neisseria Gonorrhea: NEGATIVE
Trichomonas: NEGATIVE

## 2022-07-08 ENCOUNTER — Ambulatory Visit
Admission: RE | Admit: 2022-07-08 | Discharge: 2022-07-08 | Disposition: A | Discharge status: Home / Self Care | Payer: BC Managed Care – PPO | Source: Ambulatory Visit | Attending: Family Medicine | Admitting: Family Medicine

## 2022-07-08 ENCOUNTER — Ambulatory Visit: Payer: BC Managed Care – PPO

## 2022-07-08 VITALS — BP 130/84 | HR 72 | Temp 98.7°F | Resp 16

## 2022-07-08 DIAGNOSIS — N760 Acute vaginitis: Secondary | ICD-10-CM | POA: Diagnosis not present

## 2022-07-08 DIAGNOSIS — R109 Unspecified abdominal pain: Secondary | ICD-10-CM | POA: Insufficient documentation

## 2022-07-08 DIAGNOSIS — R195 Other fecal abnormalities: Secondary | ICD-10-CM | POA: Insufficient documentation

## 2022-07-08 DIAGNOSIS — Z113 Encounter for screening for infections with a predominantly sexual mode of transmission: Secondary | ICD-10-CM | POA: Diagnosis not present

## 2022-07-08 MED ORDER — ONDANSETRON 4 MG PO TBDP: 4.0000 mg | ORAL_TABLET | Freq: Three times a day (TID) | ORAL | 0 refills | Status: DC | PRN

## 2022-07-08 MED ORDER — ONDANSETRON 4 MG PO TBDP
4.0000 mg | ORAL_TABLET | Freq: Once | ORAL | Status: AC
  Administered 2022-07-08: 4 mg via ORAL

## 2022-07-08 MED ORDER — MEBENDAZOLE 100 MG PO CHEW: 100.0000 mg | CHEWABLE_TABLET | Freq: Two times a day (BID) | ORAL | 0 refills | Status: DC

## 2022-07-08 NOTE — ED Provider Notes (Signed)
[facility]    CSN: [id number] Arrival date & time: 07/08/22  1448      History   Chief Complaint Chief Complaint  Patient presents with   Follow-up    Follow up from last visit still experiencing minor symptoms - Entered by patient    HPI Rachel Stevens is a 41 y.o. female.   Patient presenting today with ongoing white vaginal discharge, tested +2 weeks ago for yeast and BV and completed treatment with some improvement in symptoms.  She also did a colon cleanse last week and found a worm in her stool.  States she is having some mild abdominal pain, fatigue, occasional nausea that sometimes improves with a bowel movement.  Also noticing some rectal area itching at times.  Denies fever, chills, unexpected weight loss, decreased appetite, melena, vomiting.    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   Hypertension    Irregular periods 01/31/2015   Meniere disease    Missed period 11/01/2014   Patient desires pregnancy 11/01/2014   Pelvic floor tension    Vaginal discharge 11/01/2014   Vulvar pain    Weight gain 11/01/2014    Patient Active Problem List   Diagnosis Date Noted   Anxiety 08/19/2020   Breast mass in female 03/04/2020   Elevated blood pressure reading 02/21/2020   Obesity BMI=30.9  160 lbs 07/05/2019   Pelvic "burning" x3 years 07/05/2019   History of reduction surgery of right breast 2009 07/05/2019   Depression 07/05/2015   Dizziness of unknown cause 07/05/2015   Fatigue 07/05/2015   Vitamin B12 deficiency 07/05/2015   Vitamin D insufficiency 07/05/2015   Vulvar pain 07/05/2015   Irregular periods 01/31/2015   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 Authorizing Provider  mebendazole (VERMOX) 100 MG chewable tablet Chew 1 tablet (100 mg total) by mouth 2 (two) times daily. 07/08/22  Yes Volney American, PA-C  ondansetron (ZOFRAN-ODT) 4 MG disintegrating tablet Take 1 tablet (4 mg total) by mouth every 8 (eight) hours as needed for nausea or vomiting. 07/08/22  Yes Volney American, PA-C  desipramine (NORPRAMIN) 25 MG tablet Take by mouth. 07/04/20 07/04/21  [provider]  desvenlafaxine (PRISTIQ) 25 MG 24 hr tablet Take by mouth. 04/14/22 07/27/22  [provider]  hydrochlorothiazide (MICROZIDE) 12.5 MG capsule Take 12.5 mg by mouth daily.    [provider]  hydrOXYzine (ATARAX) 10 MG tablet Take 1 tablet (10 mg total) by mouth 3 (three) times daily as needed for anxiety. Do not take while driving or operating heavy machinery 04/10/22   Eulogio Bear, NP  meclizine (  ANTIVERT) 25 MG tablet Take 1 tablet (25 mg total) by mouth 3 (three) times daily as needed for dizziness. 11/08/21   Particia Nearing, PA-C  medroxyPROGESTERone (DEPO-PROVERA) 150 MG/ML injection Inject 150 mg into the muscle every 3 (three) months.    [provider]  medroxyPROGESTERone (DEPO-PROVERA) 150 MG/ML injection Inject 150 mg into the muscle every 3 (three) months.    [provider]  metroNIDAZOLE (METROGEL) 0.75 % vaginal gel 1 applicator full pv qhs x 5 days 06/26/22   Domenick Gong, MD  ondansetron (ZOFRAN-ODT) 4 MG disintegrating tablet Take 1 tablet (4 mg total) by mouth every 8 (eight) hours as needed for nausea or vomiting. Patient not taking: Reported on 03/27/2022 11/08/21   Particia Nearing, PA-C  pantoprazole (PROTONIX)  20 MG tablet  01/08/22   [provider]  propranolol (INDERAL) 10 MG tablet Take by mouth. 04/14/22 04/14/23  [provider]  rizatriptan (MAXALT) 10 MG tablet Take by mouth. 05/13/22   [provider]  topiramate (TOPAMAX) 25 MG tablet Take 25 mg by mouth at bedtime. 05/13/22   [provider]    Family History Family History  Problem Relation Age of Onset   Hypertension Mother    Heart disease Mother    Cancer Father        throat   Pancreatic cancer Father    Congestive Heart Failure Brother    Heart disease Maternal Grandmother    Hypertension Maternal Grandmother    Cancer Maternal Grandfather    Hypertension Maternal Grandfather    Cancer Paternal Grandmother        breast   Breast cancer Maternal Aunt     Social History Social History   Tobacco Use   Smoking status: Never   Smokeless tobacco: Never  Vaping Use   Vaping Use: Never used  Substance Use Topics   Alcohol use: Not Currently   Drug use: No     Allergies   Bee venom, Latex, and Penicillins   Review of Systems Review of Systems PER HPI  Physical Exam Triage Vital Signs ED Triage Vitals  Enc Vitals Group     BP 07/08/22 1512 130/84     Pulse Rate 07/08/22 1512 72     Resp 07/08/22 1512 16     Temp 07/08/22 1512 98.7 F (37.1 C)     Temp Source 07/08/22 1512 Oral     SpO2 07/08/22 1512 95 %     Weight --      Height --      Head Circumference --      Peak Flow --      Pain Score 07/08/22 1514 7     Pain Loc --      Pain Edu? --      Excl. in GC? --    No data found.  Updated Vital Signs BP 130/84 (BP Location: Right Arm)   Pulse 72   Temp 98.7 F (37.1 C) (Oral)   Resp 16   LMP  (LMP Unknown)   SpO2 95%   Visual Acuity Right Eye Distance:   Left Eye Distance:   Bilateral Distance:    Right Eye Near:   Left Eye Near:    Bilateral Near:     Physical Exam Vitals and nursing note reviewed. Exam conducted with a chaperone present.   Constitutional:      Appearance: Normal appearance. She is not ill-appearing.  HENT:     Head: Atraumatic.  Mouth/Throat:     Mouth: Mucous membranes are moist.     Pharynx: Oropharynx is clear.  Eyes:     Extraocular Movements: Extraocular movements intact.     Conjunctiva/sclera: Conjunctivae normal.  Cardiovascular:     Rate and Rhythm: Normal rate and regular rhythm.     Heart sounds: Normal heart sounds.  Pulmonary:     Effort: Pulmonary effort is normal.     Breath sounds: Normal breath sounds.  Genitourinary:    Comments: Vulvar erythema, white clumpy discharge present.  Otherwise no obvious abnormalities Musculoskeletal:        General: Normal range of motion.     Cervical back: Normal range of motion and neck supple.  Skin:    General: Skin is warm and dry.  Neurological:     Mental Status: She is alert and oriented to person, place, and time.     Motor: No weakness.     Gait: Gait normal.  Psychiatric:        Mood and Affect: Mood normal.        Thought Content: Thought content normal.        Judgment: Judgment normal.      UC Treatments / Results  Labs (all labs ordered are listed, but only abnormal results are displayed) Labs Reviewed  CERVICOVAGINAL ANCILLARY ONLY    EKG   Radiology No results found.  Procedures Procedures (including critical care time)  Medications Ordered in UC Medications  ondansetron (ZOFRAN-ODT) disintegrating tablet 4 mg (4 mg Oral Given 07/08/22 1557)    Initial Impression / Assessment and Plan / UC Course  I have reviewed the triage vital signs and the nursing notes.  Pertinent labs & imaging results that were available during my care of the patient were reviewed by me and considered in my medical decision making (see chart for details).     Stool kit given, she wishes to take at home to provide sample and will bring back.  Will test for ova and parasites, mebendazole sent as she found a worm in her stool and  has the symptoms of a parasite infection but discussed not to start the medication until after providing [place] a stool sample.  We will also recollect vaginal swab to make sure yeast and BV have cleared.  Retreat as needed based on these results.  Return for worsening symptoms.  Final Clinical Impressions(s) / UC Diagnoses   Final diagnoses:  Acute vaginitis  Nonspecific abnormal finding in stool contents     Discharge Instructions      Please do not start the mebendazole until you have provided [place] a stool sample to send out for testing for parasites.    ED Prescriptions     Medication Sig Dispense Auth. Provider   mebendazole (VERMOX) 100 MG chewable tablet Chew 1 tablet (100 mg total) by mouth 2 (two) times daily. 6 tablet Volney American, PA-C   ondansetron (ZOFRAN-ODT) 4 MG disintegrating tablet Take 1 tablet (4 mg total) by mouth every 8 (eight) hours as needed for nausea or vomiting. 20 tablet Volney American, [place]      PDMP not reviewed this encounter.   Volney American, [place] 07/08/22 1600

## 2022-07-08 NOTE — ED Triage Notes (Signed)
Hx of BV and yeast states the vaginal area has gotten a little better, however is still having white discharge.  Was seen 9/29.  States her rectum is itching now.  States she did a colon cleanse last week and found a worm in her stool.  States her stomach hurts, but feels better when she has a BM.

## 2022-07-08 NOTE — Discharge Instructions (Addendum)
Please do not start the mebendazole until you have provided [place] a stool sample to send out for testing for parasites.

## 2022-07-09 LAB — CERVICOVAGINAL ANCILLARY ONLY
Bacterial Vaginitis (gardnerella): NEGATIVE
Candida Glabrata: NEGATIVE
Candida Vaginitis: NEGATIVE
Chlamydia: NEGATIVE
Comment: NEGATIVE
Comment: NEGATIVE
Comment: NEGATIVE
Comment: NEGATIVE
Comment: NEGATIVE
Comment: NORMAL
Neisseria Gonorrhea: NEGATIVE
Trichomonas: NEGATIVE

## 2022-07-14 LAB — OVA + PARASITE EXAM

## 2022-07-14 LAB — O&P RESULT

## 2022-08-04 ENCOUNTER — Other Ambulatory Visit: Payer: Self-pay

## 2022-08-04 ENCOUNTER — Encounter (HOSPITAL_COMMUNITY): Payer: Self-pay

## 2022-08-04 ENCOUNTER — Emergency Department (HOSPITAL_COMMUNITY)
Admission: EM | Admit: 2022-08-04 | Discharge: 2022-08-05 | Disposition: A | Discharge status: Home / Self Care | Payer: BC Managed Care – PPO | Attending: Emergency Medicine | Admitting: Emergency Medicine

## 2022-08-04 DIAGNOSIS — R202 Paresthesia of skin: Secondary | ICD-10-CM | POA: Insufficient documentation

## 2022-08-04 DIAGNOSIS — Z91040 Latex allergy status: Secondary | ICD-10-CM | POA: Insufficient documentation

## 2022-08-04 DIAGNOSIS — R42 Dizziness and giddiness: Secondary | ICD-10-CM | POA: Diagnosis present

## 2022-08-04 LAB — URINALYSIS, ROUTINE W REFLEX MICROSCOPIC
Bacteria, UA: NONE SEEN
Bilirubin Urine: NEGATIVE
Glucose, UA: NEGATIVE mg/dL
Ketones, ur: NEGATIVE mg/dL
Leukocytes,Ua: NEGATIVE
Nitrite: NEGATIVE
Protein, ur: 30 mg/dL — AB
RBC / HPF: >50 RBC/hpf — ABNORMAL HIGH (ref 0–5)
Specific Gravity, Urine: 1.01 (ref 1.005–1.030)
pH: 6 (ref 5.0–8.0)

## 2022-08-04 LAB — BASIC METABOLIC PANEL
Anion gap: 5 (ref 5–15)
BUN: 10 mg/dL (ref 6–20)
CO2: 26 mmol/L (ref 22–32)
Calcium: 8.3 mg/dL — ABNORMAL LOW (ref 8.9–10.3)
Chloride: 107 mmol/L (ref 98–111)
Creatinine, Ser: 0.83 mg/dL (ref 0.44–1.00)
GFR, Estimated: >60 mL/min (ref >60)
Glucose, Bld: 99 mg/dL (ref 70–99)
Potassium: 3.6 mmol/L (ref 3.5–5.1)
Sodium: 138 mmol/L (ref 135–145)

## 2022-08-04 LAB — CBC
HCT: 35.7 % — ABNORMAL LOW (ref 36.0–46.0)
Hemoglobin: 11.6 g/dL — ABNORMAL LOW (ref 12.0–15.0)
MCH: 28.2 pg (ref 26.0–34.0)
MCHC: 32.5 g/dL (ref 30.0–36.0)
MCV: 86.7 fL (ref 80.0–100.0)
Platelets: 300 10*3/uL (ref 150–400)
RBC: 4.12 MIL/uL (ref 3.87–5.11)
RDW: 13.2 % (ref 11.5–15.5)
WBC: 10 10*3/uL (ref 4.0–10.5)
nRBC: 0 % (ref 0.0–0.2)

## 2022-08-04 LAB — PREGNANCY, URINE: Preg Test, Ur: NEGATIVE

## 2022-08-04 NOTE — ED Triage Notes (Signed)
POV from home. Ambulatory to triage room from lobby.  Says that she is feeling numbness in her face since Sunday. Says that it was in her left side and now it is on her right side of her face. Says that the change happened around 7pm while she was studying.  Also says that she feels dizzy and nauseated. Hx of vertigo but says this feels worse.

## 2022-08-04 NOTE — ED Notes (Signed)
Denies being able to provide urine sample

## 2022-08-04 NOTE — ED Provider Notes (Signed)
[facility] EMERGENCY DEPARTMENT Provider Note   CSN: [id number] Arrival date & time: 08/04/22  2136     History {Add pertinent medical, surgical, social history, OB history to HPI:1} Chief Complaint  Patient presents with   Dizziness   Facial Numbness     Rachel Stevens is a 41 y.o. female.  Patient presents for evaluation of dizziness and facial numbness.  Patient has had ongoing episodes of left-sided numbness including left facial numbness and dizziness.  She has been told she has Mnire's disease.  She recently saw neurology for this and no further work-up was recommended.  Tonight, however, she noticed that the numbness on her face switched from the left side to the right side.       Home Medications Prior to Admission medications   Medication Sig Start Date End Date Taking? Authorizing Provider  desipramine (NORPRAMIN) 25 MG tablet Take by mouth. 07/04/20 07/04/21  [provider]  desvenlafaxine (PRISTIQ) 25 MG 24 hr tablet Take by mouth. 04/14/22 07/27/22  [provider]  hydrochlorothiazide (MICROZIDE) 12.5 MG capsule Take 12.5 mg by mouth daily.    [provider]  hydrOXYzine (ATARAX) 10 MG tablet Take 1 tablet (10 mg total) by mouth 3 (three) times daily as needed for anxiety. Do not take while driving or operating heavy machinery 04/10/22   Eulogio Bear, NP  mebendazole (VERMOX) 100 MG chewable tablet Chew 1 tablet (100 mg total) by mouth 2 (two) times daily. 07/08/22   Volney American, PA-C  meclizine (ANTIVERT) 25 MG tablet Take 1 tablet (25 mg total) by mouth 3 (three) times daily as needed for dizziness. 11/08/21   Volney American, PA-C  medroxyPROGESTERone (DEPO-PROVERA) 150 MG/ML injection Inject 150 mg into the muscle every 3 (three) months.    [provider]  medroxyPROGESTERone (DEPO-PROVERA) 150 MG/ML injection Inject 150 mg into the muscle every 3 (three) months.    [provider]   metroNIDAZOLE (METROGEL) 0.75 % vaginal gel 1 applicator full pv qhs x 5 days 06/26/22   Melynda Ripple, MD  ondansetron (ZOFRAN-ODT) 4 MG disintegrating tablet Take 1 tablet (4 mg total) by mouth every 8 (eight) hours as needed for nausea or vomiting. Patient not taking: Reported on 03/27/2022 11/08/21   Volney American, PA-C  ondansetron (ZOFRAN-ODT) 4 MG disintegrating tablet Take 1 tablet (4 mg total) by mouth every 8 (eight) hours as needed for nausea or vomiting. 07/08/22   Volney American, PA-C  pantoprazole (PROTONIX) 20 MG tablet  01/08/22   [provider]  propranolol (INDERAL) 10 MG tablet Take by mouth. 04/14/22 04/14/23  [provider]  rizatriptan (MAXALT) 10 MG tablet Take by mouth. 05/13/22   [provider]  topiramate (TOPAMAX) 25 MG tablet Take 25 mg by mouth at bedtime. 05/13/22   [provider]      Allergies    Bee venom, Latex, and Penicillins    Review of Systems   Review of Systems  Physical Exam Updated Vital Signs BP 138/89 (BP Location: Right Arm)   Pulse 68   Temp 98.6 F (37 C) (Oral)   Resp 18   Ht 5\' 6"  (1.676 m)   Wt 87 kg   SpO2 99%   BMI 30.96 kg/m  Physical Exam Vitals and nursing note reviewed.  Constitutional:      General: She is not in acute distress.    Appearance: She is well-developed.  HENT:     Head: Normocephalic and  atraumatic.     Mouth/Throat:     Mouth: Mucous membranes are moist.  Eyes:     General: Vision grossly intact. Gaze aligned appropriately.     Extraocular Movements: Extraocular movements intact.     Conjunctiva/sclera: Conjunctivae normal.  Cardiovascular:     Rate and Rhythm: Normal rate and regular rhythm.     Pulses: Normal pulses.     Heart sounds: Normal heart sounds, S1 normal and S2 normal. No murmur heard.    No friction rub. No gallop.  Pulmonary:     Effort: Pulmonary effort is normal. No respiratory distress.     Breath sounds: Normal breath sounds.   Abdominal:     General: Bowel sounds are normal.     Palpations: Abdomen is soft.     Tenderness: There is no abdominal tenderness. There is no guarding or rebound.     Hernia: No hernia is present.  Musculoskeletal:        General: No swelling.     Cervical back: Full passive range of motion without pain, normal range of motion and neck supple. No spinous process tenderness or muscular tenderness. Normal range of motion.     Right lower leg: No edema.     Left lower leg: No edema.  Skin:    General: Skin is warm and dry.     Capillary Refill: Capillary refill takes less than 2 seconds.     Findings: No ecchymosis, erythema, rash or wound.  Neurological:     General: No focal deficit present.     Mental Status: She is alert and oriented to person, place, and time.     GCS: GCS eye subscore is 4. GCS verbal subscore is 5. GCS motor subscore is 6.     Cranial Nerves: Cranial nerves 2-12 are intact.     Sensory: Sensation is intact.     Motor: Motor function is intact.     Coordination: Coordination is intact.     Comments: Extraocular muscle movement: normal No visual field cut Pupils: equal and reactive both direct and consensual response is normal No nystagmus present    Sensory function is intact to light touch, pinprick Proprioception intact  Grip strength 5/5 symmetric in upper extremities No pronator drift Normal finger to nose bilaterally  Lower extremity strength 5/5 against gravity Normal heel to shin bilaterally  Gait: normal   Psychiatric:        Attention and Perception: Attention normal.        Mood and Affect: Mood normal.        Speech: Speech normal.        Behavior: Behavior normal.     ED Results / Procedures / Treatments   Labs (all labs ordered are listed, but only abnormal results are displayed) Labs Reviewed  BASIC METABOLIC PANEL - Abnormal; Notable for the following components:      Result Value   Calcium 8.3 (*)    All other components  within normal limits  CBC - Abnormal; Notable for the following components:   Hemoglobin 11.6 (*)    HCT 35.7 (*)    All other components within normal limits  URINALYSIS, ROUTINE W REFLEX MICROSCOPIC - Abnormal; Notable for the following components:   Hgb urine dipstick LARGE (*)    Protein, ur 30 (*)    RBC / HPF >50 (*)    All other components within normal limits  PREGNANCY, URINE  CBG MONITORING, ED    EKG None  Radiology No results found.  Procedures Procedures  {Document cardiac monitor, telemetry assessment procedure when appropriate:1}  Medications Ordered in ED Medications - No data to display  ED Course/ Medical Decision Making/ A&P                           Medical Decision Making Amount and/or Complexity of Data Reviewed Labs: ordered.   ***  {Document critical care time when appropriate:1} {Document review of labs and clinical decision tools ie heart score, Chads2Vasc2 etc:1}  {Document your independent review of radiology images, and any outside records:1} {Document your discussion with family members, caretakers, and with consultants:1} {Document social determinants of health affecting pt's care:1} {Document your decision making why or why not admission, treatments were needed:1} Final Clinical Impression(s) / ED Diagnoses Final diagnoses:  None    Rx / DC Orders ED Discharge Orders     None

## 2022-08-05 ENCOUNTER — Emergency Department (HOSPITAL_COMMUNITY): Payer: BC Managed Care – PPO

## 2022-08-05 NOTE — ED Notes (Signed)
Went over [employer]. All questions answered. Ambulatory to lobby without assist.

## 2022-08-08 ENCOUNTER — Emergency Department (HOSPITAL_COMMUNITY): Payer: BC Managed Care – PPO

## 2022-08-08 ENCOUNTER — Other Ambulatory Visit: Payer: Self-pay

## 2022-08-08 ENCOUNTER — Emergency Department (HOSPITAL_COMMUNITY)
Admission: EM | Admit: 2022-08-08 | Discharge: 2022-08-08 | Discharge status: Absent without leave | Payer: BC Managed Care – PPO

## 2022-08-08 ENCOUNTER — Emergency Department (HOSPITAL_COMMUNITY)
Admission: EM | Admit: 2022-08-08 | Discharge: 2022-08-08 | Disposition: A | Discharge status: Home / Self Care | Payer: BC Managed Care – PPO | Attending: Emergency Medicine | Admitting: Emergency Medicine

## 2022-08-08 ENCOUNTER — Encounter (HOSPITAL_COMMUNITY): Payer: Self-pay | Admitting: Emergency Medicine

## 2022-08-08 DIAGNOSIS — R200 Anesthesia of skin: Secondary | ICD-10-CM | POA: Insufficient documentation

## 2022-08-08 DIAGNOSIS — Z91040 Latex allergy status: Secondary | ICD-10-CM | POA: Insufficient documentation

## 2022-08-08 LAB — COMPREHENSIVE METABOLIC PANEL
ALT: 13 U/L (ref 0–44)
AST: 18 U/L (ref 15–41)
Albumin: 4.3 g/dL (ref 3.5–5.0)
Alkaline Phosphatase: 79 U/L (ref 38–126)
Anion gap: 7 (ref 5–15)
BUN: 9 mg/dL (ref 6–20)
CO2: 27 mmol/L (ref 22–32)
Calcium: 9.2 mg/dL (ref 8.9–10.3)
Chloride: 105 mmol/L (ref 98–111)
Creatinine, Ser: 0.74 mg/dL (ref 0.44–1.00)
GFR, Estimated: >60 mL/min (ref >60)
Glucose, Bld: 92 mg/dL (ref 70–99)
Potassium: 4 mmol/L (ref 3.5–5.1)
Sodium: 139 mmol/L (ref 135–145)
Total Bilirubin: 0.5 mg/dL (ref 0.3–1.2)
Total Protein: 7.9 g/dL (ref 6.5–8.1)

## 2022-08-08 LAB — TROPONIN I (HIGH SENSITIVITY)
Troponin I (High Sensitivity): 2 ng/L (ref <18)
Troponin I (High Sensitivity): 3 ng/L (ref <18)

## 2022-08-08 LAB — CBC WITH DIFFERENTIAL/PLATELET
Abs Immature Granulocytes: 0.05 10*3/uL (ref 0.00–0.07)
Basophils Absolute: 0.1 10*3/uL (ref 0.0–0.1)
Basophils Relative: 1 %
Eosinophils Absolute: 0.1 10*3/uL (ref 0.0–0.5)
Eosinophils Relative: 1 %
HCT: 38.4 % (ref 36.0–46.0)
Hemoglobin: 12.4 g/dL (ref 12.0–15.0)
Immature Granulocytes: 1 %
Lymphocytes Relative: 23 %
Lymphs Abs: 2.2 10*3/uL (ref 0.7–4.0)
MCH: 27.9 pg (ref 26.0–34.0)
MCHC: 32.3 g/dL (ref 30.0–36.0)
MCV: 86.5 fL (ref 80.0–100.0)
Monocytes Absolute: 0.6 10*3/uL (ref 0.1–1.0)
Monocytes Relative: 7 %
Neutro Abs: 6.6 10*3/uL (ref 1.7–7.7)
Neutrophils Relative %: 67 %
Platelets: 313 10*3/uL (ref 150–400)
RBC: 4.44 MIL/uL (ref 3.87–5.11)
RDW: 13 % (ref 11.5–15.5)
WBC: 9.6 10*3/uL (ref 4.0–10.5)
nRBC: 0 % (ref 0.0–0.2)

## 2022-08-08 MED ORDER — SODIUM CHLORIDE 0.9 % IV BOLUS
500.0000 mL | Freq: Once | INTRAVENOUS | Status: AC
  Administered 2022-08-08: 500 mL via INTRAVENOUS

## 2022-08-08 NOTE — ED Notes (Signed)
Ambulatory to restroom without assist.  

## 2022-08-08 NOTE — ED Notes (Signed)
Lab in room.

## 2022-08-08 NOTE — ED Provider Notes (Signed)
  [facility] EMERGENCY DEPARTMENT Provider Note   CSN: [id number] Arrival date & time: 08/08/22  1633     History {Add pertinent medical, surgical, social history, OB history to HPI:1} Chief Complaint  Patient presents with   Numbness    Rachel Stevens is a 41 y.o. female.  Patient with numbness to left face.  She has been evaluated by her neurologist for this.  And has been seen in the emergency department for before with normal CAT scan   Weakness      Home Medications Prior to Admission medications   Medication Sig Start Date End Date Taking? Authorizing Provider  hydrochlorothiazide (MICROZIDE) 12.5 MG capsule Take 12.5 mg by mouth daily.   Yes [provider]  ondansetron (ZOFRAN-ODT) 4 MG disintegrating tablet Take 1 tablet (4 mg total) by mouth every 8 (eight) hours as needed for nausea or vomiting. 07/08/22  Yes Particia Nearing, PA-C      Allergies    Bee venom, Latex, and Penicillins    Review of Systems   Review of Systems  Neurological:  Positive for weakness.    Physical Exam Updated Vital Signs BP (!) 141/97   Pulse 67   Temp 98.1 F (36.7 C) (Oral)   Resp 20   SpO2 100%  Physical Exam  ED Results / Procedures / Treatments   Labs (all labs ordered are listed, but only abnormal results are displayed) Labs Reviewed  CBC WITH DIFFERENTIAL/PLATELET  COMPREHENSIVE METABOLIC PANEL  TROPONIN I (HIGH SENSITIVITY)  TROPONIN I (HIGH SENSITIVITY)    EKG None  Radiology DG Chest Port 1 View  Result Date: 08/08/2022 CLINICAL DATA:  Shortness of breath left-sided numbness.  Nausea. EXAM: PORTABLE CHEST 1 VIEW COMPARISON:  02/21/2021 FINDINGS: The heart size and mediastinal contours are within normal limits. Both lungs are clear. The visualized skeletal structures are unremarkable. IMPRESSION: No active disease. Electronically Signed   By: Gaylyn Rong M.D.   On: 08/08/2022 17:50    Procedures Procedures  {Document cardiac  monitor, telemetry assessment procedure when appropriate:1}  Medications Ordered in ED Medications  sodium chloride 0.9 % bolus 500 mL (0 mLs Intravenous Stopped 08/08/22 1931)    ED Course/ Medical Decision Making/ A&P                           Medical Decision Making Amount and/or Complexity of Data Reviewed Labs: ordered. Radiology: ordered.   Patient with numbness to the face.  She is referred back to her neurologist and her blood pressure is minimally elevated she is sent back to her family doctor  {Document critical care time when appropriate:1} {Document review of labs and clinical decision tools ie heart score, Chads2Vasc2 etc:1}  {Document your independent review of radiology images, and any outside records:1} {Document your discussion with family members, caretakers, and with consultants:1} {Document social determinants of health affecting pt's care:1} {Document your decision making why or why not admission, treatments were needed:1} Final Clinical Impression(s) / ED Diagnoses Final diagnoses:  Numbness    Rx / DC Orders ED Discharge Orders     None

## 2022-08-08 NOTE — ED Triage Notes (Signed)
Pt reports numbness and tingling on her left side that started 1 week ago. Pt reports nausea and unable to eat or drink. Pt also reporting trouble with sleeping.

## 2022-08-08 NOTE — ED Notes (Signed)
Edp in room  

## 2022-08-08 NOTE — Discharge Instructions (Signed)
Follow-up with your neurologist for your numbness and follow-up with your family doctor for your blood pressure

## 2022-08-08 NOTE — ED Notes (Signed)
Went over [employer] with patient. Verbalized understanding regarding follow ups. Ambulatory to lobby without assist.

## 2022-08-08 NOTE — ED Notes (Signed)
Warn blanket provided

## 2022-08-19 ENCOUNTER — Ambulatory Visit: Payer: BC Managed Care – PPO

## 2022-08-19 ENCOUNTER — Ambulatory Visit
Admission: EM | Admit: 2022-08-19 | Discharge: 2022-08-19 | Disposition: A | Discharge status: Home / Self Care | Payer: BC Managed Care – PPO | Attending: Family Medicine | Admitting: Family Medicine

## 2022-08-19 ENCOUNTER — Encounter: Payer: Self-pay | Admitting: Emergency Medicine

## 2022-08-19 DIAGNOSIS — L819 Disorder of pigmentation, unspecified: Secondary | ICD-10-CM | POA: Diagnosis not present

## 2022-08-19 MED ORDER — TRIAMCINOLONE ACETONIDE 0.1 % EX CREA: 1.0000 | TOPICAL_CREAM | Freq: Two times a day (BID) | CUTANEOUS | 0 refills | Status: DC | PRN

## 2022-08-19 NOTE — ED Triage Notes (Signed)
Had an EKG done on 11/11.  Patient still has marks from were the EKG leads are.  States she has had several EKGs done before and never had any problems with the leads.

## 2022-08-19 NOTE — ED Provider Notes (Signed)
[facility]    CSN: [id number] Arrival date & time: 08/19/22  1355      History   Chief Complaint No chief complaint on file.   HPI Rachel Stevens is a 41 y.o. female.   Patient presenting today with hyperpigmented regions to her abdomen and chest the exact shape of EKG leads that she had placed on 08/08/2022.  States she has had numerous EKGs in the past and has never had marks left by the leads and does not recall ever reacting to any adhesives in the past.  She denies any itching, pain, changes since onset.  Has not tried anything for symptoms.    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   Hypertension    Irregular periods 01/31/2015   Meniere disease    Missed period 11/01/2014   Patient desires pregnancy 11/01/2014   Pelvic floor tension    Vaginal discharge 11/01/2014   Vulvar pain    Weight gain 11/01/2014    Patient Active Problem List   Diagnosis Date Noted   Anxiety 08/19/2020   Breast mass in female 03/04/2020   Elevated blood pressure reading 02/21/2020   Obesity BMI=30.9  160 lbs 07/05/2019   Pelvic "burning" x3 years 07/05/2019   History of reduction surgery of right breast 2009 07/05/2019   Depression 07/05/2015   Dizziness of unknown cause 07/05/2015   Fatigue 07/05/2015   Vitamin B12 deficiency 07/05/2015   Vitamin D insufficiency 07/05/2015   Vulvar pain 07/05/2015   Irregular periods 01/31/2015   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 Authorizing Provider  triamcinolone cream (KENALOG) 0.1 % Apply 1 Application topically 2 (two) times daily as needed. 08/19/22  Yes Volney American, PA-C  hydrochlorothiazide (MICROZIDE) 12.5 MG capsule Take 12.5 mg by mouth daily.    [provider]  ondansetron (ZOFRAN-ODT) 4 MG disintegrating tablet Take 1 tablet (4 mg total) by mouth every 8 (eight) hours as needed for nausea or vomiting. 07/08/22   Volney American, PA-C    Family History Family History  Problem Relation Age of Onset   Hypertension Mother    Heart disease Mother    Cancer Father        throat   Pancreatic cancer Father    Congestive Heart Failure Brother    Heart disease Maternal Grandmother    Hypertension Maternal Grandmother    Cancer Maternal Grandfather    Hypertension Maternal Grandfather    Cancer Paternal Grandmother        breast   Breast cancer Maternal Aunt     Social History Social History   Tobacco Use  Smoking status: Never   Smokeless tobacco: Never  Vaping Use   Vaping Use: Never used  Substance Use Topics   Alcohol use: Not Currently   Drug use: No     Allergies   Bee venom, Latex, and Penicillins   Review of Systems Review of Systems Per HPI  Physical Exam Triage Vital Signs ED Triage Vitals  Enc Vitals Group     BP 08/19/22 1411 117/76     Pulse Rate 08/19/22 1411 86     Resp 08/19/22 1411 18     Temp 08/19/22 1411 98.4 F (36.9 C)     Temp Source 08/19/22 1411 Oral     SpO2 08/19/22 1411 98 %     Weight --      Height --      Head Circumference --      Peak Flow --      Pain Score 08/19/22 1414 0     Pain Loc --      Pain Edu? --      Excl. in GC? --    No data found.  Updated Vital Signs BP 117/76 (BP Location: Right Arm)   Pulse 86   Temp 98.4 F (36.9 C) (Oral)   Resp 18   LMP 08/01/2022 (Exact Date)   SpO2 98%   Visual Acuity Right Eye Distance:   Left  Eye Distance:   Bilateral Distance:    Right Eye Near:   Left Eye Near:    Bilateral Near:     Physical Exam Vitals and nursing note reviewed.  Constitutional:      Appearance: Normal appearance. She is not ill-appearing.  HENT:     Head: Atraumatic.  Eyes:     Extraocular Movements: Extraocular movements intact.     Conjunctiva/sclera: Conjunctivae normal.  Cardiovascular:     Rate and Rhythm: Normal rate and regular rhythm.     Heart sounds: Normal heart sounds.  Pulmonary:     Effort: Pulmonary effort is normal.     Breath sounds: Normal breath sounds.  Musculoskeletal:        General: Normal range of motion.     Cervical back: Normal range of motion and neck supple.  Skin:    General: Skin is warm and dry.     Comments: Mildly hyperpigmented regions the shape of EKG leads across abdomen and chest  Neurological:     Mental Status: She is alert and oriented to person, place, and time.  Psychiatric:        Mood and Affect: Mood normal.        Thought Content: Thought content normal.        Judgment: Judgment normal.      UC Treatments / Results  Labs (all labs ordered are listed, but only abnormal results are displayed) Labs Reviewed - No data to display  EKG   Radiology No results found.  Procedures Procedures (including critical care time)  Medications Ordered in UC Medications - No data to display  Initial Impression / Assessment and Plan / UC Course  I have reviewed the triage vital signs and the nursing notes.  Pertinent labs & imaging results that were available during my care of the patient were reviewed by me and considered in my medical decision making (see chart for details).     Suspect inflammatory reaction from the adhesives of the EKG leads.  Treat with triamcinolone cream, monitor for resolution over time.  Dermatology follow-up if not resolving.  Final Clinical Impressions(s) / UC Diagnoses   Final diagnoses:  Discoloration of skin      Discharge Instructions      I suspect the discoloration to be due to some inflammation from your skin possibly reacting to the adhesive of the EKG leads.  Try the triamcinolone cream twice daily as needed to see if this helps decrease the discoloration.  This should resolve over time    ED Prescriptions     Medication Sig Dispense Auth. Provider   triamcinolone cream (KENALOG) 0.1 % Apply 1 Application topically 2 (two) times daily as needed. 60 g Volney American, [place]      PDMP not reviewed this encounter.   Volney American, [place] 08/19/22 1452

## 2022-08-19 NOTE — Discharge Instructions (Signed)
I suspect the discoloration to be due to some inflammation from your skin possibly reacting to the adhesive of the EKG leads.  Try the triamcinolone cream twice daily as needed to see if this helps decrease the discoloration.  This should resolve over time

## 2022-08-25 ENCOUNTER — Ambulatory Visit
Admission: EM | Admit: 2022-08-25 | Discharge: 2022-08-25 | Disposition: A | Discharge status: Home / Self Care | Payer: BC Managed Care – PPO | Attending: Family Medicine | Admitting: Family Medicine

## 2022-08-25 ENCOUNTER — Ambulatory Visit: Payer: BC Managed Care – PPO

## 2022-08-25 DIAGNOSIS — Z113 Encounter for screening for infections with a predominantly sexual mode of transmission: Secondary | ICD-10-CM | POA: Diagnosis present

## 2022-08-25 DIAGNOSIS — N760 Acute vaginitis: Secondary | ICD-10-CM | POA: Insufficient documentation

## 2022-08-25 NOTE — ED Triage Notes (Signed)
Pt reports yellow vaginal discharge and vaginal itching x 1 week.

## 2022-08-25 NOTE — ED Provider Notes (Signed)
[facility]    CSN: [id number] Arrival date & time: 08/25/22  0935      History   Chief Complaint Chief Complaint  Patient presents with   Appointment    1000   Vaginal Discharge         HPI Rachel Stevens is a 41 y.o. female.   Patient presenting today with 1 week history of vaginal discharge, vaginal itching.  She denies urinary symptoms, vaginal bleeding, pelvic or abdominal pain, fevers, nausea, vomiting.  History of recurrent BV, though most recent vaginal swab was negative for all.  Using probiotics, unscented soaps without any relief.  Also requesting STD testing, including a throat swab.     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   Hypertension    Irregular periods 01/31/2015   Meniere disease    Missed period 11/01/2014   Patient desires pregnancy 11/01/2014   Pelvic floor tension    Vaginal discharge 11/01/2014   Vulvar pain    Weight gain 11/01/2014    Patient Active Problem List   Diagnosis Date Noted   Anxiety 08/19/2020   Breast mass in female 03/04/2020   Elevated blood pressure reading 02/21/2020   Obesity BMI=30.9  160 lbs 07/05/2019   Pelvic "burning" x3 years 07/05/2019   History of reduction surgery of right breast 2009 07/05/2019   Depression 07/05/2015   Dizziness of unknown cause 07/05/2015   Fatigue 07/05/2015   Vitamin B12 deficiency 07/05/2015   Vitamin D insufficiency 07/05/2015   Vulvar pain 07/05/2015   Irregular periods 01/31/2015   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 Authorizing Provider  Multiple Vitamins-Minerals (MULTIVITAMIN WITH MINERALS) tablet Take 1 tablet by mouth daily.   Yes [provider]  hydrochlorothiazide (MICROZIDE) 12.5 MG capsule Take 12.5 mg by mouth daily.    [provider]  ondansetron (ZOFRAN-ODT) 4 MG disintegrating tablet Take 1 tablet (4 mg total) by mouth every 8 (eight) hours as needed for nausea or vomiting. 07/08/22   Particia Nearing, PA-C  triamcinolone cream (KENALOG) 0.1 % Apply 1 Application topically 2 (two) times daily as needed. 08/19/22   Particia Nearing, PA-C    Family History Family History  Problem Relation Age of Onset   Hypertension Mother    Heart disease Mother    Cancer Father        throat   Pancreatic cancer Father    Congestive Heart Failure Brother    Heart disease Maternal Grandmother    Hypertension Maternal Grandmother    Cancer Maternal Grandfather    Hypertension Maternal Grandfather    Cancer Paternal Grandmother  breast   Breast cancer Maternal Aunt     Social History Social History   Tobacco Use   Smoking status: Never   Smokeless tobacco: Never  Vaping Use   Vaping Use: Never used  Substance Use Topics   Alcohol use: Not Currently   Drug use: No     Allergies   Bee venom, Latex, and Penicillins   Review of Systems Review of Systems Per HPI  Physical Exam Triage Vital Signs ED Triage Vitals  Enc Vitals Group     BP 08/25/22 1019 129/78     Pulse Rate 08/25/22 1019 72     Resp 08/25/22 1019 16     Temp 08/25/22 1019 99.2 F (37.3 C)     Temp Source 08/25/22 1019 Oral     SpO2 08/25/22 1019 97 %     Weight --      Height --      Head Circumference --      Peak Flow --      Pain Score 08/25/22 1017 0     Pain Loc --      Pain Edu? --      Excl. in GC? --    No data found.  Updated Vital Signs BP 129/78  (BP Location: Right Arm)   Pulse 72   Temp 99.2 F (37.3 C) (Oral)   Resp 16   LMP 08/04/2022 (Exact Date)   SpO2 97%   Visual Acuity Right Eye Distance:   Left Eye Distance:   Bilateral Distance:    Right Eye Near:   Left Eye Near:    Bilateral Near:     Physical Exam Vitals and nursing note reviewed.  Constitutional:      Appearance: Normal appearance.  HENT:     Head: Atraumatic.     Right Ear: Tympanic membrane and external ear normal.     Left Ear: Tympanic membrane and external ear normal.     Mouth/Throat:     Mouth: Mucous membranes are moist.  Eyes:     Extraocular Movements: Extraocular movements intact.     Conjunctiva/sclera: Conjunctivae normal.  Cardiovascular:     Rate and Rhythm: Normal rate and regular rhythm.     Heart sounds: Normal heart sounds.  Pulmonary:     Effort: Pulmonary effort is normal.     Breath sounds: Normal breath sounds.  Abdominal:     General: Bowel sounds are normal. There is no distension.     Palpations: Abdomen is soft.     Tenderness: There is no abdominal tenderness. There is no right CVA tenderness, left CVA tenderness or guarding.  Genitourinary:    Comments: GU exam deferred, self swab performed Musculoskeletal:        General: Normal range of motion.     Cervical back: Normal range of motion and neck supple.  Skin:    General: Skin is warm and dry.  Neurological:     Mental Status: She is alert and oriented to person, place, and time.  Psychiatric:        Mood and Affect: Mood normal.        Thought Content: Thought content normal.      UC Treatments / Results  Labs (all labs ordered are listed, but only abnormal results are displayed) Labs Reviewed  CYTOLOGY, (ORAL, ANAL, URETHRAL) ANCILLARY ONLY  CERVICOVAGINAL ANCILLARY ONLY    EKG   Radiology No results found.  Procedures Procedures (including critical care time)  Medications Ordered  in UC Medications - No data to display  Initial  Impression / Assessment and Plan / UC Course  I have reviewed the triage vital signs and the nursing notes.  Pertinent labs & imaging results that were available during my care of the patient were reviewed by me and considered in my medical decision making (see chart for details).     Vitals and exam reassuring today, vaginal swab and throat swab pending.  Will treat based on these results.  Discussed supportive over-the-counter remedies additionally.  Final Clinical Impressions(s) / UC Diagnoses   Final diagnoses:  Routine screening for STI (sexually transmitted infection)  Acute vaginitis   Discharge Instructions   None    ED Prescriptions   None    PDMP not reviewed this encounter.   Particia Nearing, [place] 08/25/22 1233

## 2022-08-26 LAB — CYTOLOGY, (ORAL, ANAL, URETHRAL) ANCILLARY ONLY
Chlamydia: NEGATIVE
Comment: NEGATIVE
Comment: NEGATIVE
Comment: NORMAL
Neisseria Gonorrhea: NEGATIVE
Trichomonas: NEGATIVE

## 2022-08-26 LAB — CERVICOVAGINAL ANCILLARY ONLY
Bacterial Vaginitis (gardnerella): POSITIVE — AB
Candida Glabrata: NEGATIVE
Candida Vaginitis: NEGATIVE
Chlamydia: NEGATIVE
Comment: NEGATIVE
Comment: NEGATIVE
Comment: NEGATIVE
Comment: NEGATIVE
Comment: NEGATIVE
Comment: NORMAL
Neisseria Gonorrhea: NEGATIVE
Trichomonas: NEGATIVE

## 2022-08-27 ENCOUNTER — Telehealth (HOSPITAL_COMMUNITY): Payer: Self-pay | Admitting: Emergency Medicine

## 2022-08-27 MED ORDER — METRONIDAZOLE 500 MG PO TABS: 500.0000 mg | ORAL_TABLET | Freq: Two times a day (BID) | ORAL | 0 refills | Status: DC

## 2022-08-28 ENCOUNTER — Telehealth (HOSPITAL_COMMUNITY): Payer: Self-pay | Admitting: Emergency Medicine

## 2022-08-28 MED ORDER — METRONIDAZOLE 500 MG PO TABS: 500.0000 mg | ORAL_TABLET | Freq: Two times a day (BID) | ORAL | 0 refills | Status: DC

## 2022-08-30 ENCOUNTER — Telehealth: Payer: Self-pay

## 2022-08-30 ENCOUNTER — Ambulatory Visit: Payer: BC Managed Care – PPO

## 2022-08-30 MED ORDER — METRONIDAZOLE 0.75 % VA GEL: 1.0000 | Freq: Two times a day (BID) | VAGINAL | 0 refills | Status: DC

## 2022-08-30 NOTE — Telephone Encounter (Signed)
Metronidazole causing side effects so MetroGel sent to pharmacy.

## 2022-09-12 ENCOUNTER — Ambulatory Visit: Admit: 2022-09-12 | Discharge status: Absent without leave | Payer: BC Managed Care – PPO

## 2022-09-18 ENCOUNTER — Ambulatory Visit
Admission: EM | Admit: 2022-09-18 | Discharge: 2022-09-18 | Disposition: A | Discharge status: Home / Self Care | Payer: BC Managed Care – PPO | Attending: Nurse Practitioner | Admitting: Nurse Practitioner

## 2022-09-18 ENCOUNTER — Ambulatory Visit: Payer: BC Managed Care – PPO

## 2022-09-18 DIAGNOSIS — H60391 Other infective otitis externa, right ear: Secondary | ICD-10-CM | POA: Diagnosis not present

## 2022-09-18 DIAGNOSIS — N760 Acute vaginitis: Secondary | ICD-10-CM | POA: Diagnosis not present

## 2022-09-18 DIAGNOSIS — B9689 Other specified bacterial agents as the cause of diseases classified elsewhere: Secondary | ICD-10-CM

## 2022-09-18 DIAGNOSIS — Z113 Encounter for screening for infections with a predominantly sexual mode of transmission: Secondary | ICD-10-CM | POA: Diagnosis present

## 2022-09-18 LAB — POCT URINALYSIS DIP (MANUAL ENTRY)
Bilirubin, UA: NEGATIVE
Blood, UA: NEGATIVE
Glucose, UA: NEGATIVE mg/dL
Ketones, POC UA: NEGATIVE mg/dL
Leukocytes, UA: NEGATIVE
Nitrite, UA: NEGATIVE
Spec Grav, UA: 1.02 (ref 1.010–1.025)
Urobilinogen, UA: 0.2 E.U./dL
pH, UA: 7 (ref 5.0–8.0)

## 2022-09-18 LAB — POCT URINE PREGNANCY: Preg Test, Ur: NEGATIVE

## 2022-09-18 MED ORDER — METRONIDAZOLE 0.75 % VA GEL: 1.0000 | Freq: Every day | VAGINAL | 0 refills | Status: AC

## 2022-09-18 MED ORDER — METRONIDAZOLE 0.75 % VA GEL: 1.0000 | Freq: Every day | VAGINAL | 0 refills | Status: DC

## 2022-09-18 NOTE — ED Provider Notes (Signed)
[facility]    CSN: [id number] Arrival date & time: 09/18/22  0848      History   Chief Complaint Chief Complaint  Patient presents with   Abdominal Pain   Vaginal Discharge    HPI Rachel Stevens is a 41 y.o. female.   The history is provided by the patient.   Patient presents for complaints of vaginal discharge and pelvic pain that is been present for the past week.  Patient states discharge is white.  Patient reports that she was seen several weeks ago, and tested positive for BV.  She states that she was prescribed Flagyl, but she stopped the medication after 2 doses because it made her feel sick.  She continues to experience discharge, odor, but denies vaginal itching, or urinary symptoms.  Patient is also requesting STI testing as she reports that she engages in oral sex.  Her last menstrual cycle was on 08/19/2022.  Patient also complains of an insect bite to the right ear.  Patient states that the area was more swollen, but that she did see her primary care physician who "popped" the area to the right earlobe.  Since that time, she states that the area has decreased in size, and is starting to look better.   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   Hypertension    Irregular periods 01/31/2015   Meniere disease    Missed period 11/01/2014   Patient desires pregnancy 11/01/2014   Pelvic floor tension    Vaginal discharge 11/01/2014   Vulvar pain    Weight gain 11/01/2014    Patient Active Problem List   Diagnosis Date Noted   Anxiety 08/19/2020   Breast mass in female 03/04/2020   Elevated blood pressure reading 02/21/2020   Obesity BMI=30.9  160 lbs 07/05/2019   Pelvic "burning" x3 years 07/05/2019   History of reduction surgery of right breast 2009 07/05/2019   Depression 07/05/2015   Dizziness of unknown cause 07/05/2015   Fatigue  07/05/2015   Vitamin B12 deficiency 07/05/2015   Vitamin D insufficiency 07/05/2015   Vulvar pain 07/05/2015   Irregular periods 01/31/2015   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 Authorizing Provider  metroNIDAZOLE (FLAGYL) 500 MG tablet Take 1 tablet (500 mg total) by mouth 2 (two) times daily. 08/28/22   Lamptey, Britta Mccreedy, MD  metroNIDAZOLE (METROGEL) 0.75 % vaginal gel Place 1 Applicatorful vaginally at bedtime for 7 days. 09/18/22 09/25/22 Yes Derrian Poli-Warren, Sadie Haber, NP  hydrochlorothiazide (MICROZIDE) 12.5 MG capsule Take 12.5 mg by mouth daily.    [provider]  Multiple Vitamins-Minerals (MULTIVITAMIN WITH MINERALS) tablet Take  1 tablet by mouth daily.    [provider]  ondansetron (ZOFRAN-ODT) 4 MG disintegrating tablet Take 1 tablet (4 mg total) by mouth every 8 (eight) hours as needed for nausea or vomiting. 07/08/22   Particia Nearing, PA-C  triamcinolone cream (KENALOG) 0.1 % Apply 1 Application topically 2 (two) times daily as needed. 08/19/22   Particia Nearing, PA-C    Family History Family History  Problem Relation Age of Onset   Hypertension Mother    Heart disease Mother    Cancer Father        throat   Pancreatic cancer Father    Congestive Heart Failure Brother    Heart disease Maternal Grandmother    Hypertension Maternal Grandmother    Cancer Maternal Grandfather    Hypertension Maternal Grandfather    Cancer Paternal Grandmother        breast   Breast cancer Maternal Aunt      Social History Social History   Tobacco Use   Smoking status: Never   Smokeless tobacco: Never  Vaping Use   Vaping Use: Never used  Substance Use Topics   Alcohol use: Not Currently   Drug use: No     Allergies   Bee venom, Latex, and Penicillins   Review of Systems Review of Systems Per HPI  Physical Exam Triage Vital Signs ED Triage Vitals  Enc Vitals Group     BP 09/18/22 1054 126/84     Pulse Rate 09/18/22 1054 69     Resp 09/18/22 1054 16     Temp 09/18/22 1054 98.6 F (37 C)     Temp Source 09/18/22 1054 Oral     SpO2 09/18/22 1054 98 %     Weight --      Height --      Head Circumference --      Peak Flow --      Pain Score 09/18/22 1052 8     Pain Loc --      Pain Edu? --      Excl. in GC? --    No data found.  Updated Vital Signs BP 126/84 (BP Location: Right Arm)   Pulse 69   Temp 98.6 F (37 C) (Oral)   Resp 16   LMP 08/19/2022 (Exact Date)   SpO2 98%   Visual Acuity Right Eye Distance:   Left Eye Distance:   Bilateral Distance:    Right Eye Near:   Left Eye Near:    Bilateral Near:     Physical Exam Vitals and nursing note reviewed.  Constitutional:      General: She is not in acute distress.    Appearance: She is well-developed.  HENT:     Head: Normocephalic.  Eyes:     Extraocular Movements: Extraocular movements intact.     Pupils: Pupils are equal, round, and reactive to light.  Cardiovascular:     Rate and Rhythm: Normal rate and regular rhythm.     Heart sounds: Normal heart sounds.  Pulmonary:     Effort: Pulmonary effort is normal. No respiratory distress.     Breath sounds: Normal breath sounds. No stridor. No wheezing, rhonchi or rales.  Abdominal:     Palpations: Abdomen is soft.     Tenderness: There is no abdominal tenderness.  Skin:    General: Skin is warm and dry.     Findings: Wound present.     Comments: Healing wound located to the right posterior earlobe.  There is no swelling, erythema,  warmth, oozing, or drainage present.  Neurological:     General: No focal deficit present.     Mental Status: She is alert and oriented to person, place, and time.  Psychiatric:        Mood and Affect: Mood normal.        Behavior: Behavior normal.      UC Treatments / Results  Labs (all labs ordered are listed, but only abnormal results are displayed) Labs Reviewed  POCT URINALYSIS DIP (MANUAL ENTRY) - Abnormal; Notable for the following components:      Result Value   Color, UA red (*)    Protein Ur, POC trace (*)    All other components within normal limits  HIV ANTIBODY (ROUTINE TESTING W REFLEX)  RPR  POCT URINE PREGNANCY  CERVICOVAGINAL ANCILLARY ONLY  CYTOLOGY, (ORAL, ANAL, URETHRAL) ANCILLARY ONLY    EKG   Radiology No results found.  Procedures Procedures (including critical care time)  Medications Ordered in UC Medications - No data to display  Initial Impression / Assessment and Plan / UC Course  I have reviewed the triage vital signs and the nursing notes.  Pertinent labs & imaging results that were available during my care of the patient were reviewed by me and considered in my medical decision making (see chart for details).  The patient is well-appearing, she is in no acute distress, vital signs are stable.  Given the patient's recent diagnosis of BV that did not undergo complete treatment, will prescribe metronidazole gel at this time.  Cytology swabs are pending including oral and vaginal, along with HIV and syphilis testing.  Patient was advised she will be contacted if the pending test results are positive.  With regard to her ear, the area to the right posterior earlobe is healing, there are no signs of infection present.  Patient was advised to use over-the-counter Neosporin to the affected area until symptoms resolved.  Patient verbalizes understanding.  All questions were answered.  Patient stable for discharge.  Final Clinical Impressions(s) /  UC Diagnoses   Final diagnoses:  Screening examination for sexually transmitted disease  Bacterial vaginosis  Infection of skin of ear lobe, right     Discharge Instructions      Urinalysis does not show that you have a urinary tract infection.  A urine culture is pending.  You will be contacted if the pending test results are positive. Vaginal and oral cytology swabs have been collected.  Results should be available within the next 48 to 72 hours.  If the results are positive, you will be contacted to provide treatment. Use medication as prescribed. Increase condom use with each sexual encounter.  For your ear, recommend over-the-counter Neosporin at least twice daily while symptoms persist. Do not rub, pick, or disrupt the skin that is healing on the earlobe. Take over-the-counter Tylenol or ibuprofen as needed for pain, fever, or general discomfort. May apply ice to the right ear to help with pain or swelling as needed.  If symptoms fail to improve after this treatment, please follow-up with your primary care physician for further evaluation. Follow-up as needed.     ED Prescriptions     Medication Sig Dispense Auth. Provider   metroNIDAZOLE (METROGEL) 0.75 % vaginal gel  (Status: Discontinued) Place 1 Applicatorful vaginally at bedtime for 7 days. 70 g Obadiah Dennard-Warren, Sadie Haber, NP   metroNIDAZOLE (METROGEL) 0.75 % vaginal gel Place 1 Applicatorful vaginally at bedtime for 7 days.  70 g Aradhana Gin-Warren, Sadie Haberhristie J, NP      PDMP not reviewed this encounter.   Abran CantorLeath-Warren, Harlin Mazzoni J, NP 09/18/22 1139

## 2022-09-18 NOTE — Discharge Instructions (Addendum)
Urinalysis does not show that you have a urinary tract infection.  A urine culture is pending.  You will be contacted if the pending test results are positive. Vaginal and oral cytology swabs have been collected.  Results should be available within the next 48 to 72 hours.  If the results are positive, you will be contacted to provide treatment. Use medication as prescribed. Increase condom use with each sexual encounter.  For your ear, recommend over-the-counter Neosporin at least twice daily while symptoms persist. Do not rub, pick, or disrupt the skin that is healing on the earlobe. Take over-the-counter Tylenol or ibuprofen as needed for pain, fever, or general discomfort. May apply ice to the right ear to help with pain or swelling as needed.  If symptoms fail to improve after this treatment, please follow-up with your primary care physician for further evaluation. Follow-up as needed.

## 2022-09-18 NOTE — ED Triage Notes (Signed)
Pt reports lower abdominal pain and white vagina discharge x 1 week.  Pt did not finished Flagyl makes her feel sick.   Pt think she has an insect bite in the right ear x 5 days.

## 2022-09-19 LAB — RPR: RPR Ser Ql: NONREACTIVE

## 2022-09-19 LAB — HIV ANTIBODY (ROUTINE TESTING W REFLEX): HIV Screen 4th Generation wRfx: NONREACTIVE

## 2022-09-22 LAB — CERVICOVAGINAL ANCILLARY ONLY
Bacterial Vaginitis (gardnerella): NEGATIVE
Candida Glabrata: NEGATIVE
Candida Vaginitis: NEGATIVE
Chlamydia: NEGATIVE
Comment: NEGATIVE
Comment: NEGATIVE
Comment: NEGATIVE
Comment: NEGATIVE
Comment: NEGATIVE
Comment: NORMAL
Neisseria Gonorrhea: NEGATIVE
Trichomonas: NEGATIVE

## 2022-09-22 LAB — CYTOLOGY, (ORAL, ANAL, URETHRAL) ANCILLARY ONLY
Chlamydia: NEGATIVE
Comment: NEGATIVE
Comment: NEGATIVE
Comment: NORMAL
Neisseria Gonorrhea: NEGATIVE
Trichomonas: NEGATIVE

## 2022-09-29 ENCOUNTER — Encounter (HOSPITAL_COMMUNITY): Payer: Self-pay | Admitting: Emergency Medicine

## 2022-09-29 ENCOUNTER — Other Ambulatory Visit: Payer: Self-pay

## 2022-09-29 ENCOUNTER — Emergency Department (HOSPITAL_COMMUNITY)
Admission: EM | Admit: 2022-09-29 | Discharge: 2022-09-29 | Disposition: A | Discharge status: Home / Self Care | Payer: BC Managed Care – PPO | Attending: Emergency Medicine | Admitting: Emergency Medicine

## 2022-09-29 DIAGNOSIS — M5489 Other dorsalgia: Secondary | ICD-10-CM

## 2022-09-29 DIAGNOSIS — I10 Essential (primary) hypertension: Secondary | ICD-10-CM | POA: Diagnosis not present

## 2022-09-29 DIAGNOSIS — M546 Pain in thoracic spine: Secondary | ICD-10-CM | POA: Diagnosis not present

## 2022-09-29 DIAGNOSIS — Z91040 Latex allergy status: Secondary | ICD-10-CM | POA: Diagnosis not present

## 2022-09-29 DIAGNOSIS — K5900 Constipation, unspecified: Secondary | ICD-10-CM | POA: Insufficient documentation

## 2022-09-29 DIAGNOSIS — M549 Dorsalgia, unspecified: Secondary | ICD-10-CM | POA: Diagnosis present

## 2022-09-29 HISTORY — DX: Other chronic pain: G89.29

## 2022-09-29 LAB — URINALYSIS, ROUTINE W REFLEX MICROSCOPIC
Bilirubin Urine: NEGATIVE
Glucose, UA: NEGATIVE mg/dL
Ketones, ur: NEGATIVE mg/dL
Leukocytes,Ua: NEGATIVE
Nitrite: NEGATIVE
Protein, ur: NEGATIVE mg/dL
Specific Gravity, Urine: 1.006 (ref 1.005–1.030)
pH: 6 (ref 5.0–8.0)

## 2022-09-29 LAB — COMPREHENSIVE METABOLIC PANEL
ALT: 16 U/L (ref 0–44)
AST: 17 U/L (ref 15–41)
Albumin: 3.9 g/dL (ref 3.5–5.0)
Alkaline Phosphatase: 72 U/L (ref 38–126)
Anion gap: 6 (ref 5–15)
BUN: 8 mg/dL (ref 6–20)
CO2: 26 mmol/L (ref 22–32)
Calcium: 8.8 mg/dL — ABNORMAL LOW (ref 8.9–10.3)
Chloride: 105 mmol/L (ref 98–111)
Creatinine, Ser: 0.64 mg/dL (ref 0.44–1.00)
GFR, Estimated: >60 mL/min (ref >60)
Glucose, Bld: 88 mg/dL (ref 70–99)
Potassium: 3.9 mmol/L (ref 3.5–5.1)
Sodium: 137 mmol/L (ref 135–145)
Total Bilirubin: 0.7 mg/dL (ref 0.3–1.2)
Total Protein: 7.3 g/dL (ref 6.5–8.1)

## 2022-09-29 LAB — CBC WITH DIFFERENTIAL/PLATELET
Abs Immature Granulocytes: 0.04 10*3/uL (ref 0.00–0.07)
Basophils Absolute: 0 10*3/uL (ref 0.0–0.1)
Basophils Relative: 0 %
Eosinophils Absolute: 0.1 10*3/uL (ref 0.0–0.5)
Eosinophils Relative: 1 %
HCT: 37.5 % (ref 36.0–46.0)
Hemoglobin: 11.9 g/dL — ABNORMAL LOW (ref 12.0–15.0)
Immature Granulocytes: 0 %
Lymphocytes Relative: 24 %
Lymphs Abs: 2.5 10*3/uL (ref 0.7–4.0)
MCH: 28 pg (ref 26.0–34.0)
MCHC: 31.7 g/dL (ref 30.0–36.0)
MCV: 88.2 fL (ref 80.0–100.0)
Monocytes Absolute: 0.6 10*3/uL (ref 0.1–1.0)
Monocytes Relative: 6 %
Neutro Abs: 7.2 10*3/uL (ref 1.7–7.7)
Neutrophils Relative %: 69 %
Platelets: 261 10*3/uL (ref 150–400)
RBC: 4.25 MIL/uL (ref 3.87–5.11)
RDW: 13.9 % (ref 11.5–15.5)
WBC: 10.5 10*3/uL (ref 4.0–10.5)
nRBC: 0 % (ref 0.0–0.2)

## 2022-09-29 LAB — PREGNANCY, URINE: Preg Test, Ur: NEGATIVE

## 2022-09-29 LAB — LIPASE, BLOOD: Lipase: 28 U/L (ref 11–51)

## 2022-09-29 MED ORDER — BISACODYL 10 MG RE SUPP
10.0000 mg | Freq: Once | RECTAL | Status: AC
  Administered 2022-09-29: 10 mg via RECTAL
  Filled 2022-09-29: qty 1

## 2022-09-29 MED ORDER — METHOCARBAMOL 500 MG PO TABS: 500.0000 mg | ORAL_TABLET | Freq: Three times a day (TID) | ORAL | 0 refills | Status: DC

## 2022-09-29 MED ORDER — OXYCODONE-ACETAMINOPHEN 5-325 MG PO TABS
1.0000 | ORAL_TABLET | Freq: Once | ORAL | Status: AC
  Administered 2022-09-29: 1 via ORAL
  Filled 2022-09-29: qty 1

## 2022-09-29 NOTE — ED Triage Notes (Signed)
Pt c/o pain along her entire spine, reports she had a steroid injection on Friday, pt further endorses nausea that she feels may be related to back pain

## 2022-09-29 NOTE — ED Provider Notes (Signed)
[facility] EMERGENCY DEPARTMENT Provider Note   CSN: [id number] Arrival date & time: 09/29/22  5643     History {Add pertinent medical, surgical, social history, OB history to HPI:1} Chief Complaint  Patient presents with   Back Pain    Rachel Stevens is a 42 y.o. female.   Back Pain Associated symptoms: abdominal pain and numbness ("pins and needles" sensation left foot)   Associated symptoms: no dysuria, no fever, no pelvic pain and no weakness        Rachel Stevens is a 42 y.o. female past medical history of hypertension, ongoing back pain who presents to the Emergency Department complaining of persisting pain along her entire spine from her neck down to her gluteal region.  She has been seen by neurology in [facility] for this, underwent spinal epidural injection 09/25/2022 without relief.  No worsening symptoms since the injection.  Endorses nausea and left abdominal pain and left leg pain as well.  Possible constipation as she states she has not had a bowel movement in several days.  Nausea without vomiting.  She is having pins and needle sensation to her left foot.  Denies any weakness or numbness of her leg  No urine changes, fever or chills.  Had MRI of cervical spine on 09/16/2022 and MRI of lumbar spine 09/05/2022 diagnosed with lumbar radiculopathy with herniated lumbar disc without myelopathy.  MRI brain 02/16/22 that was normal  Home Medications Prior to Admission medications   Medication Sig Start Date End Date Taking? Authorizing Provider  metroNIDAZOLE (FLAGYL) 500 MG tablet Take 1 tablet (500 mg total) by mouth 2 (two) times daily. 08/28/22   Lamptey, Myrene Galas, MD  hydrochlorothiazide (MICROZIDE) 12.5 MG capsule Take 12.5 mg by mouth daily.    [provider]  Multiple Vitamins-Minerals (MULTIVITAMIN WITH MINERALS) tablet Take 1 tablet by mouth daily.    [provider]  ondansetron (ZOFRAN-ODT) 4 MG disintegrating tablet Take 1 tablet (4 mg total) by  mouth every 8 (eight) hours as needed for nausea or vomiting. 07/08/22   Volney American, PA-C  triamcinolone cream (KENALOG) 0.1 % Apply 1 Application topically 2 (two) times daily as needed. 08/19/22   Volney American, PA-C      Allergies    Bee venom, Latex, and Penicillins    Review of Systems   Review of Systems  Constitutional:  Negative for chills and fever.  Respiratory:  Negative for cough, chest tightness and shortness of breath.   Gastrointestinal:  Positive for abdominal pain and constipation. Negative for nausea and vomiting.  Genitourinary:  Negative for difficulty urinating, dysuria, flank pain, pelvic pain, vaginal bleeding and vaginal discharge.  Musculoskeletal:  Positive for back pain and neck pain. Negative for joint swelling and neck stiffness.  Skin:  Negative for rash.  Neurological:  Positive for numbness ("pins and needles" sensation left foot). Negative for weakness.    Physical Exam Updated Vital Signs BP 122/83 (BP Location: Right Arm)   Pulse 82   Temp 98.7 F (37.1 C) (Oral)   Resp 17   Ht 5\' 6"  (1.676 m)   Wt 86.2 kg   LMP 09/25/2022 (Exact Date)   SpO2 100%   BMI 30.67 kg/m  Physical Exam Vitals and nursing note reviewed.  Constitutional:      General: She is not in acute distress.    Appearance: Normal appearance. She is not ill-appearing or toxic-appearing.  HENT:     Mouth/Throat:     Mouth:  Mucous membranes are moist.  Cardiovascular:     Rate and Rhythm: Normal rate and regular rhythm.     Pulses: Normal pulses.  Pulmonary:     Effort: Pulmonary effort is normal. No respiratory distress.  Chest:     Chest wall: No tenderness.  Abdominal:     Palpations: Abdomen is soft.     Tenderness: There is abdominal tenderness. There is no right CVA tenderness or left CVA tenderness.  Musculoskeletal:        General: Normal range of motion.  Skin:    General: Skin is warm.     Capillary Refill: Capillary refill takes less  than 2 seconds.     Findings: No rash.  Neurological:     General: No focal deficit present.     Mental Status: She is alert.     Sensory: No sensory deficit.     Motor: No weakness.     ED Results / Procedures / Treatments   Labs (all labs ordered are listed, but only abnormal results are displayed) Labs Reviewed - No data to display  EKG None  Radiology No results found.  Procedures Procedures  {Document cardiac monitor, telemetry assessment procedure when appropriate:1}  Medications Ordered in ED Medications - No data to display  ED Course/ Medical Decision Making/ A&P                           Medical Decision Making    {Document critical care time when appropriate:1} {Document review of labs and clinical decision tools ie heart score, Chads2Vasc2 etc:1}  {Document your independent review of radiology images, and any outside records:1} {Document your discussion with family members, caretakers, and with consultants:1} {Document social determinants of health affecting pt's care:1} {Document your decision making why or why not admission, treatments were needed:1} Final Clinical Impression(s) / ED Diagnoses Final diagnoses:  None    Rx / DC Orders ED Discharge Orders     None

## 2022-09-29 NOTE — Discharge Instructions (Signed)
Your ongoing back pain may be aggravated by constipation.  I recommend that you take an over-the-counter laxative such as MiraLAX, 1 heaping capful mixed in 8 to 10 ounces of water or juice 1-2 times per day until you have significant relief of your constipation.  Please follow-up with your primary care provider for recheck or return to the emergency department for any new or worsening symptoms.

## 2022-09-30 ENCOUNTER — Emergency Department
Admission: EM | Admit: 2022-09-30 | Discharge: 2022-10-01 | Disposition: A | Discharge status: Home / Self Care | Payer: BC Managed Care – PPO | Attending: Emergency Medicine | Admitting: Emergency Medicine

## 2022-09-30 DIAGNOSIS — M545 Low back pain, unspecified: Secondary | ICD-10-CM | POA: Diagnosis present

## 2022-09-30 DIAGNOSIS — M5442 Lumbago with sciatica, left side: Secondary | ICD-10-CM | POA: Diagnosis not present

## 2022-09-30 DIAGNOSIS — M5432 Sciatica, left side: Secondary | ICD-10-CM

## 2022-09-30 MED ORDER — LIDOCAINE 5 % EX PTCH
1.0000 | MEDICATED_PATCH | CUTANEOUS | Status: DC
  Administered 2022-09-30: 1 via TRANSDERMAL
  Filled 2022-09-30: qty 1

## 2022-09-30 MED ORDER — LIDOCAINE 5 % EX PTCH: 1.0000 | MEDICATED_PATCH | Freq: Two times a day (BID) | CUTANEOUS | 0 refills | Status: AC | PRN

## 2022-09-30 MED ORDER — GABAPENTIN 300 MG PO CAPS: 300.0000 mg | ORAL_CAPSULE | Freq: Three times a day (TID) | ORAL | 1 refills | Status: DC

## 2022-09-30 MED ORDER — CYCLOBENZAPRINE HCL 5 MG PO TABS: 5.0000 mg | ORAL_TABLET | Freq: Three times a day (TID) | ORAL | 0 refills | Status: DC | PRN

## 2022-09-30 NOTE — Discharge Instructions (Addendum)
Take the prescription meds as directed.  Use either the previously prescribed methocarbamol or the newly prescribed cyclobenzaprine for muscle spasm relief.  Follow-up with your neurologist for ongoing care including medication management.

## 2022-09-30 NOTE — ED Triage Notes (Signed)
Pt sts that she has been having back pain for the last week or two. Pt now has left leg pain with swelling also.

## 2022-10-01 NOTE — ED Provider Notes (Signed)
[facility] Emergency Department Provider Note     Event Date/Time   First MD Initiated Contact with Patient 09/30/22 2221     (approximate)   History   Back Pain   HPI  Rachel Stevens is a 42 y.o. female with a history of chronic low back pain as well as left lower extremity sciatica, presents to the ED for acute on chronic symptoms.  Patient denies any recent injury, trauma, or fall.  Patient concern for left lower extremity swelling.  Patient is approximately 1 week status post a bilateral epidural steroid injection.  She denies any benefit from the procedure.  She denies any chest pain, shortness of breath, bladder or bowel incontinence, foot drop, or saddle anesthesia.     Physical Exam   Triage Vital Signs: ED Triage Vitals  Enc Vitals Group     BP 09/30/22 1903 (!) 139/93     Pulse Rate 09/30/22 1903 70     Resp 09/30/22 1903 18     Temp 09/30/22 1903 98.7 F (37.1 C)     Temp Source 09/30/22 1903 Oral     SpO2 09/30/22 1903 98 %     Weight 09/30/22 1905 190 lb (86.2 kg)     Height --      Head Circumference --      Peak Flow --      Pain Score 09/30/22 1905 9     Pain Loc --      Pain Edu? --      Excl. in [place]? --     Most recent vital signs: Vitals:   09/30/22 1903 10/01/22 0005  BP: (!) 139/93 (!) 129/93  Pulse: 70 80  Resp: 18 17  Temp: 98.7 F (37.1 C) 98 F (36.7 C)  SpO2: 98% 100%    General Awake, no distress.  CV:  Good peripheral perfusion.  RESP:  Normal effort.  ABD:  No distention.  MSK:  Normal spinal alignment without midline tenderness, spasm, deformity, or step-off.  Patient transitions from supine to sit.  Normal flexion and extension range of the hips and knees bilaterally. NEURO: Cranial nerves II to XII grossly intact.  Normal LE DTRs bilaterally.  Normal dorsiflexion for emergent exam.  Negative supine straight leg raise bilaterally.   ED Results / Procedures / Treatments   Labs (all labs ordered  are listed, but only abnormal results are displayed) Labs Reviewed - No data to display   EKG   RADIOLOGY   No results found.   PROCEDURES:  Critical Care performed: No  Procedures   MEDICATIONS ORDERED IN ED: Medications  lidocaine (LIDODERM) 5 % 1 patch (1 patch Transdermal Patch Applied 09/30/22 2351)     IMPRESSION / MDM / ASSESSMENT AND PLAN / ED COURSE  I reviewed the triage vital signs and the nursing notes.                              Differential diagnosis includes, but is not limited to, radiculopathy, sciatica, lumbar strain  Patient's presentation is most consistent with acute, uncomplicated illness.  Patient's diagnosis is consistent with sciatica.  Reassuring exam overall.  No red flags noted.  Patient with no acute neuromuscular deficits.  Patient will be discharged home with prescriptions for patches and cyclobenzaprine as well as gabapentin. Patient is to follow up with her neurologist as needed or otherwise directed. Patient is given ED precautions to  return to the ED for any worsening or new symptoms.     FINAL CLINICAL IMPRESSION(S) / ED DIAGNOSES   Final diagnoses:  Sciatica of left side     Rx / DC Orders   ED Discharge Orders          Ordered    lidocaine (LIDODERM) 5 %  Every 12 hours PRN        09/30/22 2242    gabapentin (NEURONTIN) 300 MG capsule  3 times daily        09/30/22 2242    cyclobenzaprine (FLEXERIL) 5 MG tablet  3 times daily PRN        09/30/22 2242             Note:  This document was prepared using Dragon voice recognition software and may include unintentional dictation errors.    Melvenia Needles, PA-C 10/01/22 0033    Harvest Dark, MD 10/01/22 2255

## 2022-10-24 ENCOUNTER — Ambulatory Visit
Admission: RE | Admit: 2022-10-24 | Discharge: 2022-10-24 | Disposition: A | Discharge status: Home / Self Care | Payer: Self-pay | Source: Ambulatory Visit | Attending: Family Medicine | Admitting: Family Medicine

## 2022-10-24 VITALS — BP 124/80 | HR 80 | Temp 98.7°F | Resp 20

## 2022-10-24 DIAGNOSIS — R102 Pelvic and perineal pain: Secondary | ICD-10-CM | POA: Insufficient documentation

## 2022-10-24 DIAGNOSIS — N760 Acute vaginitis: Secondary | ICD-10-CM | POA: Insufficient documentation

## 2022-10-24 LAB — POCT URINALYSIS DIP (MANUAL ENTRY)
Bilirubin, UA: NEGATIVE
Glucose, UA: NEGATIVE mg/dL
Nitrite, UA: NEGATIVE
Protein Ur, POC: 30 mg/dL — AB
Spec Grav, UA: 1.025 (ref 1.010–1.025)
Urobilinogen, UA: 0.2 E.U./dL
pH, UA: 6 (ref 5.0–8.0)

## 2022-10-24 MED ORDER — NITROFURANTOIN MONOHYD MACRO 100 MG PO CAPS: 100.0000 mg | ORAL_CAPSULE | Freq: Two times a day (BID) | ORAL | 0 refills | Status: DC

## 2022-10-24 NOTE — ED Triage Notes (Addendum)
Pt reports low abdominal pain, strong odor urine, and vagina has a  "fishy" odor x 2 weeks   Pt wants std testing as well. No HIV or syphilis requested at this time.

## 2022-10-24 NOTE — Discharge Instructions (Signed)
I have sent over an antibiotic for a possible urinary tract infection.  Your vaginal swab and urine culture are currently pending and once these are back we can make decisions on if we need to change your regimen

## 2022-10-24 NOTE — ED Provider Notes (Signed)
[facility]    CSN: [id number] Arrival date & time: 10/24/22  1254      History   Chief Complaint Chief Complaint  Patient presents with   Abdominal Pain    HPI Rachel Stevens is a 42 y.o. female.   Patient presenting today with 2-week history of suprapubic pressure and pain, strong urine odor, fishy vaginal odor.  Denies fever, chills, nausea, vomiting, hematuria, flank pain.  Requesting STD swab as well.  No exposures, just wanting screening.  Not tried anything over-the-counter for symptoms.    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   Hypertension    Irregular periods 01/31/2015   Meniere disease    Missed period 11/01/2014   Patient desires pregnancy 11/01/2014   Pelvic floor tension    Vaginal discharge 11/01/2014   Vulvar pain    Weight gain 11/01/2014    Patient Active Problem List   Diagnosis Date Noted   Anxiety 08/19/2020   Breast mass in female 03/04/2020   Elevated blood pressure reading 02/21/2020   Obesity BMI=30.9  160 lbs 07/05/2019   Pelvic "burning" x3 years 07/05/2019   History of reduction surgery of right breast 2009 07/05/2019   Depression 07/05/2015   Dizziness of unknown cause 07/05/2015   Fatigue 07/05/2015   Vitamin B12 deficiency 07/05/2015   Vitamin D insufficiency 07/05/2015   Vulvar pain 07/05/2015   Irregular periods 01/31/2015   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 Authorizing Provider  nitrofurantoin, macrocrystal-monohydrate, (MACROBID) 100 MG capsule Take 1 capsule (100 mg total) by mouth 2 (two) times daily. 10/24/22  Yes Volney American, PA-C  cyclobenzaprine (FLEXERIL) 5 MG tablet Take 1 tablet (5 mg total) by mouth 3 (three) times daily as needed. 09/30/22   Menshew, Dannielle Karvonen, PA-C  gabapentin (NEURONTIN) 300 MG capsule Take 1 capsule (300 mg total) by mouth 3 (three) times daily. 09/30/22 11/29/22  Menshew, Dannielle Karvonen, PA-C  methocarbamol (ROBAXIN) 500 MG tablet Take 1 tablet (500 mg total) by mouth 3 (three) times daily. 09/29/22   Triplett, Tammy, PA-C  Multiple Vitamins-Minerals (MULTIVITAMIN WITH MINERALS) tablet Take 1 tablet by mouth daily.    [provider]  valACYclovir (VALTREX) 500 MG tablet Take 500 mg by mouth daily. 09/18/22   [provider]    Family History Family History  Problem Relation Age of Onset   Hypertension Mother    Heart disease Mother    Cancer Father        throat   Pancreatic cancer Father    Congestive Heart Failure Brother  Heart disease Maternal Grandmother    Hypertension Maternal Grandmother    Cancer Maternal Grandfather    Hypertension Maternal Grandfather    Cancer Paternal Grandmother        breast   Breast cancer Maternal Aunt     Social History Social History   Tobacco Use   Smoking status: Never   Smokeless tobacco: Never  Vaping Use   Vaping Use: Never used  Substance Use Topics   Alcohol use: Not Currently   Drug use: No     Allergies   Bee venom, Latex, and Penicillins   Review of Systems Review of Systems Per HPI  Physical Exam Triage Vital Signs ED Triage Vitals  Enc Vitals Group     BP 10/24/22 1329 124/80     Pulse Rate 10/24/22 1329 80     Resp 10/24/22 1329 20     Temp 10/24/22 1329 98.7 F (37.1 C)     Temp Source 10/24/22 1329 Oral     SpO2 10/24/22 1329 97 %     Weight --      Height --       Head Circumference --      Peak Flow --      Pain Score 10/24/22 1333 4     Pain Loc --      Pain Edu? --      Excl. in [place]? --    No data found.  Updated Vital Signs BP 124/80 (BP Location: Right Arm)   Pulse 80   Temp 98.7 F (37.1 C) (Oral)   Resp 20   LMP 10/12/2022 (Exact Date)   SpO2 97%   Visual Acuity Right Eye Distance:   Left Eye Distance:   Bilateral Distance:    Right Eye Near:   Left Eye Near:    Bilateral Near:     Physical Exam Vitals and nursing note reviewed.  Constitutional:      Appearance: Normal appearance. She is not ill-appearing.  HENT:     Head: Atraumatic.  Eyes:     Extraocular Movements: Extraocular movements intact.     Conjunctiva/sclera: Conjunctivae normal.  Cardiovascular:     Rate and Rhythm: Normal rate and regular rhythm.     Heart sounds: Normal heart sounds.  Pulmonary:     Effort: Pulmonary effort is normal.     Breath sounds: Normal breath sounds.  Abdominal:     General: Bowel sounds are normal. There is no distension.     Palpations: Abdomen is soft.     Tenderness: There is no abdominal tenderness. There is no right CVA tenderness, left CVA tenderness or guarding.  Genitourinary:    Comments: GU exam deferred, self swab performed Musculoskeletal:        General: Normal range of motion.     Cervical back: Normal range of motion and neck supple.  Skin:    General: Skin is warm and dry.  Neurological:     Mental Status: She is alert and oriented to person, place, and time.  Psychiatric:        Mood and Affect: Mood normal.        Thought Content: Thought content normal.        Judgment: Judgment normal.      UC Treatments / Results  Labs (all labs ordered are listed, but only abnormal results are displayed) Labs Reviewed  POCT URINALYSIS DIP (MANUAL ENTRY) - Abnormal; Notable for the following components:      Result Value  Ketones, POC UA trace (5) (*)    Blood, UA trace-intact (*)    Protein Ur, POC =30  (*)    Leukocytes, UA Large (3+) (*)    All other components within normal limits  URINE CULTURE  CERVICOVAGINAL ANCILLARY ONLY    EKG   Radiology No results found.  Procedures Procedures (including critical care time)  Medications Ordered in UC Medications - No data to display  Initial Impression / Assessment and Plan / UC Course  I have reviewed the triage vital signs and the nursing notes.  Pertinent labs & imaging results that were available during my care of the patient were reviewed by me and considered in my medical decision making (see chart for details).     Vitals and exam reassuring today, urinalysis with evidence of a possible urinary tract infection so we will treat with Macrobid while awaiting urine culture and vaginal swab for further information.  Adjust if needed based on these results.  Discussed supportive home care and return precautions.  Final Clinical Impressions(s) / UC Diagnoses   Final diagnoses:  Suprapubic pain  Acute vaginitis     Discharge Instructions      I have sent over an antibiotic for a possible urinary tract infection.  Your vaginal swab and urine culture are currently pending and once these are back we can make decisions on if we need to change your regimen    ED Prescriptions     Medication Sig Dispense Auth. Provider   nitrofurantoin, macrocrystal-monohydrate, (MACROBID) 100 MG capsule Take 1 capsule (100 mg total) by mouth 2 (two) times daily. 10 capsule Volney American, [place]      PDMP not reviewed this encounter.   Volney American, [place] 10/24/22 1358

## 2022-10-26 LAB — CERVICOVAGINAL ANCILLARY ONLY
Bacterial Vaginitis (gardnerella): NEGATIVE
Candida Glabrata: NEGATIVE
Candida Vaginitis: NEGATIVE
Chlamydia: NEGATIVE
Comment: NEGATIVE
Comment: NEGATIVE
Comment: NEGATIVE
Comment: NEGATIVE
Comment: NEGATIVE
Comment: NORMAL
Neisseria Gonorrhea: NEGATIVE
Trichomonas: NEGATIVE

## 2022-10-27 ENCOUNTER — Emergency Department (HOSPITAL_COMMUNITY)
Admission: EM | Admit: 2022-10-27 | Discharge: 2022-10-27 | Disposition: A | Discharge status: Home / Self Care | Payer: Self-pay | Attending: Emergency Medicine | Admitting: Emergency Medicine

## 2022-10-27 ENCOUNTER — Emergency Department (HOSPITAL_COMMUNITY): Payer: Self-pay

## 2022-10-27 ENCOUNTER — Encounter (HOSPITAL_COMMUNITY): Payer: Self-pay

## 2022-10-27 ENCOUNTER — Other Ambulatory Visit: Payer: Self-pay

## 2022-10-27 DIAGNOSIS — R109 Unspecified abdominal pain: Secondary | ICD-10-CM | POA: Insufficient documentation

## 2022-10-27 DIAGNOSIS — I10 Essential (primary) hypertension: Secondary | ICD-10-CM | POA: Insufficient documentation

## 2022-10-27 DIAGNOSIS — R300 Dysuria: Secondary | ICD-10-CM | POA: Insufficient documentation

## 2022-10-27 LAB — CBC
HCT: 34.9 % — ABNORMAL LOW (ref 36.0–46.0)
Hemoglobin: 11.3 g/dL — ABNORMAL LOW (ref 12.0–15.0)
MCH: 28.6 pg (ref 26.0–34.0)
MCHC: 32.4 g/dL (ref 30.0–36.0)
MCV: 88.4 fL (ref 80.0–100.0)
Platelets: 243 10*3/uL (ref 150–400)
RBC: 3.95 MIL/uL (ref 3.87–5.11)
RDW: 13.6 % (ref 11.5–15.5)
WBC: 6.6 10*3/uL (ref 4.0–10.5)
nRBC: 0 % (ref 0.0–0.2)

## 2022-10-27 LAB — COMPREHENSIVE METABOLIC PANEL
ALT: 15 U/L (ref 0–44)
AST: 24 U/L (ref 15–41)
Albumin: 3.8 g/dL (ref 3.5–5.0)
Alkaline Phosphatase: 67 U/L (ref 38–126)
Anion gap: 8 (ref 5–15)
BUN: 8 mg/dL (ref 6–20)
CO2: 23 mmol/L (ref 22–32)
Calcium: 8.7 mg/dL — ABNORMAL LOW (ref 8.9–10.3)
Chloride: 106 mmol/L (ref 98–111)
Creatinine, Ser: 0.99 mg/dL (ref 0.44–1.00)
GFR, Estimated: >60 mL/min (ref >60)
Glucose, Bld: 109 mg/dL — ABNORMAL HIGH (ref 70–99)
Potassium: 3.6 mmol/L (ref 3.5–5.1)
Sodium: 137 mmol/L (ref 135–145)
Total Bilirubin: 0.5 mg/dL (ref 0.3–1.2)
Total Protein: 7.1 g/dL (ref 6.5–8.1)

## 2022-10-27 LAB — URINALYSIS, ROUTINE W REFLEX MICROSCOPIC
Bilirubin Urine: NEGATIVE
Glucose, UA: NEGATIVE mg/dL
Hgb urine dipstick: NEGATIVE
Ketones, ur: NEGATIVE mg/dL
Leukocytes,Ua: NEGATIVE
Nitrite: NEGATIVE
Protein, ur: NEGATIVE mg/dL
Specific Gravity, Urine: 1.015 (ref 1.005–1.030)
pH: 6 (ref 5.0–8.0)

## 2022-10-27 LAB — URINE CULTURE: Culture: 10000 — AB

## 2022-10-27 LAB — PREGNANCY, URINE: Preg Test, Ur: NEGATIVE

## 2022-10-27 MED ORDER — KETOROLAC TROMETHAMINE 15 MG/ML IJ SOLN
15.0000 mg | Freq: Once | INTRAMUSCULAR | Status: AC
  Administered 2022-10-27: 15 mg via INTRAVENOUS
  Filled 2022-10-27: qty 1

## 2022-10-27 MED ORDER — CEPHALEXIN 500 MG PO CAPS: 500.0000 mg | ORAL_CAPSULE | Freq: Four times a day (QID) | ORAL | 0 refills | Status: DC

## 2022-10-27 MED ORDER — SODIUM CHLORIDE 0.9 % IV SOLN
2.0000 g | Freq: Once | INTRAVENOUS | Status: AC
  Administered 2022-10-27: 2 g via INTRAVENOUS
  Filled 2022-10-27: qty 20

## 2022-10-27 MED ORDER — SODIUM CHLORIDE 0.9 % IV BOLUS
1000.0000 mL | Freq: Once | INTRAVENOUS | Status: AC
  Administered 2022-10-27: 1000 mL via INTRAVENOUS

## 2022-10-27 NOTE — ED Provider Notes (Signed)
Care of patient assumed from Dr. Sedonia Small.  This patient has had recent urinary symptoms.  She has been on Bactrim for the past 3 days.  Recent urine cultures grew 10,000 colonies of E. coli.  Susceptibilities are pending. Currently awaiting CT scan. Physical Exam  BP 130/87   Pulse 77   Temp 99.4 F (37.4 C) (Oral)   Resp 18   Wt 82.1 kg   LMP 10/12/2022 (Exact Date)   SpO2 100%   BMI 29.21 kg/m   Physical Exam Vitals and nursing note reviewed.  Constitutional:      General: She is not in acute distress.    Appearance: She is well-developed. She is not ill-appearing, toxic-appearing or diaphoretic.  HENT:     Head: Normocephalic and atraumatic.  Eyes:     General: No scleral icterus.    Extraocular Movements: Extraocular movements intact.     Conjunctiva/sclera: Conjunctivae normal.  Cardiovascular:     Rate and Rhythm: Normal rate and regular rhythm.  Pulmonary:     Effort: Pulmonary effort is normal. No respiratory distress.  Abdominal:     Palpations: Abdomen is soft.     Tenderness: There is no abdominal tenderness. There is left CVA tenderness. There is no right CVA tenderness.  Musculoskeletal:        General: No swelling.     Cervical back: Neck supple.  Skin:    General: Skin is warm and dry.     Coloration: Skin is not cyanotic, jaundiced or pale.  Neurological:     General: No focal deficit present.     Mental Status: She is alert and oriented to person, place, and time.  Psychiatric:        Mood and Affect: Mood normal.        Behavior: Behavior normal.     Procedures  Procedures  ED Course / MDM    Medical Decision Making Amount and/or Complexity of Data Reviewed Labs: ordered. Radiology: ordered.  Risk Prescription drug management.   On assessment, patient resting on ED stretcher.  She states that she feels improved after IV fluids and Toradol.  She states that the recent symptoms she has had include pain in area of left CVA, nausea, and urinary  urgency. CT scan showed no acute findings.  Patient was advised to continue ibuprofen as needed.  She has a documented allergy to penicillins.  She did receive ceftriaxone in the ED without any unwanted side effects.  Bactrim was switched to Keflex.  She was discharged in good condition.       Godfrey Pick, MD 10/27/22 [phone]

## 2022-10-27 NOTE — Discharge Instructions (Addendum)
Stop taking your Bactrim.  A prescription for new antibiotic was sent to your pharmacy.  Take this as prescribed for the next 5 days, starting tomorrow.  Take ibuprofen as needed for lower back pain.  Follow-up with your primary care doctor when possible.  Return to the emergency department for any new or worsening symptoms of concern.

## 2022-10-27 NOTE — ED Provider Notes (Signed)
[facility] Emergency Department Provider Note MRN:  [id number]  Arrival date & time: 10/27/22     Chief Complaint   Abdominal Pain   History of Present Illness   Rachel Stevens is a 42 y.o. year-old female with a history of hypertension presenting to the ED with chief complaint of flank pain.  Patient was having dysuria several days ago and was diagnosed with a UTI and started on nitrofurantoin.  About 3 days ago patient's antibiotic was changed to Bactrim.  Despite 3 days of Bactrim patient's symptoms continue to worsen.  Now having left flank pain.  Feeling generally unwell.  Review of Systems  A thorough review of systems was obtained and all systems are negative except as noted in the HPI and PMH.   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   Hypertension    Irregular periods 01/31/2015   Meniere disease    Missed period 11/01/2014   Patient desires pregnancy 11/01/2014   Pelvic floor tension    Vaginal discharge 11/01/2014   Vulvar pain    Weight gain 11/01/2014    Past Surgical History:  Procedure Laterality Date   BREAST REDUCTION SURGERY     REDUCTION MAMMAPLASTY Right     Family History  Problem Relation Age of Onset   Hypertension Mother    Heart disease Mother    Cancer Father        throat   Pancreatic cancer Father    Congestive Heart Failure Brother    Heart disease Maternal Grandmother    Hypertension Maternal Grandmother    Cancer Maternal Grandfather    Hypertension Maternal Grandfather    Cancer Paternal Grandmother        breast   Breast cancer Maternal Aunt     Social History   Socioeconomic History   Marital status: Divorced    Spouse name: Not on file   Number of children: 9   Years of education: 12   Highest education level: Not on file   Occupational History   Not on file  Tobacco Use   Smoking status: Never   Smokeless tobacco: Never  Vaping Use   Vaping Use: Never used  Substance and Sexual Activity   Alcohol use: Not Currently   Drug use: No   Sexual activity: Yes    Partners: Male    Birth control/protection: Condom  Other Topics Concern   Not on file  Social History Narrative   Not on file   Social Determinants of Health   Financial Resource Strain: Not on file  Food Insecurity: Not on file  Transportation Needs: Not on file  Physical Activity: Not on file  Stress: Not on file  Social Connections: Not on file  Intimate Partner Violence: Not At Risk (03/27/2022)   Humiliation, Afraid, Rape, and Kick questionnaire    Fear of Current or Ex-Partner: No    Emotionally Abused: No    Physically Abused: No    Sexually Abused: No     Physical Exam   Vitals:   10/27/22 0622  BP: 130/87  Pulse: 77  Resp: 18  Temp: 99.4 F (37.4 C)  SpO2: 100%    CONSTITUTIONAL: Well-appearing, NAD NEURO/PSYCH:  Alert and oriented x 3, no focal deficits EYES:  eyes  equal and reactive ENT/NECK:  no LAD, no JVD CARDIO: Regular rate, well-perfused, normal S1 and S2 PULM:  CTAB no wheezing or rhonchi GI/GU:  non-distended, non-tender MSK/SPINE:  No gross deformities, no edema SKIN:  no rash, atraumatic   *Additional and/or pertinent findings included in MDM below  Diagnostic and Interventional Summary    EKG Interpretation  Date/Time:    Ventricular Rate:    PR Interval:    QRS Duration:   QT Interval:    QTC Calculation:   R Axis:     Text Interpretation:         Labs Reviewed  CBC  COMPREHENSIVE METABOLIC PANEL  URINALYSIS, ROUTINE W REFLEX MICROSCOPIC  PREGNANCY, URINE    CT RENAL STONE STUDY    (Results Pending)    Medications  cefTRIAXone (ROCEPHIN) 2 g in sodium chloride 0.9 % 100 mL IVPB (has no administration in time range)  sodium chloride 0.9 % bolus 1,000 mL (has no administration  in time range)  ketorolac (TORADOL) 15 MG/ML injection 15 mg (has no administration in time range)     Procedures  /  Critical Care Procedures  ED Course and Medical Decision Making  Initial Impression and Ddx Suspicious for pyelonephritis, patient's urine cultured susceptibilities are still pending.  Seems to have failed both nitrofurantoin and Bactrim.  Will provide dose of IV ceftriaxone and obtain CT imaging to exclude concomitant kidney stone as the cause of the failed treatment.  Past medical/surgical history that increases complexity of ED encounter: None  Interpretation of Diagnostics Labs and CT imaging pending  Patient Reassessment and Ultimate Disposition/Management     Signed out to oncoming provider, suspect patient will be appropriate for discharge on Keflex  Patient management required discussion with the following services or consulting groups:  None  Complexity of Problems Addressed Acute illness or injury that poses threat of life of bodily function  Additional Data Reviewed and Analyzed Further history obtained from: Recent PCP notes  Additional Factors Impacting ED Encounter Risk Prescriptions  Barth Kirks. [place] Small, [place] [email or web address]  Final Clinical Impressions(s) / ED Diagnoses     ICD-10-CM   1. Flank pain  R10.9       ED Discharge Orders     None        Discharge Instructions Discussed with and Provided to Patient:   Discharge Instructions   None      Maudie Flakes, MD 10/27/22 [phone]

## 2022-10-27 NOTE — ED Triage Notes (Addendum)
Pt presents with abdominal pain and lower back pain. Pt was diagnosed with UTI on Saturday at [facility] and given oral antibiotics. Pt states he feels no better and the pain is waking her up out of her sleep.

## 2022-11-30 ENCOUNTER — Ambulatory Visit
Admission: RE | Admit: 2022-11-30 | Discharge: 2022-11-30 | Disposition: A | Discharge status: Home / Self Care | Payer: BC Managed Care – PPO | Source: Ambulatory Visit | Attending: Nurse Practitioner | Admitting: Nurse Practitioner

## 2022-11-30 VITALS — BP 121/86 | HR 73 | Temp 97.9°F | Resp 18

## 2022-11-30 DIAGNOSIS — R6889 Other general symptoms and signs: Secondary | ICD-10-CM | POA: Insufficient documentation

## 2022-11-30 DIAGNOSIS — Z1152 Encounter for screening for COVID-19: Secondary | ICD-10-CM | POA: Insufficient documentation

## 2022-11-30 NOTE — Discharge Instructions (Addendum)
You have a viral upper respiratory infection.  Symptoms should improve over the next week to 10 days.  If you develop chest pain or shortness of breath, go to the emergency room.  We have tested you today for COVID-19.  You will see the results in Mychart and we will call you with positive results.  Please stay home and isolate until you are aware of the results.  As we discussed, I am suspicious that you have the flu.  Since it has been more than 48 hours since your symptoms started, we did not test you for it because the treatment will not change.  Some things that can make you feel better are: - Increased rest - Increasing fluid with water/sugar free electrolytes - Acetaminophen and ibuprofen as needed for fever/pain - Salt water gargling, chloraseptic spray and throat lozenges for sore throat - OTC guaifenesin (Mucinex) 600 mg twice daily for congestion - Saline sinus flushes or a neti pot - Humidifying the air for congestion

## 2022-11-30 NOTE — ED Triage Notes (Signed)
Headache, fatigue, nausea, pt states she is feeling dehydrated would like IV fluids. Started 3 days ago. Pt states she was around someone who was positive for flu recently. Taking ibuprofen.

## 2022-11-30 NOTE — ED Provider Notes (Signed)
[facility]    CSN: [id number] Arrival date & time: 11/30/22  1445      History   Chief Complaint Chief Complaint  Patient presents with   Fatigue    Dehydrated - Entered by patient   Headache    HPI Rachel Stevens is a 42 y.o. female.   Patient presents today for 3-day history of fatigue, decreased appetite, nausea without vomiting, and headache.  She denies fever, body aches, chills, dry or congested cough, shortness of breath or chest pain, runny or stuffy nose, sore throat, ear pain, abdominal pain, vomiting, and diarrhea.  Reports she felt dizzy yesterday, but has not felt dizzy today.  Reports she sat most of the day today, however did tolerate some beet juice this morning.  Reports she usually drinks 7 bottles of water daily and has not drink any today and is concerned she is dehydrated.  She reports she feels like her mouth is dry.  Reports last week, she was around somebody who tested positive for the flu.    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   Hypertension    Irregular periods 01/31/2015   Meniere disease    Missed period 11/01/2014   Patient desires pregnancy 11/01/2014   Pelvic floor tension    Vaginal discharge 11/01/2014   Vulvar pain    Weight gain 11/01/2014    Patient Active Problem List   Diagnosis Date Noted   Anxiety 08/19/2020   Breast mass in female 03/04/2020   Elevated blood pressure reading 02/21/2020   Obesity BMI=30.9  160 lbs 07/05/2019   Pelvic "burning" x3 years 07/05/2019   History of reduction surgery of right breast 2009 07/05/2019   Depression 07/05/2015   Dizziness of unknown cause 07/05/2015   Fatigue 07/05/2015   Vitamin B12 deficiency 07/05/2015   Vitamin D insufficiency 07/05/2015   Vulvar pain 07/05/2015   Irregular periods 01/31/2015   HSV-2 (herpes simplex virus 2)  infection 11/15/2014   Female infertility 07/15/2011    Past Surgical History:  Procedure Laterality Date   BREAST REDUCTION SURGERY     REDUCTION MAMMAPLASTY Right     OB History     Gravida  1   Para  0   Term  0   Preterm  0   AB  1   Living  0      SAB  1   IAB  0   Ectopic  0   Multiple  0   Live Births  0            Home Medications    Prior to Admission medications   Medication Sig Start Date End Date Taking? Authorizing Provider  ferrous sulfate 325 (65 FE) MG tablet Take 325 mg by mouth daily with breakfast.   Yes [provider]  Multiple Vitamins-Minerals (MULTIVITAMIN WITH MINERALS) tablet Take 1 tablet by mouth daily.   Yes [provider]  cephALEXin (KEFLEX) 500 MG capsule Take 1 capsule (500 mg total) by mouth 4 (four) times daily. 10/27/22   Godfrey Pick, MD  cyclobenzaprine (FLEXERIL) 5 MG tablet Take 1 tablet (5 mg total) by mouth 3 (three) times daily as needed. 09/30/22   Menshew, Dannielle Karvonen, PA-C  gabapentin (NEURONTIN) 300 MG capsule Take 1 capsule (300 mg total) by  mouth 3 (three) times daily. 09/30/22 11/29/22  Menshew, Dannielle Karvonen, PA-C  methocarbamol (ROBAXIN) 500 MG tablet Take 1 tablet (500 mg total) by mouth 3 (three) times daily. 09/29/22   Triplett, Tammy, PA-C  valACYclovir (VALTREX) 500 MG tablet Take 500 mg by mouth daily. 09/18/22   [provider]    Family History Family History  Problem Relation Age of Onset   Hypertension Mother    Heart disease Mother    Cancer Father        throat   Pancreatic cancer Father    Congestive Heart Failure Brother    Heart disease Maternal Grandmother    Hypertension Maternal Grandmother    Cancer Maternal Grandfather    Hypertension Maternal Grandfather    Cancer Paternal Grandmother        breast   Breast cancer Maternal Aunt     Social History Social History   Tobacco Use   Smoking status: Never   Smokeless tobacco: Never  Vaping Use    Vaping Use: Never used  Substance Use Topics   Alcohol use: Not Currently   Drug use: No     Allergies   Bee venom, Latex, and Penicillins   Review of Systems Review of Systems Per HPI  Physical Exam Triage Vital Signs ED Triage Vitals [11/30/22 1601]  Enc Vitals Group     BP 121/86     Pulse Rate 73     Resp 18     Temp 97.9 F (36.6 C)     Temp Source Oral     SpO2 98 %     Weight      Height      Head Circumference      Peak Flow      Pain Score 7     Pain Loc      Pain Edu?      Excl. in [place]?    Orthostatic VS for the past 24 hrs:  BP- Lying Pulse- Lying BP- Sitting Pulse- Sitting BP- Standing at 0 minutes Pulse- Standing at 0 minutes  11/30/22 1605 117/75 66 117/82 70 106/82 94    Updated Vital Signs BP 121/86 (BP Location: Right Arm)   Pulse 73   Temp 97.9 F (36.6 C) (Oral)   Resp 18   LMP 11/01/2022 (Approximate)   SpO2 98%   Visual Acuity Right Eye Distance:   Left Eye Distance:   Bilateral Distance:    Right Eye Near:   Left Eye Near:    Bilateral Near:     Physical Exam Vitals and nursing note reviewed.  Constitutional:      General: She is not in acute distress.    Appearance: She is well-developed. She is not toxic-appearing.  HENT:     Head: Normocephalic and atraumatic.     Right Ear: Tympanic membrane, ear canal and external ear normal. There is no impacted cerumen.     Left Ear: Tympanic membrane, ear canal and external ear normal. There is no impacted cerumen.     Nose: Nose normal. No congestion or rhinorrhea.     Mouth/Throat:     Mouth: Mucous membranes are moist.     Pharynx: Oropharynx is clear.  Eyes:     General: No scleral icterus.    Extraocular Movements: Extraocular movements intact.     Right eye: Normal extraocular motion.     Left eye: Normal extraocular motion.  Cardiovascular:     Rate and Rhythm: Normal rate and  regular rhythm.  Pulmonary:     Effort: Pulmonary effort is normal. No respiratory distress.      Breath sounds: Normal breath sounds. No wheezing, rhonchi or rales.  Abdominal:     General: Bowel sounds are normal. There is no distension.     Palpations: Abdomen is soft.     Tenderness: There is no abdominal tenderness.  Musculoskeletal:     Cervical back: Normal range of motion.  Lymphadenopathy:     Cervical: No cervical adenopathy.  Skin:    General: Skin is warm and dry.     Capillary Refill: Capillary refill takes less than 2 seconds.     Findings: No rash.  Neurological:     Mental Status: She is alert and oriented to person, place, and time.  Psychiatric:        Behavior: Behavior is cooperative.      UC Treatments / Results  Labs (all labs ordered are listed, but only abnormal results are displayed) Labs Reviewed  SARS CORONAVIRUS 2 (TAT 6-24 HRS)    EKG   Radiology No results found.  Procedures Procedures (including critical care time)  Medications Ordered in UC Medications - No data to display  Initial Impression / Assessment and Plan / UC Course  I have reviewed the triage vital signs and the nursing notes.  Pertinent labs & imaging results that were available during my care of the patient were reviewed by me and considered in my medical decision making (see chart for details).   Patient is well-appearing, normotensive, afebrile, not tachycardic, not tachypneic, oxygenating well on room air.    1. Flu-like symptoms 2. Encounter for screening for COVID-19 I am suspicious for influenza Influenza testing deferred today given length of symptoms and would not change treatment COVID-19 testing obtained for rule out Supportive care discussed ER and return precautions discussed with patient Reassurance provided that vital signs are stable, she does not have signs of dehydration today.  Push fluids at home.  The patient was given the opportunity to ask questions.  All questions answered to their satisfaction.  The patient is in agreement to this  plan.    Final Clinical Impressions(s) / UC Diagnoses   Final diagnoses:  Flu-like symptoms  Encounter for screening for COVID-19     Discharge Instructions      You have a viral upper respiratory infection.  Symptoms should improve over the next week to 10 days.  If you develop chest pain or shortness of breath, go to the emergency room.  We have tested you today for COVID-19.  You will see the results in Mychart and we will call you with positive results.  Please stay home and isolate until you are aware of the results.  As we discussed, I am suspicious that you have the flu.  Since it has been more than 48 hours since your symptoms started, we did not test you for it because the treatment will not change.  Some things that can make you feel better are: - Increased rest - Increasing fluid with water/sugar free electrolytes - Acetaminophen and ibuprofen as needed for fever/pain - Salt water gargling, chloraseptic spray and throat lozenges for sore throat - OTC guaifenesin (Mucinex) 600 mg twice daily for congestion - Saline sinus flushes or a neti pot - Humidifying the air for congestion     ED Prescriptions   None    PDMP not reviewed this encounter.   Eulogio Bear, NP 11/30/22 [phone]

## 2022-12-01 LAB — SARS CORONAVIRUS 2 (TAT 6-24 HRS): SARS Coronavirus 2: NEGATIVE

## 2022-12-26 ENCOUNTER — Emergency Department (HOSPITAL_COMMUNITY)
Admission: EM | Admit: 2022-12-26 | Discharge: 2022-12-26 | Disposition: A | Discharge status: Home / Self Care | Payer: BC Managed Care – PPO | Attending: Emergency Medicine | Admitting: Emergency Medicine

## 2022-12-26 ENCOUNTER — Encounter (HOSPITAL_COMMUNITY): Payer: Self-pay | Admitting: *Deleted

## 2022-12-26 ENCOUNTER — Other Ambulatory Visit: Payer: Self-pay

## 2022-12-26 DIAGNOSIS — B349 Viral infection, unspecified: Secondary | ICD-10-CM

## 2022-12-26 DIAGNOSIS — Z1152 Encounter for screening for COVID-19: Secondary | ICD-10-CM | POA: Diagnosis not present

## 2022-12-26 DIAGNOSIS — I10 Essential (primary) hypertension: Secondary | ICD-10-CM | POA: Insufficient documentation

## 2022-12-26 LAB — RESP PANEL BY RT-PCR (RSV, FLU A&B, COVID)  RVPGX2
Influenza A by PCR: NEGATIVE
Influenza B by PCR: NEGATIVE
Resp Syncytial Virus by PCR: NEGATIVE
SARS Coronavirus 2 by RT PCR: NEGATIVE

## 2022-12-26 LAB — PREGNANCY, URINE: Preg Test, Ur: NEGATIVE

## 2022-12-26 MED ORDER — METOCLOPRAMIDE HCL 10 MG PO TABS: 10.0000 mg | ORAL_TABLET | Freq: Three times a day (TID) | ORAL | 0 refills | Status: DC | PRN

## 2022-12-26 MED ORDER — SODIUM CHLORIDE 0.9 % IV BOLUS
1000.0000 mL | Freq: Once | INTRAVENOUS | Status: AC
  Administered 2022-12-26: 1000 mL via INTRAVENOUS

## 2022-12-26 MED ORDER — METOCLOPRAMIDE HCL 5 MG/ML IJ SOLN
10.0000 mg | Freq: Once | INTRAMUSCULAR | Status: AC
  Administered 2022-12-26: 10 mg via INTRAVENOUS
  Filled 2022-12-26: qty 2

## 2022-12-26 MED ORDER — KETOROLAC TROMETHAMINE 15 MG/ML IJ SOLN
15.0000 mg | Freq: Once | INTRAMUSCULAR | Status: AC
  Administered 2022-12-26: 15 mg via INTRAVENOUS

## 2022-12-26 MED ORDER — METOCLOPRAMIDE HCL 10 MG PO TABS
10.0000 mg | ORAL_TABLET | Freq: Once | ORAL | Status: DC
  Filled 2022-12-26: qty 1

## 2022-12-26 MED ORDER — KETOROLAC TROMETHAMINE 15 MG/ML IJ SOLN
15.0000 mg | Freq: Once | INTRAMUSCULAR | Status: DC
  Filled 2022-12-26: qty 1

## 2022-12-26 NOTE — Discharge Instructions (Addendum)
We evaluated you for your headaches, congestion and bodyaches.  Your pregnancy test was negative.  Your symptoms improved with nausea medication and fluids in the emergency department.  Your symptoms are most likely caused by an upper respiratory infection.  Your COVID test was negative.  Please be sure to drink lots of fluids. Please take Tylenol and Motrin for your symptoms at home.  You can take 1000 mg of Tylenol every 6 hours and 600 mg of ibuprofen every 6 hours as needed for your symptoms.  You can take these medicines together as needed, either at the same time, or alternating every 3 hours.   If you develop any new symptoms such as persistent vomiting, bloody vomit, abdominal pain, chest pain, vision changes, numbness or tingling, weakness, trouble swallowing, facial droop, or any other new symptoms, please return to the emergency department for reevaluation.

## 2022-12-26 NOTE — ED Triage Notes (Signed)
Pt c/o headache with sneezing, cough and chills x 3 days

## 2022-12-26 NOTE — ED Provider Notes (Signed)
[facility] Provider Note  CSN: [id number] Arrival date & time: 12/26/22 [id number]  Chief Complaint(s) Generalized Body Aches  HPI Rachel Stevens is a 42 y.o. female without significant past medical history presenting to the emergency department with bodyaches.  She reports 3 days of bodyaches, associated congestion and, runny nose, headache.  She reports fatigue.  No cough.  She also reports nausea, vomiting when she tries to eat.  No abdominal pain or diarrhea.  She did have sick contact with the flu.  No chest pain.  Symptoms mild.  Has been able to tolerate some fluids.  Came to emergency department because of the nausea and vomiting.   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   Hypertension    Irregular periods 01/31/2015   Meniere disease    Missed period 11/01/2014   Patient desires pregnancy 11/01/2014   Pelvic floor tension    Vaginal discharge 11/01/2014   Vulvar pain    Weight gain 11/01/2014   Patient Active Problem List   Diagnosis Date Noted   Anxiety 08/19/2020   Breast mass in female 03/04/2020   Elevated blood pressure reading 02/21/2020   Obesity BMI=30.9  160 lbs 07/05/2019   Pelvic "burning" x3 years 07/05/2019   History of reduction surgery of right breast 2009 07/05/2019   Depression 07/05/2015   Dizziness of unknown cause 07/05/2015   Fatigue 07/05/2015   Vitamin B12 deficiency 07/05/2015   Vitamin D insufficiency 07/05/2015   Vulvar pain 07/05/2015   Irregular periods 01/31/2015   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 Authorizing Provider  metoCLOPramide (REGLAN) 10 MG tablet Take 1 tablet (10 mg total) by mouth  every 8 (eight) hours as needed for nausea. 12/26/22  Yes Cristie Hem, MD  cephALEXin (KEFLEX) 500 MG capsule Take 1 capsule (500 mg total) by mouth 4 (four) times daily. 10/27/22   Godfrey Pick, MD  cyclobenzaprine (FLEXERIL) 5 MG tablet Take 1 tablet (5 mg total) by mouth 3 (three) times daily as needed. 09/30/22   Menshew, Dannielle Karvonen, PA-C  ferrous sulfate 325 (65 FE) MG tablet Take 325 mg by mouth daily with breakfast.    [provider]  gabapentin (NEURONTIN) 300 MG capsule Take 1 capsule (300 mg total) by mouth 3 (three) times daily. 09/30/22 11/29/22  Menshew, Dannielle Karvonen, PA-C  methocarbamol (ROBAXIN) 500 MG tablet Take 1 tablet (500 mg total) by mouth 3 (three) times daily. 09/29/22   Triplett, Tammy, PA-C  Multiple Vitamins-Minerals (MULTIVITAMIN WITH MINERALS) tablet Take 1 tablet by mouth daily.    [provider]  valACYclovir (VALTREX) 500 MG tablet Take 500 mg by mouth daily. 09/18/22   [provider]  Past Surgical History Past Surgical History:  Procedure Laterality Date   BREAST REDUCTION SURGERY     REDUCTION MAMMAPLASTY Right    Family History Family History  Problem Relation Age of Onset   Hypertension Mother    Heart disease Mother    Cancer Father        throat   Pancreatic cancer Father    Congestive Heart Failure Brother    Heart disease Maternal Grandmother    Hypertension Maternal Grandmother    Cancer Maternal Grandfather    Hypertension Maternal Grandfather    Cancer Paternal Grandmother        breast   Breast cancer Maternal Aunt     Social History Social History   Tobacco Use   Smoking status: Never   Smokeless tobacco: Never  Vaping Use   Vaping Use: Never used  Substance Use Topics   Alcohol use: Not Currently   Drug use: No   Allergies Bee venom, Latex, and  Penicillins  Review of Systems Review of Systems  All other systems reviewed and are negative.   Physical Exam Vital Signs  I have reviewed the triage vital signs BP 134/85 (BP Location: Right Arm)   Pulse 70   Temp 98.5 F (36.9 C) (Oral)   Resp 16   Ht 5\' 5"  (1.651 m)   Wt 79.8 kg   LMP 12/07/2022 (Approximate)   SpO2 100%   BMI 29.29 kg/m  Physical Exam Vitals and nursing note reviewed.  Constitutional:      General: She is not in acute distress.    Appearance: She is well-developed.  HENT:     Head: Normocephalic and atraumatic.     Mouth/Throat:     Mouth: Mucous membranes are moist.  Eyes:     Extraocular Movements: Extraocular movements intact.     Conjunctiva/sclera: Conjunctivae normal.     Pupils: Pupils are equal, round, and reactive to light.  Cardiovascular:     Rate and Rhythm: Normal rate and regular rhythm.     Heart sounds: No murmur heard. Pulmonary:     Effort: Pulmonary effort is normal. No respiratory distress.     Breath sounds: Normal breath sounds.  Abdominal:     General: Abdomen is flat.     Palpations: Abdomen is soft.     Tenderness: There is no abdominal tenderness.  Musculoskeletal:        General: No tenderness.     Right lower leg: No edema.     Left lower leg: No edema.  Skin:    General: Skin is warm and dry.  Neurological:     General: No focal deficit present.     Mental Status: She is alert and oriented to person, place, and time. Mental status is at baseline.     Cranial Nerves: No cranial nerve deficit.     Motor: No weakness.  Psychiatric:        Mood and Affect: Mood normal.        Behavior: Behavior normal.     ED Results and Treatments Labs (all labs ordered are listed, but only abnormal results are displayed) Labs Reviewed  RESP PANEL BY RT-PCR (RSV, FLU A&B, COVID)  RVPGX2  PREGNANCY, URINE  Radiology No results found.  Pertinent labs & imaging results that were available during my care of the patient were reviewed by me and considered in my medical decision making (see MDM for details).  Medications Ordered in ED Medications  ketorolac (TORADOL) 15 MG/ML injection 15 mg (15 mg Intravenous Given 12/26/22 0836)  metoCLOPramide (REGLAN) injection 10 mg (10 mg Intravenous Given 12/26/22 0843)  sodium chloride 0.9 % bolus 1,000 mL (1,000 mLs Intravenous New Bag/Given 12/26/22 0843)                                                                                                                                     Procedures Procedures  (including critical care time)  Medical Decision Making / ED Course   MDM:  42 year old female presenting to the emergency department with bodyaches.  Symptoms most consistent with upper respiratory infection.  Patient is very well-appearing.  Doubt dangerous cause of headaches, normal neurologic exam in the setting of URI symptoms.  No profuse nasal discharge or sinus pressure to suggest sinus infection.  Patient has had some nausea and vomiting but abdominal exam is benign and she denies abdominal pain, low concern for acute intra-abdominal pathology such as obstruction, pancreatitis, cholecystitis, appendicitis.  Will give medication for headache and symptoms as well as nausea medication.  Will check flu/COVID swab.  If patient feeling better and able to tolerate oral fluids, likely discharge with outpatient follow-up.  Will reassess. Clinical Course as of 12/26/22 1011  Sat Dec 26, 2022  1010 Patient feels better after medication.  COVID test is negative.  Urine pregnancy negative.  Symptoms most consistent with URI. Will discharge patient to home. All questions answered. Patient comfortable with plan of discharge. Return precautions discussed with patient and specified on the after visit summary.  [WS]    Clinical Course User Index [WS]  Cristie Hem, MD     Additional history obtained: -External records from outside source obtained and reviewed including: Chart review including previous notes, labs, imaging, consultation notes including PMD visit for fatigue 12/04/22   Lab Tests: -I ordered, reviewed, and interpreted labs.   The pertinent results include:   Labs Reviewed  RESP PANEL BY RT-PCR (RSV, FLU A&B, COVID)  RVPGX2  PREGNANCY, URINE    Notable for negative urine pregnancy  Medicines ordered and prescription drug management: Meds ordered this encounter  Medications   DISCONTD: ketorolac (TORADOL) 15 MG/ML injection 15 mg   DISCONTD: metoCLOPramide (REGLAN) tablet 10 mg   ketorolac (TORADOL) 15 MG/ML injection 15 mg   metoCLOPramide (REGLAN) injection 10 mg   sodium chloride 0.9 % bolus 1,000 mL   metoCLOPramide (REGLAN) 10 MG tablet    Sig: Take 1 tablet (10 mg total) by mouth every 8 (eight) hours as needed for nausea.    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    Final Clinical Impression(s) / ED Diagnoses Final diagnoses:  Viral syndrome     This chart was dictated using voice  recognition software.  Despite best efforts to proofread,  errors can occur which can change the documentation meaning.    Cristie Hem, MD 12/26/22 1011

## 2022-12-26 NOTE — ED Notes (Signed)
Pt aware we need urine sample, unable to go at this time 

## 2022-12-27 ENCOUNTER — Ambulatory Visit: Payer: BC Managed Care – PPO

## 2023-01-06 ENCOUNTER — Other Ambulatory Visit: Payer: Self-pay

## 2023-01-06 ENCOUNTER — Emergency Department (HOSPITAL_COMMUNITY)
Admission: EM | Admit: 2023-01-06 | Discharge: 2023-01-06 | Disposition: A | Discharge status: Home / Self Care | Payer: BC Managed Care – PPO | Attending: Emergency Medicine | Admitting: Emergency Medicine

## 2023-01-06 ENCOUNTER — Emergency Department (HOSPITAL_COMMUNITY): Payer: BC Managed Care – PPO

## 2023-01-06 DIAGNOSIS — Z91040 Latex allergy status: Secondary | ICD-10-CM | POA: Diagnosis not present

## 2023-01-06 DIAGNOSIS — R1084 Generalized abdominal pain: Secondary | ICD-10-CM | POA: Diagnosis present

## 2023-01-06 DIAGNOSIS — K529 Noninfective gastroenteritis and colitis, unspecified: Secondary | ICD-10-CM | POA: Insufficient documentation

## 2023-01-06 LAB — CBC WITH DIFFERENTIAL/PLATELET
Abs Immature Granulocytes: 0.03 10*3/uL (ref 0.00–0.07)
Basophils Absolute: 0 10*3/uL (ref 0.0–0.1)
Basophils Relative: 1 %
Eosinophils Absolute: 0.1 10*3/uL (ref 0.0–0.5)
Eosinophils Relative: 1 %
HCT: 38 % (ref 36.0–46.0)
Hemoglobin: 12.2 g/dL (ref 12.0–15.0)
Immature Granulocytes: 0 %
Lymphocytes Relative: 33 %
Lymphs Abs: 2.9 10*3/uL (ref 0.7–4.0)
MCH: 29.2 pg (ref 26.0–34.0)
MCHC: 32.1 g/dL (ref 30.0–36.0)
MCV: 90.9 fL (ref 80.0–100.0)
Monocytes Absolute: 0.8 10*3/uL (ref 0.1–1.0)
Monocytes Relative: 9 %
Neutro Abs: 5 10*3/uL (ref 1.7–7.7)
Neutrophils Relative %: 56 %
Platelets: 266 10*3/uL (ref 150–400)
RBC: 4.18 MIL/uL (ref 3.87–5.11)
RDW: 12.9 % (ref 11.5–15.5)
WBC: 8.8 10*3/uL (ref 4.0–10.5)
nRBC: 0 % (ref 0.0–0.2)

## 2023-01-06 LAB — URINALYSIS, ROUTINE W REFLEX MICROSCOPIC
Bilirubin Urine: NEGATIVE
Glucose, UA: NEGATIVE mg/dL
Hgb urine dipstick: NEGATIVE
Ketones, ur: NEGATIVE mg/dL
Leukocytes,Ua: NEGATIVE
Nitrite: NEGATIVE
Protein, ur: NEGATIVE mg/dL
Specific Gravity, Urine: 1.004 — ABNORMAL LOW (ref 1.005–1.030)
pH: 7 (ref 5.0–8.0)

## 2023-01-06 LAB — POC URINE PREG, ED: Preg Test, Ur: NEGATIVE

## 2023-01-06 LAB — COMPREHENSIVE METABOLIC PANEL
ALT: 19 U/L (ref 0–44)
AST: 29 U/L (ref 15–41)
Albumin: 4 g/dL (ref 3.5–5.0)
Alkaline Phosphatase: 71 U/L (ref 38–126)
Anion gap: 7 (ref 5–15)
BUN: 8 mg/dL (ref 6–20)
CO2: 26 mmol/L (ref 22–32)
Calcium: 8.9 mg/dL (ref 8.9–10.3)
Chloride: 102 mmol/L (ref 98–111)
Creatinine, Ser: 0.76 mg/dL (ref 0.44–1.00)
GFR, Estimated: >60 mL/min (ref >60)
Glucose, Bld: 104 mg/dL — ABNORMAL HIGH (ref 70–99)
Potassium: 3.7 mmol/L (ref 3.5–5.1)
Sodium: 135 mmol/L (ref 135–145)
Total Bilirubin: 0.4 mg/dL (ref 0.3–1.2)
Total Protein: 7.3 g/dL (ref 6.5–8.1)

## 2023-01-06 LAB — LIPASE, BLOOD: Lipase: 28 U/L (ref 11–51)

## 2023-01-06 MED ORDER — HYOSCYAMINE SULFATE 0.125 MG SL SUBL
0.2500 mg | SUBLINGUAL_TABLET | SUBLINGUAL | Status: AC
  Administered 2023-01-06: 0.25 mg via SUBLINGUAL
  Filled 2023-01-06: qty 2

## 2023-01-06 MED ORDER — LACTATED RINGERS IV BOLUS
1000.0000 mL | Freq: Once | INTRAVENOUS | Status: AC
  Administered 2023-01-06: 1000 mL via INTRAVENOUS

## 2023-01-06 MED ORDER — HYDROCODONE-ACETAMINOPHEN 5-325 MG PO TABS: 1.0000 | ORAL_TABLET | ORAL | 0 refills | Status: DC | PRN

## 2023-01-06 MED ORDER — METRONIDAZOLE 500 MG PO TABS
500.0000 mg | ORAL_TABLET | Freq: Once | ORAL | Status: AC
  Administered 2023-01-06: 500 mg via ORAL
  Filled 2023-01-06: qty 1

## 2023-01-06 MED ORDER — CIPROFLOXACIN HCL 500 MG PO TABS: 500.0000 mg | ORAL_TABLET | Freq: Two times a day (BID) | ORAL | 0 refills | Status: DC

## 2023-01-06 MED ORDER — IOHEXOL 300 MG/ML  SOLN
100.0000 mL | Freq: Once | INTRAMUSCULAR | Status: AC | PRN
  Administered 2023-01-06: 100 mL via INTRAVENOUS

## 2023-01-06 MED ORDER — METRONIDAZOLE 500 MG PO TABS: 500.0000 mg | ORAL_TABLET | Freq: Three times a day (TID) | ORAL | 0 refills | Status: DC

## 2023-01-06 MED ORDER — CIPROFLOXACIN HCL 250 MG PO TABS
500.0000 mg | ORAL_TABLET | Freq: Once | ORAL | Status: AC
  Administered 2023-01-06: 500 mg via ORAL
  Filled 2023-01-06: qty 2

## 2023-01-06 MED ORDER — ACETAMINOPHEN 500 MG PO TABS
1000.0000 mg | ORAL_TABLET | Freq: Once | ORAL | Status: AC
  Administered 2023-01-06: 1000 mg via ORAL
  Filled 2023-01-06: qty 2

## 2023-01-06 NOTE — ED Provider Notes (Signed)
[facility] Provider Note   CSN: [id number] Arrival date & time: 01/06/23  0418     History  Chief Complaint  Patient presents with   Constipation    Rachel Stevens is a 42 y.o. female.  Patient presents to the emergency department for evaluation of diffuse abdominal pain and cramping, distention and constipation.  Patient reports that she has had mild problems with constipation in the past but usually responds to treatment.  She has taken multiple laxatives including magnesium citrate and performed an enema at home with minimal results.       Home Medications Prior to Admission medications   Medication Sig Start Date End Date Taking? Authorizing Provider  ciprofloxacin (CIPRO) 500 MG tablet Take 1 tablet (500 mg total) by mouth 2 (two) times daily. 01/06/23  Yes Sena Clouatre, Canary Brimhristopher J, MD  HYDROcodone-acetaminophen (NORCO/VICODIN) 5-325 MG tablet Take 1 tablet by mouth every 4 (four) hours as needed for moderate pain. 01/06/23  Yes Celedonio Sortino, Canary Brimhristopher J, MD  metroNIDAZOLE (FLAGYL) 500 MG tablet Take 1 tablet (500 mg total) by mouth 3 (three) times daily. 01/06/23  Yes Daniah Zaldivar, Canary Brimhristopher J, MD  cephALEXin (KEFLEX) 500 MG capsule Take 1 capsule (500 mg total) by mouth 4 (four) times daily. 10/27/22   Gloris Manchesterixon, Ryan, MD  cyclobenzaprine (FLEXERIL) 5 MG tablet Take 1 tablet (5 mg total) by mouth 3 (three) times daily as needed. 09/30/22   Menshew, Charlesetta IvoryJenise V Bacon, PA-C  ferrous sulfate 325 (65 FE) MG tablet Take 325 mg by mouth daily with breakfast.    [provider]  gabapentin (NEURONTIN) 300 MG capsule Take 1 capsule (300 mg total) by mouth 3 (three) times daily. 09/30/22 11/29/22  Menshew, Charlesetta IvoryJenise V Bacon, PA-C  methocarbamol (ROBAXIN) 500 MG tablet Take 1 tablet (500 mg total) by mouth 3 (three) times daily. 09/29/22   Triplett, Tammy, PA-C  metoCLOPramide (REGLAN) 10 MG tablet Take 1 tablet (10 mg total) by mouth every 8 (eight) hours as  needed for nausea. 12/26/22   Lonell GrandchildScheving, William L, MD  Multiple Vitamins-Minerals (MULTIVITAMIN WITH MINERALS) tablet Take 1 tablet by mouth daily.    [provider]  valACYclovir (VALTREX) 500 MG tablet Take 500 mg by mouth daily. 09/18/22   [provider]      Allergies    Bee venom, Latex, and Penicillins    Review of Systems   Review of Systems  Physical Exam Updated Vital Signs BP (!) 136/91   Pulse 67   Resp 18   LMP 12/07/2022 (Approximate)   SpO2 100%  Physical Exam Vitals and nursing note reviewed.  Constitutional:      General: She is not in acute distress.    Appearance: She is well-developed.  HENT:     Head: Normocephalic and atraumatic.     Mouth/Throat:     Mouth: Mucous membranes are moist.  Eyes:     General: Vision grossly intact. Gaze aligned appropriately.     Extraocular Movements: Extraocular movements intact.     Conjunctiva/sclera: Conjunctivae normal.  Cardiovascular:     Rate and Rhythm: Normal rate and regular rhythm.     Pulses: Normal pulses.     Heart sounds: Normal heart sounds, S1 normal and S2 normal. No murmur heard.    No friction rub. No gallop.  Pulmonary:     Effort: Pulmonary effort is normal. No respiratory distress.     Breath sounds: Normal breath sounds.  Abdominal:  General: Bowel sounds are normal.     Palpations: Abdomen is soft.     Tenderness: There is generalized abdominal tenderness. There is no guarding or rebound.     Hernia: No hernia is present.  Musculoskeletal:        General: No swelling.     Cervical back: Full passive range of motion without pain, normal range of motion and neck supple. No spinous process tenderness or muscular tenderness. Normal range of motion.     Right lower leg: No edema.     Left lower leg: No edema.  Skin:    General: Skin is warm and dry.     Capillary Refill: Capillary refill takes less than 2 seconds.     Findings: No ecchymosis, erythema, rash or wound.   Neurological:     General: No focal deficit present.     Mental Status: She is alert and oriented to person, place, and time.     GCS: GCS eye subscore is 4. GCS verbal subscore is 5. GCS motor subscore is 6.     Cranial Nerves: Cranial nerves 2-12 are intact.     Sensory: Sensation is intact.     Motor: Motor function is intact.     Coordination: Coordination is intact.  Psychiatric:        Attention and Perception: Attention normal.        Mood and Affect: Mood normal.        Speech: Speech normal.        Behavior: Behavior normal.     ED Results / Procedures / Treatments   Labs (all labs ordered are listed, but only abnormal results are displayed) Labs Reviewed  COMPREHENSIVE METABOLIC PANEL - Abnormal; Notable for the following components:      Result Value   Glucose, Bld 104 (*)    All other components within normal limits  URINALYSIS, ROUTINE W REFLEX MICROSCOPIC - Abnormal; Notable for the following components:   Color, Urine STRAW (*)    Specific Gravity, Urine 1.004 (*)    All other components within normal limits  CBC WITH DIFFERENTIAL/PLATELET  LIPASE, BLOOD  POC URINE PREG, ED  POC URINE PREG, ED  I-STAT BETA HCG BLOOD, ED ([facility], [facility], [facility] ONLY)    EKG None  Radiology CT ABDOMEN PELVIS W CONTRAST  Result Date: 01/06/2023 CLINICAL DATA:  Acute, nonlocalized abdominal pain.  Generalized EXAM: CT ABDOMEN AND PELVIS WITH CONTRAST TECHNIQUE: Multidetector CT imaging of the abdomen and pelvis was performed using the standard protocol following bolus administration of intravenous contrast. RADIATION DOSE REDUCTION: This exam was performed according to the departmental dose-optimization program which includes automated exposure control, adjustment of the mA and/or kV according to patient size and/or use of iterative reconstruction technique. CONTRAST:  OMNIPAQUE IOHEXOL 300 MG/ML  SOLN COMPARISON:  10/27/2022 FINDINGS: Lower chest:  No contributory findings.  Hepatobiliary: No focal liver abnormality.No evidence of biliary obstruction or stone. Pancreas: Unremarkable. Spleen: Unremarkable. Adrenals/Urinary Tract: Negative adrenals. No hydronephrosis or stone. Unremarkable bladder. Stomach/Bowel: Diffuse colonic wall thickening and mucosal/pericolonic vascular prominence. Negative for appendicitis. No bowel obstruction Vascular/Lymphatic: No acute vascular abnormality. No mass or adenopathy. Reproductive:No pathologic findings. Other: Trace pelvic fluid which is likely reactive in this setting. Musculoskeletal: No acute abnormalities. IMPRESSION: Generalized colitis. Electronically Signed   By: Tiburcio Pea M.D.   On: 01/06/2023 06:57    Procedures Procedures    Medications Ordered in ED Medications  ciprofloxacin (CIPRO) tablet 500 mg (has no administration in  time range)  metroNIDAZOLE (FLAGYL) tablet 500 mg (has no administration in time range)  lactated ringers bolus 1,000 mL (1,000 mLs Intravenous New Bag/Given 01/06/23 0552)  hyoscyamine (LEVSIN SL) SL tablet 0.25 mg (0.25 mg Sublingual Given 01/06/23 0602)  acetaminophen (TYLENOL) tablet 1,000 mg (1,000 mg Oral Given 01/06/23 0602)  iohexol (OMNIPAQUE) 300 MG/ML solution 100 mL (100 mLs Intravenous Contrast Given 01/06/23 5009)    ED Course/ Medical Decision Making/ A&P                             Medical Decision Making Amount and/or Complexity of Data Reviewed Labs: ordered. Decision-making details documented in ED Course. Radiology: ordered and independent interpretation performed. Decision-making details documented in ED Course.  Risk OTC drugs. Prescription drug management.   Presented to the emergency department with 7 days of abdominal pain, cramping, sense of constipation.  She has appropriately tried multiple laxatives and enemas at home without results.  Differential diagnosis considered, based on this, includes but not limited to diverticulitis, colitis, abdominal mass, small  bowel obstruction as well as constipation.  Lab work reassuring.  CT abdomen and pelvis performed.  There is evidence of diffuse colitis.  I suspect that her sensation of constipation was secondary to the edema of her colon, not true constipation.  She will stop using any laxatives, will initiate Cipro and Flagyl, follow-up with GI.  Given return precautions.        Final Clinical Impression(s) / ED Diagnoses Final diagnoses:  Colitis    Rx / DC Orders ED Discharge Orders          Ordered    Ambulatory referral to Gastroenterology        01/06/23 0713    ciprofloxacin (CIPRO) 500 MG tablet  2 times daily        01/06/23 0717    metroNIDAZOLE (FLAGYL) 500 MG tablet  3 times daily        01/06/23 0717    HYDROcodone-acetaminophen (NORCO/VICODIN) 5-325 MG tablet  Every 4 hours PRN        01/06/23 0717              Gilda Crease, MD 01/06/23 [phone]

## 2023-01-06 NOTE — ED Triage Notes (Signed)
Pt states she has not had a bowel movement in 7 days, is now having generalized abdominal pain

## 2023-01-07 ENCOUNTER — Ambulatory Visit: Payer: BC Managed Care – PPO | Admitting: Internal Medicine

## 2023-01-07 NOTE — Progress Notes (Signed)
Ref[facility]: [facility] Primary Care Physician:  Rica Recordsel Orbe Polanco, Iliana, FNP Primary Gastroenterologist:  Dr. Jena Gaussourk  Chief Complaint  Patient presents with   Constipation    Has been feeling like she is not having a complete bowel movement. Has been using enemas and they are not working. Feels bloating. Also feels like something is stuck in her throat.      HPI:   Rachel Stevens is a 42 y.o. female presenting today at the request of [facility] for   Patient was seen in the emergency room 01/06/23 for diffuse abdominal pain, cramping, abdominal distension, and constipation. Had taken multiple laxatives including magnesium citrate and performed an enema at home with minimal results.  CT A/P with contrast showed diffuse colonic wall thickening.  No significant laboratory abnormalities.  She was prescribed Cipro, Flagyl x 10 days and advised follow-up with GI.  Today:  A couple weeks ago, she started to have a sensation of something being stuck in her throat.  No trouble with swallowing foods or liquids, just constantly feels like something is there.  She has had similar symptoms in the past and had an EGD at [facility].  States nothing was found but she was started on pantoprazole and symptoms resolved.  She recently ran out of pantoprazole and had been out for about 3 weeks, but restarted about 1-2 weeks ago.  She had also gained some weight, up to 190 pounds in January, and states every time she gains weight, she "gets this way".  She is been working on weight loss by walking and drinking lemon/lime water.  Also about 2 weeks ago, she developed some abdominal pain, primarily left-sided, as well as constipation.  Usually, she has soft bowel movements about 3 times a week, but started having incomplete bowel movements with worsening constipation.  She was taking MiraLAX daily, magnesium citrate, using enemas, but nothing seemed to help.  Her abdominal pain became severe on  Wednesday and she went to the emergency room.  They gave her an IV and she ended up having a very large bowel movement in the emergency room that was mushy with some mucus.  She has not had any watery bowel movements during this time.  Continues to have incomplete bowel movements, but no longer using enemas or anything else to help with constipation.  Denies BRBPR or melena.  She has started Flagyl, but did not pick up ciprofloxacin.  Her abdominal pain is not nearly as bad as it was on Wednesday.  She just feels like something is stuck inside on the left.  Reports she had a similar episode a couple of years ago and had a colonoscopy at [facility].  States nothing significant was found on the colonoscopy.  Denies any chronic abdominal pain or chronic diarrhea prior to this.    No family history of IBD or colon cancer.   Previously with mild normocytic anemia with hemoglobin in the 11 range.  Iron saturation low in July 2023.  Ferritin low normal at 13.6.  In December 2023, ferritin low at 9. Taking liquid iron.  Reports she does have heavy bleeding the first couple days of her period.  She has been taking ibuprofen 800 mg BID since December.   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 15 tablet 0   ferrous sulfate 325 (65 FE) MG tablet  Take 325 mg by mouth daily with breakfast.     metoCLOPramide (REGLAN) 10 MG tablet Take 1 tablet (10 mg total) by mouth every 8 (eight) hours as needed for nausea. 20 tablet 0   metroNIDAZOLE (FLAGYL) 500 MG tablet Take 1 tablet (500 mg total) by mouth 3 (three) times daily. 30 tablet 0   Multiple Vitamins-Minerals (MULTIVITAMIN WITH MINERALS) tablet Take 1 tablet by mouth daily.     pantoprazole (PROTONIX) 20 MG tablet Take 1 tablet by mouth daily.     valACYclovir (VALTREX) 500 MG tablet Take 500 mg by mouth daily.     ciprofloxacin (CIPRO) 500 MG tablet Take 1 tablet (500 mg total) by mouth 2 (two) times daily. (Patient not taking: Reported on 01/08/2023) 20 tablet 0   gabapentin (NEURONTIN) 300 MG capsule Take 1 capsule (300 mg total) by mouth 3 (three) times daily. (Patient not taking: Reported on 01/08/2023) 90 capsule 1   No current facility-administered medications for this visit.    Allergies as of 01/08/2023 - Review Complete 01/08/2023  Allergen Reaction Noted   Bee venom Other (See Comments) 04/30/2011   Latex Swelling 04/30/2011   Penicillins Hives 04/30/2011    Family History  Problem Relation Age of Onset   Hypertension Mother    Heart disease Mother    Cancer Father        throat   Pancreatic cancer Father    Congestive Heart Failure Brother    Heart disease Maternal Grandmother    Hypertension Maternal Grandmother    Cancer Maternal Grandfather    Hypertension Maternal Grandfather    Cancer Paternal Grandmother        breast   Breast cancer Maternal Aunt    Colon cancer Neg Hx    Inflammatory bowel disease Neg Hx     Social History   Socioeconomic History   Marital status: Divorced    Spouse name: Not on file   Number of children: 9   Years of education: 12   Highest education level: Not on file  Occupational History   Not on file  Tobacco Use   Smoking status: Never   Smokeless tobacco: Never  Vaping Use   Vaping Use: Never used  Substance and  Sexual Activity   Alcohol use: Not Currently   Drug use: No   Sexual activity: Yes    Partners: Male    Birth control/protection: Condom  Other Topics Concern   Not on file  Social History Narrative   Not on file   Social Determinants of Health   Financial Resource Strain: Not on file  Food Insecurity: Not on file  Transportation Needs: Not on file  Physical Activity: Not on file  Stress: Not on file  Social Connections: Not on file  Intimate Partner Violence: Not At Risk (03/27/2022)  Humiliation, Afraid, Rape, and Kick questionnaire    Fear of Current or Ex-Partner: No    Emotionally Abused: No    Physically Abused: No    Sexually Abused: No    Review of Systems: Gen: Denies any fever, chills, cold or flulike symptoms, presyncope, syncope. CV: Denies chest pain, heart palpitations. Resp: Denies shortness of breath, cough. GI: See HPI GU : Denies urinary burning, urinary frequency, urinary hesitancy MS: Denies joint pain. Derm: Denies rash. Psych: Denies depression, anxiety. Heme: See HPI  Physical Exam: BP 111/74 (BP Location: Right Arm, Patient Position: Sitting, Cuff Size: Normal)   Pulse 76   Temp 97.6 F (36.4 C) (Temporal)   Ht 5\' 5"  (1.651 m)   Wt 178 lb 9.6 oz (81 kg)   LMP 12/28/2022   SpO2 98%   BMI 29.72 kg/m  General:   Alert and oriented. Pleasant and cooperative. Well-nourished and well-developed.  Head:  Normocephalic and atraumatic. Eyes:  Without icterus, sclera clear and conjunctiva pink.  Ears:  Normal auditory acuity. Lungs:  Clear to auscultation bilaterally. No wheezes, rales, or rhonchi. No distress.  Heart:  S1, S2 present without murmurs appreciated.  Abdomen:  +BS, soft, and non-distended.  Mild TTP in epigastric area.  No HSM noted. No guarding or rebound. No masses appreciated.  Rectal:  Deferred  Msk:  Symmetrical without gross deformities. Normal posture. Extremities:  Without edema. Neurologic:  Alert and  oriented x4;   grossly normal neurologically. Skin:  Intact without significant lesions or rashes. Psych:  Normal mood and affect.    Assessment:  42 year old female with history of HTN, chronic back pain, heartburn, presenting today for ER follow-up of abdominal pain and constipation, found to have diffuse colitis on CT.  Also discussed globus sensation and IDA.  Abdominal pain/constipation: New onset generalized abdominal pain with constipation not responding to over-the-counter agents about 2 weeks ago.  Evaluation in the emergency room on 4/10 with no significant laboratory abnormalities.  CT with contrast showed diffuse colonic wall thickening.  She was prescribed Cipro and Flagyl x 10 days, but has only started Flagyl.  Abdominal pain improving, but continues with small, incomplete bowel movements.  Denies diarrhea.  She has seen some mucus in her stool, but no BRBPR or melena.  She has lost some weight, but with intention.  No family history of IBD or colon cancer.  Interestingly, patient reports similar symptoms about 2 years ago and had a colonoscopy at [facility] without any significant abnormalities.  States since then, she has been doing well without any chronic symptoms until this acute episode.  Etiology is unclear.  Need to rule out IBD with colonoscopy. Findings could also be secondary to daily NSAID use for the last several months. Agree with empiric course of antibiotics x 10 days.  Will have her back down to a liquid diet for the next day or 2, start MiraLAX to help with constipation, and plan for colonoscopy in about 6 weeks. Discussed strict NSAID avoidance.    Globus sensation: Reports globus sensation a couple of years ago evaluated by EGD at [facility] without significant abnormalities per patient.  States her symptoms resolved with pantoprazole 40 mg daily.  She ran out of pantoprazole for about 3 weeks and had recurrent symptoms.  Also had some heartburn during that time.  She  resumed pantoprazole 1-2 weeks ago.  Heartburn improved, but still with some globus.  No true dysphagia.  Symptoms may be secondary to heartburn/GERD  alone versus EOE.  Less consistent with esophageal web, ring, stricture. We will continue pantoprazole daily and arrange EGD in the near future.   IDA:  Patient has history of mild normocytic anemia with hemoglobin in the 11 range.  Iron saturation low and ferritin low normal at 13.6 in July 2023.  In December 2023, ferritin low at 9.  She started taking daily iron supplement and most recent labs 01/06/2023 with hemoglobin improved to 12.2.  She does report some heavy menstrual bleeding the first couple of days of her cycle.  Denies overt GI bleeding; however, she may have occult GI blood loss.  Recently found to have diffuse colonic wall thickening on CT as discussed above.  She is also been taking 800 mg of ibuprofen twice daily for several months and is at risk for peptic ulcer disease, gastritis, duodenitis.  Will plan for EGD and colonoscopy in about 6 weeks.    Plan:  Patient is to pick up already prescribed ciprofloxacin from her pharmacy and take this medication as prescribed with Flagyl which she has already started to complete her antibiotic course. Advised that she can back down to a liquid diet for the next 24 to 48 hours to see if this will help with her abdominal discomfort and constipation.  Advance to a bland diet thereafter avoiding fatty, greasy, high fibrous foods.  After completing antibiotics and clinically feeling improved, can advance diet back to normal as tolerated. Start MiraLAX 17 g twice daily.  Can increase to 3 times daily if needed. Continue pantoprazole 40 mg daily. Avoid all NSAID products. Use Tylenol as needed for pain.  No more than 3000 mg of Tylenol per 24 hours. Proceed with upper endoscopy +/- dilation + colonoscopy with propofol by Dr. Jena Gauss in about 6 weeks. The risks, benefits, and alternatives have been discussed  with the patient in detail. The patient states understanding and desires to proceed.  ASA 2 Urine pregnancy test prior Hold iron x 10 days prior Request prior colonoscopy and EGD records from [facility]. Follow-up after procedures.  Advised patient to call if her symptoms do not improve after completing her antibiotic course.   Ermalinda Memos, PA-C [facility]enter[facility] 01/08/2023

## 2023-01-08 ENCOUNTER — Ambulatory Visit (INDEPENDENT_AMBULATORY_CARE_PROVIDER_SITE_OTHER): Payer: BC Managed Care – PPO | Admitting: Gastroenterology

## 2023-01-08 ENCOUNTER — Encounter: Payer: Self-pay | Admitting: Gastroenterology

## 2023-01-08 VITALS — BP 111/74 | HR 76 | Temp 97.6°F | Ht 65.0 in | Wt 178.6 lb

## 2023-01-08 DIAGNOSIS — K5900 Constipation, unspecified: Secondary | ICD-10-CM

## 2023-01-08 DIAGNOSIS — R09A2 Foreign body sensation, throat: Secondary | ICD-10-CM

## 2023-01-08 DIAGNOSIS — K639 Disease of intestine, unspecified: Secondary | ICD-10-CM | POA: Insufficient documentation

## 2023-01-08 DIAGNOSIS — D509 Iron deficiency anemia, unspecified: Secondary | ICD-10-CM

## 2023-01-08 NOTE — Patient Instructions (Addendum)
Please pick up ciprofloxacin from your pharmacy and take this medication as prescribed with your Flagyl to complete your antibiotic course.  You can try backing down to a liquid diet for the next 24 to 48 hours to see if this will help with some of your abdominal discomfort and allow your bowels to get moving.  After that, advance to more of a bland diet limiting fatty, greasy, high fibrous foods.  After you complete your antibiotics, if you are feeling well, you can go ahead and advance your diet back to normal as tolerated.  To help with your constipation, start MiraLAX 17 g twice daily in 8 ounces of water or other noncarbonated beverage of your choice.  You can increase MiraLAX up to 3 times daily if needed.  Continue taking your pantoprazole 40 mg once daily every day.  Avoid all NSAID products including ibuprofen, Aleve, Advil, BC powders, Goody powders, and anything that says "NSAID" on the package.  You can use Tylenol as needed for pain.  No more than 3000 g of Tylenol per 24 hours.  We need to schedule you for an upper endoscopy with possible dilation of your esophagus and colonoscopy no sooner than 6 weeks from now with Dr. Jena Gauss at [facility]. You will need to hold iron for 10 days prior to your procedures.  We will follow-up with you in the office after your procedures.  Please call if your abdominal pain does not improve after your antibiotic course.  You may still have some mild residual discomfort, but overall, you should feel improved.  It was very nice to meet you today!   Ermalinda Memos, PA-C [facility]astroenter[facility]

## 2023-01-13 ENCOUNTER — Encounter: Payer: Self-pay | Admitting: Internal Medicine

## 2023-01-13 ENCOUNTER — Other Ambulatory Visit (HOSPITAL_COMMUNITY): Payer: Self-pay | Admitting: Internal Medicine

## 2023-01-13 ENCOUNTER — Encounter (HOSPITAL_COMMUNITY): Payer: Self-pay | Admitting: Internal Medicine

## 2023-01-13 ENCOUNTER — Ambulatory Visit (INDEPENDENT_AMBULATORY_CARE_PROVIDER_SITE_OTHER): Payer: BC Managed Care – PPO | Admitting: Internal Medicine

## 2023-01-13 VITALS — BP 130/83 | HR 76 | Ht 65.0 in | Wt 177.8 lb

## 2023-01-13 DIAGNOSIS — N63 Unspecified lump in unspecified breast: Secondary | ICD-10-CM

## 2023-01-13 DIAGNOSIS — E559 Vitamin D deficiency, unspecified: Secondary | ICD-10-CM | POA: Diagnosis not present

## 2023-01-13 DIAGNOSIS — N641 Fat necrosis of breast: Secondary | ICD-10-CM | POA: Diagnosis not present

## 2023-01-13 DIAGNOSIS — R002 Palpitations: Secondary | ICD-10-CM | POA: Diagnosis not present

## 2023-01-13 DIAGNOSIS — R7303 Prediabetes: Secondary | ICD-10-CM

## 2023-01-13 DIAGNOSIS — R42 Dizziness and giddiness: Secondary | ICD-10-CM

## 2023-01-13 DIAGNOSIS — K5900 Constipation, unspecified: Secondary | ICD-10-CM

## 2023-01-13 DIAGNOSIS — D508 Other iron deficiency anemias: Secondary | ICD-10-CM | POA: Diagnosis not present

## 2023-01-13 DIAGNOSIS — Z0001 Encounter for general adult medical examination with abnormal findings: Secondary | ICD-10-CM

## 2023-01-13 DIAGNOSIS — R5383 Other fatigue: Secondary | ICD-10-CM | POA: Diagnosis not present

## 2023-01-13 NOTE — Patient Instructions (Signed)
It was a pleasure to see you today.  Thank you for giving [place] the opportunity to be involved in your care.  Below is a brief recap of your visit and next steps.  We will plan to see you again in 3 months.  Summary You have established care today We will update labs R breast ultrasound ordered ECG today Follow up in 3 months

## 2023-01-13 NOTE — Assessment & Plan Note (Signed)
Presenting today to establish care.  Available records and labs been reviewed. -Repeat labs ordered today -Records from her previous PCP have been requested -We will tentatively plan for follow-up in 3 months

## 2023-01-13 NOTE — Assessment & Plan Note (Signed)
Recently evaluated by gastroenterology (4/12) for symptoms of constipation.  Her symptoms have improved with regular use of MiraLAX.  She will undergo repeat EGD/colonoscopy in June. -No medication changes today

## 2023-01-13 NOTE — Progress Notes (Signed)
New Patient Office Visit  Subjective    Patient ID: Rachel Stevens, female    DOB: 1981/03/25  Age: 42 y.o. MRN: [id number]  CC:  Chief Complaint  Patient presents with   Establish Care    HPI Rachel Stevens presents to establish care.  She is a 42 year old woman with a past medical history significant for iron deficiency anemia, GERD, prediabetes, vitamin D insufficiency, and chronic dizziness.  She has most recently received primary care from Dr. Meredith Mody with [facility] in [place].  Rachel Stevens acute concerns today are symptoms of fatigue, heart palpitations, and intermittent shortness of breath.  She believes that her symptoms are ultimately attributable to iron deficiency.  She is currently on multimodal oral iron supplementation.  She has also establish care with gastroenterology and will undergo repeat EGD and colonoscopy in June.  Rachel Stevens is currently unable to work because of chronic dizziness.  She has previously been told that she has Mnire's disease and vertigo and has been referred to neurology.  Meclizine has not alleviated her symptoms.  Her additional concern is a knot in her right breast.  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  Repeat imaging was ordered by her previous PCP and she was referred to general surgery.  She remains interested in establishing care with general surgery to have the lesion excised.  She denies tobacco, alcohol, and illicit drug use.  Her family medical history is significant for diabetes mellitus, HTN, and pancreatic cancer in her father.  Acute concerns, chronic medical conditions, and outstanding preventative care items discussed today are individually addressed in A/P below.   Outpatient Encounter Medications as of 01/13/2023  Medication Sig   cyclobenzaprine (FLEXERIL) 5 MG tablet Take 1 tablet (5 mg total) by mouth 3 (three) times daily as needed.   ferrous sulfate 325 (65 FE) MG tablet Take 325  mg by mouth daily with breakfast.   metoCLOPramide (REGLAN) 10 MG tablet Take 1 tablet (10 mg total) by mouth every 8 (eight) hours as needed for nausea.   Multiple Vitamins-Minerals (MULTIVITAMIN WITH MINERALS) tablet Take 1 tablet by mouth daily.   pantoprazole (PROTONIX) 20 MG tablet Take 1 tablet by mouth daily.   valACYclovir (VALTREX) 500 MG tablet Take 500 mg by mouth daily.   [DISCONTINUED] ciprofloxacin (CIPRO) 500 MG tablet Take 1 tablet (500 mg total) by mouth 2 (two) times daily.   [DISCONTINUED] metroNIDAZOLE (FLAGYL) 500 MG tablet Take 1 tablet (500 mg total) by mouth 3 (three) times daily.   [DISCONTINUED] gabapentin (NEURONTIN) 300 MG capsule Take 1 capsule (300 mg total) by mouth 3 (three) times daily. (Patient not taking: Reported on 01/08/2023)   No facility-administered encounter medications on file as of 01/13/2023.    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   Hypertension    Irregular periods 01/31/2015   Meniere disease    Missed period 11/01/2014   Patient desires pregnancy 11/01/2014   Pelvic floor tension    Vaginal discharge 11/01/2014   Vulvar pain    Weight gain 11/01/2014    Past Surgical History:  Procedure Laterality Date   BREAST REDUCTION SURGERY     REDUCTION MAMMAPLASTY Right     Family History  Problem Relation Age of Onset  Hypertension Mother    Heart disease Mother    Cancer Father        throat   Pancreatic cancer Father    Congestive Heart Failure Brother    Heart disease Maternal Grandmother    Hypertension Maternal Grandmother    Cancer Maternal Grandfather    Hypertension Maternal Grandfather    Cancer Paternal Grandmother        breast   Breast cancer Maternal Aunt    Colon cancer Neg Hx    Inflammatory bowel disease Neg Hx     Social History   Socioeconomic History   Marital  status: Divorced    Spouse name: Not on file   Number of children: 9   Years of education: 12   Highest education level: Not on file  Occupational History   Not on file  Tobacco Use   Smoking status: Never   Smokeless tobacco: Never  Vaping Use   Vaping Use: Never used  Substance and Sexual Activity   Alcohol use: Not Currently   Drug use: No   Sexual activity: Yes    Partners: Male    Birth control/protection: Condom  Other Topics Concern   Not on file  Social History Narrative   Not on file   Social Determinants of Health   Financial Resource Strain: Not on file  Food Insecurity: Not on file  Transportation Needs: Not on file  Physical Activity: Not on file  Stress: Not on file  Social Connections: Not on file  Intimate Partner Violence: Not At Risk (03/27/2022)   Humiliation, Afraid, Rape, and Kick questionnaire    Fear of Current or Ex-Partner: No    Emotionally Abused: No    Physically Abused: No    Sexually Abused: No   Review of Systems  Constitutional:  Positive for malaise/fatigue.  Respiratory:  Positive for shortness of breath.   Cardiovascular:  Positive for palpitations.  Neurological:  Positive for dizziness.  All other systems reviewed and are negative.  Objective    BP 130/83   Pulse 76   Ht 5\' 5"  (1.651 m)   Wt 177 lb 12.8 oz (80.6 kg)   LMP 12/28/2022   SpO2 94%   BMI 29.59 kg/m   Physical Exam Vitals reviewed.  Constitutional:      General: She is not in acute distress.    Appearance: Normal appearance. She is not toxic-appearing.  HENT:     Head: Normocephalic and atraumatic.     Right Ear: External ear normal.     Left Ear: External ear normal.     Nose: Nose normal. No congestion or rhinorrhea.     Mouth/Throat:     Mouth: Mucous membranes are moist.     Pharynx: Oropharynx is clear. No oropharyngeal exudate or posterior oropharyngeal erythema.  Eyes:     General: No scleral icterus.    Extraocular Movements: Extraocular  movements intact.     Conjunctiva/sclera: Conjunctivae normal.     Pupils: Pupils are equal, round, and reactive to light.  Cardiovascular:     Rate and Rhythm: Normal rate and regular rhythm.     Pulses: Normal pulses.     Heart sounds: Normal heart sounds. No murmur heard.    No friction rub. No gallop.  Pulmonary:     Effort: Pulmonary effort is normal.     Breath sounds: Normal breath sounds. No wheezing, rhonchi or rales.  Abdominal:     General: Abdomen is flat. Bowel sounds are normal.  There is no distension.     Palpations: Abdomen is soft.     Tenderness: There is no abdominal tenderness.  Musculoskeletal:        General: No swelling. Normal range of motion.     Cervical back: Normal range of motion.     Right lower leg: No edema.     Left lower leg: No edema.  Lymphadenopathy:     Cervical: No cervical adenopathy.  Skin:    General: Skin is warm and dry.     Capillary Refill: Capillary refill takes less than 2 seconds.     Coloration: Skin is not jaundiced.  Neurological:     General: No focal deficit present.     Mental Status: She is alert and oriented to person, place, and time.  Psychiatric:        Mood and Affect: Mood normal.        Behavior: Behavior normal.    Assessment & Plan:   Problem List Items Addressed This Visit       Dizziness of unknown cause    Chronic issue without clear etiology.  She has previously been referred to neurology for evaluation of Mnire's disease vs vertigo.  Meclizine has been trialed but has been ineffective.  She ultimately believes her symptoms are related to iron deficiency as her symptoms seem to exacerbate when she is iron deficient.  She endorses intermittent palpitations and shortness of breath as well.  ECG obtained today does not demonstrate any underlying arrhythmia. -Cardiology referral placed for further evaluation -Addressing iron deficiency anemia as otherwise documented      Fatigue    Chronic issue.  Likely  related to iron deficiency.  No additional metabolic etiologies have been identified on recent labs.  Discussed that if her symptoms persist despite resolution of iron deficiency, may ultimately be related to her mental health.  PHQ-9 score 4 today. -Repeat iron studies ordered today      Vitamin D insufficiency    Noted on recent labs.  She is currently on vitamin D supplementation (2000 IUs daily). -Repeat vitamin D level ordered today      Breast mass in female    Right breast mass.  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  She had recently discussed a referral to general surgery with her PCP for excision of the mass.  She would like for me to coordinate this again today. -Right breast US ordered today -General surgery referral placed      Constipation    Recently evaluated by gastroenterology (4/12) for symptoms of constipation.  Her symptoms have improved with regular use of MiraLAX.  She will undergo repeat EGD/colonoscopy in June. -No medication changes today      Iron deficiency anemia    Previously documented history of iron deficiency anemia.  Iron studies from March were consistent with iron deficiency.  She is currently on multiple forms of oral iron supplementation.  She endorses symptoms of fatigue, intermittent heart palpitations and shortness of breath, as well as dizziness.  Ultimately she believes that the symptoms are all attributable to iron deficiency. -Repeat iron studies ordered today.  If she remains iron deficient, consider iron infusions. -EGD/colonoscopy planned for June      Heart palpitations    She endorses a recent history of heart palpitations with associated symptoms of shortness of breath and dizziness.  She ultimately believes her symptoms are related to iron deficiency.  ECG obtained today shows normal sinus  rhythm with no significant findings. -Cardiology referral placed today for further evaluation given the persistence  of her symptoms without clear etiology.      Prediabetes    Noted on recent labs.  She has made significant dietary changes in light of this result.      Encounter for general adult medical examination with abnormal findings    Presenting today to establish care.  Available records and labs been reviewed. -Repeat labs ordered today -Records from her previous PCP have been requested -We will tentatively plan for follow-up in 3 months      Return in about 3 months (around 04/14/2023).   Billie Lade, MD

## 2023-01-13 NOTE — Assessment & Plan Note (Signed)
Chronic issue.  Likely related to iron deficiency.  No additional metabolic etiologies have been identified on recent labs.  Discussed that if her symptoms persist despite resolution of iron deficiency, may ultimately be related to her mental health.  PHQ-9 score 4 today. -Repeat iron studies ordered today

## 2023-01-13 NOTE — Assessment & Plan Note (Signed)
Right breast mass.  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  She had recently discussed a referral to general surgery with her PCP for excision of the mass.  She would like for me to coordinate this again today. -Right breast US ordered today -General surgery referral placed

## 2023-01-13 NOTE — Assessment & Plan Note (Addendum)
She endorses a recent history of heart palpitations with associated symptoms of shortness of breath and dizziness.  She ultimately believes her symptoms are related to iron deficiency.  ECG obtained today shows normal sinus rhythm with no significant findings. -Cardiology referral placed today for further evaluation given the persistence of her symptoms without clear etiology.

## 2023-01-13 NOTE — Assessment & Plan Note (Signed)
Noted on recent labs.  She is currently on vitamin D supplementation (2000 IUs daily). -Repeat vitamin D level ordered today

## 2023-01-13 NOTE — Assessment & Plan Note (Signed)
Previously documented history of iron deficiency anemia.  Iron studies from March were consistent with iron deficiency.  She is currently on multiple forms of oral iron supplementation.  She endorses symptoms of fatigue, intermittent heart palpitations and shortness of breath, as well as dizziness.  Ultimately she believes that the symptoms are all attributable to iron deficiency. -Repeat iron studies ordered today.  If she remains iron deficient, consider iron infusions. -EGD/colonoscopy planned for June

## 2023-01-13 NOTE — Assessment & Plan Note (Signed)
Noted on recent labs.  She has made significant dietary changes in light of this result.

## 2023-01-13 NOTE — Assessment & Plan Note (Signed)
Chronic issue without clear etiology.  She has previously been referred to neurology for evaluation of Mnire's disease vs vertigo.  Meclizine has been trialed but has been ineffective.  She ultimately believes her symptoms are related to iron deficiency as her symptoms seem to exacerbate when she is iron deficient.  She endorses intermittent palpitations and shortness of breath as well.  ECG obtained today does not demonstrate any underlying arrhythmia. -Cardiology referral placed for further evaluation -Addressing iron deficiency anemia as otherwise documented

## 2023-01-14 ENCOUNTER — Other Ambulatory Visit: Payer: Self-pay | Admitting: Internal Medicine

## 2023-01-14 DIAGNOSIS — E039 Hypothyroidism, unspecified: Secondary | ICD-10-CM

## 2023-01-14 LAB — LIPID PANEL
Chol/HDL Ratio: 3 ratio (ref 0.0–4.4)
Cholesterol, Total: 162 mg/dL (ref 100–199)
HDL: 54 mg/dL (ref >39)
LDL Chol Calc (NIH): 88 mg/dL (ref 0–99)
Triglycerides: 109 mg/dL (ref 0–149)
VLDL Cholesterol Cal: 20 mg/dL (ref 5–40)

## 2023-01-14 LAB — CMP14+EGFR
ALT: 18 IU/L (ref 0–32)
AST: 23 IU/L (ref 0–40)
Albumin/Globulin Ratio: 1.7 (ref 1.2–2.2)
Albumin: 4.3 g/dL (ref 3.9–4.9)
Alkaline Phosphatase: 78 IU/L (ref 44–121)
BUN/Creatinine Ratio: 10 (ref 9–23)
BUN: 8 mg/dL (ref 6–24)
Bilirubin Total: <0.2 mg/dL (ref 0.0–1.2)
CO2: 21 mmol/L (ref 20–29)
Calcium: 9.4 mg/dL (ref 8.7–10.2)
Chloride: 104 mmol/L (ref 96–106)
Creatinine, Ser: 0.81 mg/dL (ref 0.57–1.00)
Globulin, Total: 2.6 g/dL (ref 1.5–4.5)
Glucose: 86 mg/dL (ref 70–99)
Potassium: 4.1 mmol/L (ref 3.5–5.2)
Sodium: 138 mmol/L (ref 134–144)
Total Protein: 6.9 g/dL (ref 6.0–8.5)
eGFR: 93 mL/min/{1.73_m2} (ref >59)

## 2023-01-14 LAB — CBC WITH DIFFERENTIAL/PLATELET
Basophils Absolute: 0 10*3/uL (ref 0.0–0.2)
Basos: 0 %
EOS (ABSOLUTE): 0.1 10*3/uL (ref 0.0–0.4)
Eos: 1 %
Hematocrit: 39.2 % (ref 34.0–46.6)
Hemoglobin: 12.7 g/dL (ref 11.1–15.9)
Immature Grans (Abs): 0.1 10*3/uL (ref 0.0–0.1)
Immature Granulocytes: 1 %
Lymphocytes Absolute: 3 10*3/uL (ref 0.7–3.1)
Lymphs: 27 %
MCH: 28.6 pg (ref 26.6–33.0)
MCHC: 32.4 g/dL (ref 31.5–35.7)
MCV: 88 fL (ref 79–97)
Monocytes Absolute: 0.7 10*3/uL (ref 0.1–0.9)
Monocytes: 6 %
Neutrophils Absolute: 7.2 10*3/uL — ABNORMAL HIGH (ref 1.4–7.0)
Neutrophils: 65 %
Platelets: 277 10*3/uL (ref 150–450)
RBC: 4.44 x10E6/uL (ref 3.77–5.28)
RDW: 13.1 % (ref 11.7–15.4)
WBC: 11 10*3/uL — ABNORMAL HIGH (ref 3.4–10.8)

## 2023-01-14 LAB — IRON,TIBC AND FERRITIN PANEL
Ferritin: 22 ng/mL (ref 15–150)
Iron Saturation: 29 % (ref 15–55)
Iron: 86 ug/dL (ref 27–159)
Total Iron Binding Capacity: 292 ug/dL (ref 250–450)
UIBC: 206 ug/dL (ref 131–425)

## 2023-01-14 LAB — TSH+FREE T4
Free T4: 0.78 ng/dL — ABNORMAL LOW (ref 0.82–1.77)
TSH: 3.35 u[IU]/mL (ref 0.450–4.500)

## 2023-01-14 LAB — VITAMIN D 25 HYDROXY (VIT D DEFICIENCY, FRACTURES): Vit D, 25-Hydroxy: 31.9 ng/mL (ref 30.0–100.0)

## 2023-01-17 ENCOUNTER — Encounter: Payer: Self-pay | Admitting: Gastroenterology

## 2023-01-17 ENCOUNTER — Telehealth: Payer: Self-pay | Admitting: Gastroenterology

## 2023-01-17 NOTE — Telephone Encounter (Signed)
Received EGD and colonoscopy report from [facility].  Colonoscopy 05/28/2020: Normal examined colonoscopy and normal examined terminal ileum.  EGD 03/29/2020 with normal exam.

## 2023-01-20 ENCOUNTER — Encounter: Payer: Self-pay | Admitting: Internal Medicine

## 2023-01-20 ENCOUNTER — Other Ambulatory Visit: Payer: Self-pay | Admitting: Internal Medicine

## 2023-01-20 ENCOUNTER — Ambulatory Visit
Admission: RE | Admit: 2023-01-20 | Discharge: 2023-01-20 | Disposition: A | Discharge status: Home / Self Care | Payer: BC Managed Care – PPO | Source: Ambulatory Visit | Attending: Family Medicine | Admitting: Family Medicine

## 2023-01-20 VITALS — BP 128/80 | HR 74 | Temp 98.0°F | Resp 20

## 2023-01-20 DIAGNOSIS — N898 Other specified noninflammatory disorders of vagina: Secondary | ICD-10-CM

## 2023-01-20 DIAGNOSIS — B3731 Acute candidiasis of vulva and vagina: Secondary | ICD-10-CM

## 2023-01-20 MED ORDER — METRONIDAZOLE 0.75 % VA GEL: 1.0000 | Freq: Every day | VAGINAL | 0 refills | Status: AC

## 2023-01-20 MED ORDER — FLUCONAZOLE 150 MG PO TABS
150.0000 mg | ORAL_TABLET | Freq: Once | ORAL | 0 refills | Status: AC
Start: 2023-01-20 — End: 2023-01-20

## 2023-01-20 NOTE — Discharge Instructions (Signed)
We have sent testing for sexually transmitted infections. We will notify you of any positive results once they are received. If required, we will prescribe any medications you might need.  Please refrain from all sexual activity for at least the next seven days.  

## 2023-01-20 NOTE — ED Provider Notes (Signed)
[facility]   [id number] 01/20/23 Arrival Time: 1637  ASSESSMENT & PLAN:  1. Vaginal itching   2. Vaginal discharge    Has Rx Difluecan. Meds ordered this encounter  Medications   metroNIDAZOLE (METROGEL) 0.75 % vaginal gel    Sig: Place 1 Applicatorful vaginally at bedtime for 5 days.    Dispense:  50 g    Refill:  0      Discharge Instructions      We have sent testing for sexually transmitted infections. We will notify you of any positive results once they are received. If required, we will prescribe any medications you might need.  Please refrain from all sexual activity for at least the next seven days.     Without s/s of PID.  Labs Reviewed  CERVICOVAGINAL ANCILLARY ONLY   Will notify of any positive results.  Reviewed expectations re: course of current medical issues. Questions answered. Outlined signs and symptoms indicating need for more acute intervention. Patient verbalized understanding. After Visit Summary given.   SUBJECTIVE:  Rachel Stevens is a 42 y.o. female who presents with complaint of vaginal discharge. Pt reports she has a burning sensation and itching x 1 week. Has been on an antibiotic and thinks that it is coming from that. Has been prescribed diflucan but hasn't taken it yet as she wants to get swabbed. H/O BV with similar symptoms also.  Patient's last menstrual period was 12/28/2022.   OBJECTIVE:  Vitals:   01/20/23 1710  BP: 128/80  Pulse: 74  Resp: 20  Temp: 98 F (36.7 C)  TempSrc: Oral  SpO2: 100%     General appearance: alert, cooperative, appears stated age and no distress Lungs: unlabored respirations; speaks full sentences without difficulty Back: no CVA tenderness; FROM at waist Abdomen: soft, non-tender GU: deferred Skin: warm and dry Psychological: alert and cooperative; normal mood and affect.    Labs Reviewed  CERVICOVAGINAL ANCILLARY ONLY    Allergies  Allergen Reactions   Bee Venom  Other (See Comments)    blisters   Latex Swelling   Penicillins Hives    .Has patient had a PCN reaction causing immediate rash, facial/tongue/throat swelling, SOB or lightheadedness with hypotension: Yes Has patient had a PCN reaction causing severe rash involving mucus membranes or skin necrosis: No Has patient had a PCN reaction that required hospitalization: No Has patient had a PCN reaction occurring within the last 10 years: No If all of the above answers are "NO", then may proceed with Cephalosporin use.     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   Hypertension    Irregular periods 01/31/2015   Meniere disease    Missed period 11/01/2014   Patient desires pregnancy 11/01/2014   Pelvic floor tension    Vaginal discharge 11/01/2014   Vulvar pain    Weight gain 11/01/2014   Family History  Problem Relation Age of Onset   Hypertension Mother    Heart disease Mother    Cancer Father        throat   Pancreatic cancer Father    Congestive Heart Failure Brother    Heart disease Maternal Grandmother    Hypertension Maternal Grandmother    Cancer Maternal Grandfather    Hypertension Maternal Grandfather    Cancer Paternal Grandmother  breast   Breast cancer Maternal Aunt    Colon cancer Neg Hx    Inflammatory bowel disease Neg Hx    Social History   Socioeconomic History   Marital status: Divorced    Spouse name: Not on file   Number of children: 9   Years of education: 12   Highest education level: Not on file  Occupational History   Not on file  Tobacco Use   Smoking status: Never   Smokeless tobacco: Never  Vaping Use   Vaping Use: Never used  Substance and Sexual Activity   Alcohol use: Not Currently   Drug use: No   Sexual activity: Yes    Partners: Male    Birth control/protection: Condom  Other Topics  Concern   Not on file  Social History Narrative   Not on file   Social Determinants of Health   Financial Resource Strain: Not on file  Food Insecurity: Not on file  Transportation Needs: Not on file  Physical Activity: Not on file  Stress: Not on file  Social Connections: Not on file  Intimate Partner Violence: Not At Risk (03/27/2022)   Humiliation, Afraid, Rape, and Kick questionnaire    Fear of Current or Ex-Partner: No    Emotionally Abused: No    Physically Abused: No    Sexually Abused: No           Mardella Layman, MD 01/20/23 1807

## 2023-01-20 NOTE — ED Triage Notes (Signed)
Pt reports she has a burning sensation and itching xx 1 week. Has been on an antibiotic and thinks that it is coming from that. Has been prescribed diflucan but hasn't taken it yet as she wants to get swabbed.

## 2023-01-21 LAB — CERVICOVAGINAL ANCILLARY ONLY
Bacterial Vaginitis (gardnerella): NEGATIVE
Candida Glabrata: NEGATIVE
Candida Vaginitis: NEGATIVE
Chlamydia: NEGATIVE
Comment: NEGATIVE
Comment: NEGATIVE
Comment: NEGATIVE
Comment: NEGATIVE
Comment: NEGATIVE
Comment: NORMAL
Neisseria Gonorrhea: NEGATIVE
Trichomonas: NEGATIVE

## 2023-01-26 ENCOUNTER — Telehealth: Payer: Self-pay | Admitting: *Deleted

## 2023-01-26 NOTE — Telephone Encounter (Signed)
[facility]  TCS/EGD/+/-ED w/Dr.Rourk, ASA 2,

## 2023-01-28 ENCOUNTER — Other Ambulatory Visit (HOSPITAL_COMMUNITY): Payer: Self-pay | Admitting: Internal Medicine

## 2023-01-28 ENCOUNTER — Ambulatory Visit (HOSPITAL_COMMUNITY)
Admission: RE | Admit: 2023-01-28 | Discharge: 2023-01-28 | Disposition: A | Discharge status: Home / Self Care | Payer: BC Managed Care – PPO | Source: Ambulatory Visit | Attending: Internal Medicine | Admitting: Internal Medicine

## 2023-01-28 ENCOUNTER — Inpatient Hospital Stay
Admission: RE | Admit: 2023-01-28 | Discharge: 2023-01-28 | Disposition: A | Discharge status: Home / Self Care | Payer: Self-pay | Source: Ambulatory Visit | Attending: Internal Medicine | Admitting: Internal Medicine

## 2023-01-28 ENCOUNTER — Encounter (HOSPITAL_COMMUNITY): Payer: Self-pay

## 2023-01-28 ENCOUNTER — Other Ambulatory Visit: Payer: Self-pay | Admitting: Internal Medicine

## 2023-01-28 DIAGNOSIS — N63 Unspecified lump in unspecified breast: Secondary | ICD-10-CM

## 2023-01-28 DIAGNOSIS — N641 Fat necrosis of breast: Secondary | ICD-10-CM | POA: Diagnosis present

## 2023-01-28 DIAGNOSIS — R5383 Other fatigue: Secondary | ICD-10-CM

## 2023-01-28 DIAGNOSIS — R7303 Prediabetes: Secondary | ICD-10-CM

## 2023-01-28 DIAGNOSIS — R42 Dizziness and giddiness: Secondary | ICD-10-CM

## 2023-01-28 DIAGNOSIS — K5900 Constipation, unspecified: Secondary | ICD-10-CM

## 2023-01-28 DIAGNOSIS — Z0001 Encounter for general adult medical examination with abnormal findings: Secondary | ICD-10-CM

## 2023-01-28 DIAGNOSIS — D508 Other iron deficiency anemias: Secondary | ICD-10-CM

## 2023-01-28 DIAGNOSIS — R002 Palpitations: Secondary | ICD-10-CM

## 2023-01-28 DIAGNOSIS — E559 Vitamin D deficiency, unspecified: Secondary | ICD-10-CM

## 2023-01-29 ENCOUNTER — Ambulatory Visit: Payer: BC Managed Care – PPO | Admitting: Internal Medicine

## 2023-02-02 ENCOUNTER — Ambulatory Visit (INDEPENDENT_AMBULATORY_CARE_PROVIDER_SITE_OTHER): Payer: BC Managed Care – PPO | Admitting: Surgery

## 2023-02-02 ENCOUNTER — Encounter: Payer: Self-pay | Admitting: Surgery

## 2023-02-02 VITALS — BP 118/84 | HR 83 | Temp 98.4°F | Resp 14 | Ht 65.0 in | Wt 179.0 lb

## 2023-02-02 DIAGNOSIS — N641 Fat necrosis of breast: Secondary | ICD-10-CM | POA: Diagnosis not present

## 2023-02-02 DIAGNOSIS — N644 Mastodynia: Secondary | ICD-10-CM

## 2023-02-02 NOTE — Progress Notes (Unsigned)
[facility] History and Physical  Reason for Referral:*** Referring Physician: ***  Chief Complaint   New Patient (Initial Visit)     Rachel Stevens is a 42 y.o. female.  HPI: ***.  The *** started *** and has had a duration of ***.  It is associated with ***.  The *** is improved with ***, and is made worse with ***.    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   ESOPHAGOGASTRODUODENOSCOPY  03/29/2020   [facility]; normal exam    Family History  Problem Relation Age of Onset   Hypertension Mother    Heart disease Mother    Cancer Father        throat   Pancreatic cancer Father    Congestive Heart Failure Brother    Heart disease Maternal Grandmother    Hypertension Maternal Grandmother    Cancer Maternal Grandfather    Hypertension Maternal Grandfather    Cancer Paternal Grandmother        breast   Breast cancer Maternal Aunt    Colon cancer Neg Hx    Inflammatory bowel disease Neg Hx     Social History   Tobacco Use   Smoking status: Never   Smokeless tobacco: Never  Vaping Use   Vaping Use: Never used  Substance Use Topics   Alcohol use: Not Currently   Drug use: No    Medications: I have reviewed the patient's current medications. Allergies as of 02/02/2023       Reactions   Bee Venom Other (See Comments)   blisters   Latex Swelling   Penicillins  Hives   .Has patient had a PCN reaction causing immediate rash, facial/tongue/throat swelling, SOB or lightheadedness with hypotension: Yes Has patient had a PCN reaction causing severe rash involving mucus membranes or skin necrosis: No Has patient had a PCN reaction that required hospitalization: No Has patient had a PCN reaction occurring within the last 10 years: No If all of the above answers are "NO", then may proceed with Cephalosporin use.        Medication List        Accurate as of Feb 02, 2023 10:54 AM. If you have any questions, ask your nurse or doctor.          STOP taking these medications    cyclobenzaprine 5 MG tablet Commonly known as: FLEXERIL Stopped by: Felcia Huebert A Chyler Creely, DO   metoCLOPramide 10 MG tablet Commonly known as: REGLAN Stopped by: Keva Darty A Jamayah Myszka, DO   valACYclovir 500 MG tablet Commonly known as: VALTREX Stopped by: Irja Wheless A Cristin Penaflor, DO       TAKE these medications    ferrous sulfate 325 (65 FE) MG tablet Take 325 mg by mouth daily with breakfast.   multivitamin with minerals  tablet Take 1 tablet by mouth daily.   pantoprazole 20 MG tablet Commonly known as: PROTONIX Take 1 tablet by mouth daily.   Vitamin D2 50 MCG (2000 UT) Tabs Take by mouth.         ROS:  Constitutional: negative for chills, fatigue, and fevers Eyes: negative for visual disturbance and pain Ears, nose, mouth, throat, and face: negative for ear drainage, sore throat, and sinus problems Respiratory: negative for cough, wheezing, and shortness of breath Cardiovascular: negative for chest pain and palpitations Gastrointestinal: negative for abdominal pain, nausea, reflux symptoms, and vomiting Genitourinary:negative for dysuria and frequency Integument/breast: negative for dryness and rash Hematologic/lymphatic: negative for bleeding and lymphadenopathy Musculoskeletal:negative for back pain and neck pain Neurological: negative for  dizziness and tremors Endocrine: negative for temperature intolerance  Blood pressure 118/84, pulse 83, temperature 98.4 F (36.9 C), temperature source Oral, resp. rate 14, height 5\' 5"  (1.651 m), weight 179 lb (81.2 kg), last menstrual period 01/27/2023, SpO2 95 %. Physical Exam  Results: Bilateral diagnostic mammogram (01/28/2023): IMPRESSION: Palpable area of concern in the right breast corresponds with a stable dystrophic calcification. No mammographic evidence of malignancy in either breast.   RECOMMENDATION: Screening mammogram in one year.(Code:SM-B-01Y)   I have discussed the findings and recommendations with the patient. If applicable, a reminder letter will be sent to the patient regarding the next appointment.   BI-RADS CATEGORY  2: Benign.  Left breast ultrasound (01/28/2023): IMPRESSION: Palpable area of concern in the right breast corresponds with a stable dystrophic calcification. No mammographic evidence of malignancy in either breast.   RECOMMENDATION: Screening mammogram in one year.(Code:SM-B-01Y)   I have discussed the findings and recommendations with the patient. If applicable, a reminder letter will be sent to the patient regarding the next appointment.   BI-RADS CATEGORY  2: Benign.   Assessment & Plan:  Rachel Stevens is a 42 y.o. female with ***  -*** -The risk and benefits of *** were discussed including but not limited to ***.  After careful consideration, Rachel Stevens has decided to ***.  -Follow up ***  All questions were answered to the satisfaction of the patient.  Theophilus Kinds, DO [facility] [address] [place], [place] [address] [phone] (office)

## 2023-02-11 ENCOUNTER — Encounter: Payer: Self-pay | Admitting: *Deleted

## 2023-02-11 NOTE — Telephone Encounter (Signed)
Mailed letter °

## 2023-02-19 ENCOUNTER — Ambulatory Visit: Payer: BC Managed Care – PPO

## 2023-02-25 ENCOUNTER — Ambulatory Visit (HOSPITAL_BASED_OUTPATIENT_CLINIC_OR_DEPARTMENT_OTHER): Payer: BC Managed Care – PPO | Admitting: Cardiology

## 2023-02-25 ENCOUNTER — Encounter: Payer: Self-pay | Admitting: Internal Medicine

## 2023-02-25 ENCOUNTER — Ambulatory Visit (INDEPENDENT_AMBULATORY_CARE_PROVIDER_SITE_OTHER): Payer: BC Managed Care – PPO | Admitting: Internal Medicine

## 2023-02-25 VITALS — BP 127/83 | HR 75 | Ht 66.0 in | Wt 183.4 lb

## 2023-02-25 DIAGNOSIS — L299 Pruritus, unspecified: Secondary | ICD-10-CM | POA: Diagnosis not present

## 2023-02-25 DIAGNOSIS — B3731 Acute candidiasis of vulva and vagina: Secondary | ICD-10-CM | POA: Diagnosis not present

## 2023-02-25 DIAGNOSIS — N898 Other specified noninflammatory disorders of vagina: Secondary | ICD-10-CM

## 2023-02-25 MED ORDER — FLUCONAZOLE 150 MG PO TABS
150.0000 mg | ORAL_TABLET | Freq: Once | ORAL | 0 refills | Status: AC
Start: 2023-02-25 — End: 2023-02-25

## 2023-02-25 MED ORDER — HYDROXYZINE PAMOATE 25 MG PO CAPS
25.0000 mg | ORAL_CAPSULE | Freq: Three times a day (TID) | ORAL | 0 refills | Status: AC | PRN
Start: 2023-02-25 — End: ?

## 2023-02-25 NOTE — Patient Instructions (Signed)
It was a pleasure to see you today.  Thank you for giving [place] the opportunity to be involved in your care.  Below is a brief recap of your visit and next steps.  We will plan to see you again in 6 weeks.   Summary Hydroxyzine prescribed today for itch relief Fluconazole ordered for yeast infection Vaginal swab ordered today Follow up in 6 weeks

## 2023-02-25 NOTE — Progress Notes (Signed)
Acute Office Visit  Subjective:     Patient ID: Rachel Stevens, female    DOB: 1981-06-03, 42 y.o.   MRN: [id number]  Chief Complaint  Patient presents with   Rash    Itching all over. Started 02/23/23.   Rachel Stevens presents today for an acute visit endorsing a 2-day history of generalized itching.  She has tried over-the-counter medications to treat her symptoms without sustained relief.  She has not changed laundry detergents, soaps, shampoos, conditioners, deodorants, or lotions recently.  There was nothing unusual that occurred around the onset of her symptoms.  She has not appreciated any rash associated with the itching.  She additionally endorses vaginal irritation and white vaginal discharge.  Denies dysuria or increased urinary frequency.  No history of recurrent yeast infections.  Review of Systems  Genitourinary:        Vaginal irritation with white discharge  Skin:  Positive for itching (Generalized itching). Negative for rash.  All other systems reviewed and are negative.     Objective:    BP 127/83   Pulse 75   Ht 5\' 6"  (1.676 m)   Wt 183 lb 6.4 oz (83.2 kg)   LMP 01/27/2023   SpO2 98%   BMI 29.60 kg/m   Physical Exam Vitals reviewed.  Constitutional:      General: She is not in acute distress.    Appearance: Normal appearance. She is not toxic-appearing.  HENT:     Head: Normocephalic and atraumatic.     Right Ear: External ear normal.     Left Ear: External ear normal.     Nose: Nose normal. No congestion or rhinorrhea.     Mouth/Throat:     Mouth: Mucous membranes are moist.     Pharynx: Oropharynx is clear. No oropharyngeal exudate or posterior oropharyngeal erythema.  Eyes:     General: No scleral icterus.    Extraocular Movements: Extraocular movements intact.     Conjunctiva/sclera: Conjunctivae normal.     Pupils: Pupils are equal, round, and reactive to light.  Cardiovascular:     Rate and Rhythm: Normal rate and regular rhythm.     Pulses:  Normal pulses.     Heart sounds: Normal heart sounds. No murmur heard.    No friction rub. No gallop.  Pulmonary:     Effort: Pulmonary effort is normal.     Breath sounds: Normal breath sounds. No wheezing, rhonchi or rales.  Abdominal:     General: Abdomen is flat. Bowel sounds are normal. There is no distension.     Palpations: Abdomen is soft.     Tenderness: There is no abdominal tenderness.  Musculoskeletal:        General: No swelling. Normal range of motion.     Cervical back: Normal range of motion.     Right lower leg: No edema.     Left lower leg: No edema.  Lymphadenopathy:     Cervical: No cervical adenopathy.  Skin:    General: Skin is warm and dry.     Capillary Refill: Capillary refill takes less than 2 seconds.     Coloration: Skin is not jaundiced.  Neurological:     General: No focal deficit present.     Mental Status: She is alert and oriented to person, place, and time.  Psychiatric:        Mood and Affect: Mood normal.        Behavior: Behavior normal.  Assessment & Plan:   Problem List Items Addressed This Visit       Itching    Presenting today for an acute visit endorsing a 2-day history of generalized itching.  No rashes identified on exam.  As noted above, she has not used any new products recently. -Hydroxyzine 25 mg every 8 hours as needed for itch relief prescribed today -She was instructed to return to care if her symptoms worsen or fail to improve      Vaginal irritation    She additionally endorses a recent history of vaginal irritation with white discharge. -NuSwab performed today.  Will follow results. -Empirically treat for Candida vaginitis.  Diflucan 150 mg once prescribed today      Meds ordered this encounter  Medications   hydrOXYzine (VISTARIL) 25 MG capsule    Sig: Take 1 capsule (25 mg total) by mouth every 8 (eight) hours as needed.    Dispense:  30 capsule    Refill:  0   fluconazole (DIFLUCAN) 150 MG tablet     Sig: Take 1 tablet (150 mg total) by mouth once for 1 dose.    Dispense:  1 tablet    Refill:  0    Return in about 6 weeks (around 04/08/2023).  Billie Lade, MD

## 2023-02-26 LAB — CBC WITH DIFFERENTIAL/PLATELET
Basophils Absolute: 0 10*3/uL (ref 0.0–0.2)
Basos: 0 %
EOS (ABSOLUTE): 0.1 10*3/uL (ref 0.0–0.4)
Eos: 1 %
Hematocrit: 33.8 % — ABNORMAL LOW (ref 34.0–46.6)
Hemoglobin: 11.2 g/dL (ref 11.1–15.9)
Immature Grans (Abs): 0 10*3/uL (ref 0.0–0.1)
Immature Granulocytes: 0 %
Lymphocytes Absolute: 2.5 10*3/uL (ref 0.7–3.1)
Lymphs: 30 %
MCH: 28.6 pg (ref 26.6–33.0)
MCHC: 33.1 g/dL (ref 31.5–35.7)
MCV: 86 fL (ref 79–97)
Monocytes Absolute: 0.6 10*3/uL (ref 0.1–0.9)
Monocytes: 7 %
Neutrophils Absolute: 5.1 10*3/uL (ref 1.4–7.0)
Neutrophils: 62 %
Platelets: 247 10*3/uL (ref 150–450)
RBC: 3.92 x10E6/uL (ref 3.77–5.28)
RDW: 12.5 % (ref 11.7–15.4)
WBC: 8.3 10*3/uL (ref 3.4–10.8)

## 2023-02-26 LAB — TSH+T4F+T3FREE
Free T4: 0.84 ng/dL (ref 0.82–1.77)
T3, Free: 2.6 pg/mL (ref 2.0–4.4)
TSH: 1.56 u[IU]/mL (ref 0.450–4.500)

## 2023-02-26 LAB — PROLACTIN: Prolactin: 14.4 ng/mL (ref 4.8–33.4)

## 2023-02-27 ENCOUNTER — Encounter: Payer: Self-pay | Admitting: Internal Medicine

## 2023-02-28 LAB — NUSWAB VAGINITIS PLUS (VG+)
Atopobium vaginae: HIGH Score — AB
BVAB 2: HIGH Score — AB
Candida albicans, NAA: NEGATIVE
Candida glabrata, NAA: NEGATIVE
Chlamydia trachomatis, NAA: NEGATIVE
Megasphaera 1: HIGH Score — AB
Neisseria gonorrhoeae, NAA: NEGATIVE
Trich vag by NAA: NEGATIVE

## 2023-03-01 ENCOUNTER — Other Ambulatory Visit: Payer: Self-pay | Admitting: Internal Medicine

## 2023-03-01 DIAGNOSIS — B9689 Other specified bacterial agents as the cause of diseases classified elsewhere: Secondary | ICD-10-CM

## 2023-03-01 MED ORDER — METRONIDAZOLE 500 MG PO TABS
500.0000 mg | ORAL_TABLET | Freq: Two times a day (BID) | ORAL | 0 refills | Status: AC
Start: 2023-03-01 — End: 2023-03-08

## 2023-03-03 DIAGNOSIS — N898 Other specified noninflammatory disorders of vagina: Secondary | ICD-10-CM | POA: Insufficient documentation

## 2023-03-03 DIAGNOSIS — L299 Pruritus, unspecified: Secondary | ICD-10-CM | POA: Insufficient documentation

## 2023-03-03 NOTE — Assessment & Plan Note (Signed)
Presenting today for an acute visit endorsing a 2-day history of generalized itching.  No rashes identified on exam.  As noted above, she has not used any new products recently. -Hydroxyzine 25 mg every 8 hours as needed for itch relief prescribed today -She was instructed to return to care if her symptoms worsen or fail to improve

## 2023-03-03 NOTE — Assessment & Plan Note (Signed)
She additionally endorses a recent history of vaginal irritation with white discharge. -New swab performed today.  Will follow results. -Empirically treat for Candida vaginitis.  Diflucan 150 mg once prescribed today

## 2023-03-07 ENCOUNTER — Encounter: Payer: Self-pay | Admitting: Internal Medicine

## 2023-03-07 ENCOUNTER — Other Ambulatory Visit: Payer: Self-pay

## 2023-03-07 ENCOUNTER — Encounter (HOSPITAL_COMMUNITY): Payer: Self-pay | Admitting: *Deleted

## 2023-03-07 ENCOUNTER — Emergency Department (HOSPITAL_COMMUNITY)
Admission: EM | Admit: 2023-03-07 | Discharge: 2023-03-07 | Disposition: A | Discharge status: Home / Self Care | Payer: BC Managed Care – PPO | Attending: Emergency Medicine | Admitting: Emergency Medicine

## 2023-03-07 DIAGNOSIS — R21 Rash and other nonspecific skin eruption: Secondary | ICD-10-CM | POA: Insufficient documentation

## 2023-03-07 DIAGNOSIS — L299 Pruritus, unspecified: Secondary | ICD-10-CM | POA: Diagnosis not present

## 2023-03-07 DIAGNOSIS — R5383 Other fatigue: Secondary | ICD-10-CM | POA: Insufficient documentation

## 2023-03-07 DIAGNOSIS — R110 Nausea: Secondary | ICD-10-CM | POA: Diagnosis not present

## 2023-03-07 DIAGNOSIS — I10 Essential (primary) hypertension: Secondary | ICD-10-CM | POA: Diagnosis not present

## 2023-03-07 DIAGNOSIS — R42 Dizziness and giddiness: Secondary | ICD-10-CM | POA: Diagnosis not present

## 2023-03-07 DIAGNOSIS — E039 Hypothyroidism, unspecified: Secondary | ICD-10-CM | POA: Insufficient documentation

## 2023-03-07 LAB — URINALYSIS, ROUTINE W REFLEX MICROSCOPIC
Bilirubin Urine: NEGATIVE
Glucose, UA: NEGATIVE mg/dL
Hgb urine dipstick: NEGATIVE
Ketones, ur: NEGATIVE mg/dL
Leukocytes,Ua: NEGATIVE
Nitrite: NEGATIVE
Protein, ur: NEGATIVE mg/dL
Specific Gravity, Urine: 1.011 (ref 1.005–1.030)
pH: 6 (ref 5.0–8.0)

## 2023-03-07 LAB — CBC WITH DIFFERENTIAL/PLATELET
Abs Immature Granulocytes: 0.03 10*3/uL (ref 0.00–0.07)
Basophils Absolute: 0 10*3/uL (ref 0.0–0.1)
Basophils Relative: 0 %
Eosinophils Absolute: 0.1 10*3/uL (ref 0.0–0.5)
Eosinophils Relative: 1 %
HCT: 37.9 % (ref 36.0–46.0)
Hemoglobin: 12.4 g/dL (ref 12.0–15.0)
Immature Granulocytes: 0 %
Lymphocytes Relative: 27 %
Lymphs Abs: 2.6 10*3/uL (ref 0.7–4.0)
MCH: 29.2 pg (ref 26.0–34.0)
MCHC: 32.7 g/dL (ref 30.0–36.0)
MCV: 89.2 fL (ref 80.0–100.0)
Monocytes Absolute: 0.7 10*3/uL (ref 0.1–1.0)
Monocytes Relative: 7 %
Neutro Abs: 6.2 10*3/uL (ref 1.7–7.7)
Neutrophils Relative %: 65 %
Platelets: 265 10*3/uL (ref 150–400)
RBC: 4.25 MIL/uL (ref 3.87–5.11)
RDW: 13.2 % (ref 11.5–15.5)
WBC: 9.6 10*3/uL (ref 4.0–10.5)
nRBC: 0 % (ref 0.0–0.2)

## 2023-03-07 LAB — COMPREHENSIVE METABOLIC PANEL
ALT: 19 U/L (ref 0–44)
AST: 20 U/L (ref 15–41)
Albumin: 3.9 g/dL (ref 3.5–5.0)
Alkaline Phosphatase: 71 U/L (ref 38–126)
Anion gap: 9 (ref 5–15)
BUN: 13 mg/dL (ref 6–20)
CO2: 24 mmol/L (ref 22–32)
Calcium: 9.3 mg/dL (ref 8.9–10.3)
Chloride: 102 mmol/L (ref 98–111)
Creatinine, Ser: 0.91 mg/dL (ref 0.44–1.00)
GFR, Estimated: >60 mL/min (ref >60)
Glucose, Bld: 88 mg/dL (ref 70–99)
Potassium: 3.6 mmol/L (ref 3.5–5.1)
Sodium: 135 mmol/L (ref 135–145)
Total Bilirubin: 0.5 mg/dL (ref 0.3–1.2)
Total Protein: 7.3 g/dL (ref 6.5–8.1)

## 2023-03-07 LAB — PREGNANCY, URINE: Preg Test, Ur: NEGATIVE

## 2023-03-07 MED ORDER — TRIAMCINOLONE ACETONIDE 0.1 % EX CREA: 1.0000 | TOPICAL_CREAM | Freq: Two times a day (BID) | CUTANEOUS | 0 refills | Status: AC

## 2023-03-07 MED ORDER — SODIUM CHLORIDE 0.9 % IV BOLUS
1000.0000 mL | Freq: Once | INTRAVENOUS | Status: AC
  Administered 2023-03-07: 1000 mL via INTRAVENOUS

## 2023-03-07 MED ORDER — ACETAMINOPHEN 325 MG PO TABS
650.0000 mg | ORAL_TABLET | Freq: Once | ORAL | Status: AC
  Administered 2023-03-07: 650 mg via ORAL
  Filled 2023-03-07: qty 2

## 2023-03-07 MED ORDER — ONDANSETRON HCL 4 MG/2ML IJ SOLN
4.0000 mg | Freq: Once | INTRAMUSCULAR | Status: AC
  Administered 2023-03-07: 4 mg via INTRAVENOUS
  Filled 2023-03-07: qty 2

## 2023-03-07 NOTE — ED Provider Notes (Signed)
[facility] EMERGENCY DEPARTMENT AT [facility] Provider Note   CSN: [id number] Arrival date & time: 03/07/23  1657     History  Chief Complaint  Patient presents with   Rash    Rachel Stevens is a 42 y.o. female.  PMH of hypertension and hypothyroidism.  Presents to the ER today for evaluation of a rash and feeling generally unwell. She states she has had some mild skin itching for a couple of days, but some raised red bumps to the right shoulder, dorsum of the right hand and left forearm.  She noticed these while she was at work.  She works as a Company secretary. She also notes that today she had been having nausea, fatigue and feeling somewhat lightheaded.  She has history of vertigo but denies dizziness symptoms today.  No numbness tingling or weakness, no chest pain palpitations or shortness of breath. Denies spending outside time, denies any known insect bites, denies any tick bites.  No fever, no headache or neck stiffness.  States she has some itching that was generalized on 5/30 without any rash and was seen at another ED and given hydroxyzine without relief  Rash      Home Medications Prior to Admission medications   Medication Sig Start Date End Date Taking? Authorizing Provider  triamcinolone cream (KENALOG) 0.1 % Apply 1 Application topically 2 (two) times daily. 03/07/23  Yes Trevante Tennell A, PA-C  Ergocalciferol (VITAMIN D2) 50 MCG (2000 UT) TABS Take by mouth.    [provider]  ferrous sulfate 325 (65 FE) MG tablet Take 325 mg by mouth daily with breakfast.    [provider]  hydrOXYzine (VISTARIL) 25 MG capsule Take 1 capsule (25 mg total) by mouth every 8 (eight) hours as needed. 02/25/23   Billie Lade, MD  metroNIDAZOLE (FLAGYL) 500 MG tablet Take 1 tablet (500 mg total) by mouth 2 (two) times daily for 7 days. 03/01/23 03/08/23  Billie Lade, MD  Multiple Vitamins-Minerals (MULTIVITAMIN WITH MINERALS) tablet Take 1 tablet  by mouth daily.    [provider]  pantoprazole (PROTONIX) 20 MG tablet Take 1 tablet by mouth daily. 11/30/22   [provider]      Allergies    Bee venom, Latex, and Penicillins    Review of Systems   Review of Systems  Skin:  Positive for rash.    Physical Exam Updated Vital Signs BP (!) 125/97 (BP Location: Right Arm)   Pulse 73   Temp 97.6 F (36.4 C) (Temporal)   Resp 14   Ht 5\' 6"  (1.676 m)   Wt 81.6 kg   LMP 02/17/2023   SpO2 100%   BMI 29.05 kg/m  Physical Exam Vitals and nursing note reviewed.  Constitutional:      General: She is not in acute distress.    Appearance: She is well-developed.  HENT:     Head: Normocephalic and atraumatic.  Eyes:     Conjunctiva/sclera: Conjunctivae normal.  Cardiovascular:     Rate and Rhythm: Normal rate and regular rhythm.     Heart sounds: No murmur heard. Pulmonary:     Effort: Pulmonary effort is normal. No respiratory distress.     Breath sounds: Normal breath sounds.  Abdominal:     Palpations: Abdomen is soft.     Tenderness: There is no abdominal tenderness.  Musculoskeletal:        General: No swelling.     Cervical back: Neck supple.  Skin:    General: Skin is warm and dry.     Capillary Refill: Capillary refill takes less than 2 seconds.     Comments: Show to the skin, there are 3 raised erythematous, circular areas with a central puncta to the right trapezius, similar lesion to the dorsum of the right hand and 2 to the left forearm.  No surrounding cellulitis, no radiation or fluctuance.  Neurological:     General: No focal deficit present.     Mental Status: She is alert and oriented to person, place, and time.  Psychiatric:        Mood and Affect: Mood normal.     ED Results / Procedures / Treatments   Labs (all labs ordered are listed, but only abnormal results are displayed) Labs Reviewed  CBC WITH DIFFERENTIAL/PLATELET  COMPREHENSIVE METABOLIC PANEL  URINALYSIS, ROUTINE W  REFLEX MICROSCOPIC  PREGNANCY, URINE    EKG None  Radiology No results found.  Procedures Procedures    Medications Ordered in ED Medications  ondansetron (ZOFRAN) injection 4 mg (4 mg Intravenous Given 03/07/23 1747)  sodium chloride 0.9 % bolus 1,000 mL (1,000 mLs Intravenous New Bag/Given 03/07/23 1747)  acetaminophen (TYLENOL) tablet 650 mg (650 mg Oral Given 03/07/23 1907)    ED Course/ Medical Decision Making/ A&P                             Medical Decision Making Dx: Contact dermatitis, atopic dermatitis, tick bite, tickborne illness, dehydration, other  ED course: Patient presents to the ED today for rash, she has had itching for couple of days but the rash came up today, she does have several bumps that are in a line.  Also could be bedbugs, she does work in facility as a Secondary school teacher.  Denies any known exposures.  Denies any tick bites, does not spend time outside, no other systemic symptoms but states she just feels generally unwell with nausea and some lightheadedness.  No numbness tingling or weakness, does not feel off balance.  No vomiting or abdominal pain.  Labs are all reassuring, she is not pregnant, she is afebrile, vitals reassuring as well.  She does state that she only got about 5 hours of sleep last night and then went to church and then went to work and thinks this may be why she does not feel well.  Regarding her rash we are treating her symptomatically, she refused systemic steroids so was given topical steroids.  There are no signs of cellulitis.  She states the hydroxyzine she has at home is not really helpful so discussed she could take over-the-counter Allegra or Zyrtec.  Running her feeling generally unwell, she is no other systemic symptoms, she has normal blood pressure, EKG shows sinus rhythm, she does not have chest pain or shortness of breath, she is not hypoxic.  She feels this may be due to lack of sleep which is reasonable.  Discussed that if  she still feeling unwell tomorrow she should call her PCP for close follow-up appointment, discussed that she develops any new or worsening symptoms she is to come back to the ER.   Amount and/or Complexity of Data Reviewed Labs: ordered.  Risk OTC drugs. Prescription drug management.           Final Clinical Impression(s) / ED Diagnoses Final diagnoses:  Rash  Dizziness    Rx / DC Orders ED Discharge Orders  Ordered    triamcinolone cream (KENALOG) 0.1 %  2 times daily        03/07/23 1903              Josem Kaufmann 03/07/23 1918    Bethann Berkshire, MD 03/09/23 1303

## 2023-03-07 NOTE — Discharge Instructions (Addendum)
Rash appears to be from possible bug bites.  May be from bedbugs.  I would take all of your close honey at home and wash them in hot water.  Wash your bedding in hot water and take shower.  You can use topical cream as prescribed on the areas.  Take the Vistaril you are prescribed previously as needed for the itching. Please come back to the ER if you develop new or worsening symptoms.  If you are not feeling better tomorrow regarding your lightheadedness you should follow-up with your doctor.  If you have any new or worsening symptoms such as a bad headache, vomiting, fever or any other worrisome changes come back to the ER.

## 2023-03-07 NOTE — ED Triage Notes (Signed)
Pt with rash that started yesterday with itches.  Pt also c/o nausea. Pt denies any new soaps or detergents, denies any food or medications. + fatigue.

## 2023-03-08 ENCOUNTER — Other Ambulatory Visit: Payer: Self-pay

## 2023-03-08 ENCOUNTER — Telehealth: Payer: Self-pay

## 2023-03-08 DIAGNOSIS — R9431 Abnormal electrocardiogram [ECG] [EKG]: Secondary | ICD-10-CM

## 2023-03-08 NOTE — Transitions of Care (Post Inpatient/ED Visit) (Signed)
   03/08/2023  Name: Rachel Stevens MRN: [id number] DOB: 01-17-1981  Today's TOC FU Call Status: Today's TOC FU Call Status:: Unsuccessul Call (1st Attempt) Unsuccessful Call (1st Attempt) Date: 03/08/23  Attempted to reach the patient regarding the most recent Inpatient/ED visit.  Follow Up Plan: Additional outreach attempts will be made to reach the patient to complete the Transitions of Care (Post Inpatient/ED visit) call.    Pt states she will call back- in an appt at this time  Signature  Woodfin Ganja LPN [facility] Nurse Health Advisor Direct Dial [phone]

## 2023-03-09 ENCOUNTER — Ambulatory Visit: Payer: BC Managed Care – PPO | Attending: Internal Medicine | Admitting: Internal Medicine

## 2023-03-09 ENCOUNTER — Other Ambulatory Visit: Payer: BC Managed Care – PPO

## 2023-03-09 ENCOUNTER — Encounter: Payer: Self-pay | Admitting: Internal Medicine

## 2023-03-09 DIAGNOSIS — R42 Dizziness and giddiness: Secondary | ICD-10-CM | POA: Diagnosis not present

## 2023-03-09 DIAGNOSIS — R002 Palpitations: Secondary | ICD-10-CM

## 2023-03-09 NOTE — Transitions of Care (Post Inpatient/ED Visit) (Signed)
   03/09/2023  Name: Rachel Stevens MRN: [id number] DOB: June 13, 1981  Today's TOC FU Call Status: Today's TOC FU Call Status:: Successful TOC FU Call Competed Unsuccessful Call (1st Attempt) Date: 03/08/23 [facility] FU Call Complete Date: 03/09/23  Transition Care Management Follow-up Telephone Call Date of Discharge: 03/09/23 [facility] Type of Discharge: Emergency Department Reason for ED Visit: Other: (rash) How have you been since you were released from the hospital?: Same Any questions or concerns?: Yes Patient Questions/Concerns:: rash not improving  Items Reviewed: Did you receive and understand the discharge instructions provided?: Yes Medications obtained,verified, and reconciled?: Yes (Medications Reviewed) Any new allergies since your discharge?: No Dietary orders reviewed?: No Do you have support at home?: No  Medications Reviewed Today: Medications Reviewed Today     Reviewed by Merleen Nicely, LPN (Licensed Practical Nurse) on 03/09/23 at 1316  Med List Status: <None>   Medication Order Taking? Sig Documenting Provider Last Dose Status Informant  Ergocalciferol (VITAMIN D2) 50 MCG (2000 UT) TABS [id number] Yes Take by mouth. [provider] Taking Active   ferrous sulfate 325 (65 FE) MG tablet [id number] Yes Take 325 mg by mouth daily with breakfast. [provider] Taking Active   hydrOXYzine (VISTARIL) 25 MG capsule [id number] Yes Take 1 capsule (25 mg total) by mouth every 8 (eight) hours as needed. Billie Lade, MD Taking Active   Multiple Vitamins-Minerals (MULTIVITAMIN WITH MINERALS) tablet [id number] Yes Take 1 tablet by mouth daily. [provider] Taking Active Self  pantoprazole (PROTONIX) 20 MG tablet [id number] Yes Take 1 tablet by mouth daily. [provider] Taking Active   triamcinolone cream (KENALOG) 0.1 % [id number] Yes Apply 1 Application topically 2 (two) times daily. Ma Rings, PA-C  Taking Active   Med List Note Emilia Beck 10/27/14 1320): No Pharmacy preference            Home Care and Equipment/Supplies: Were Home Health Services Ordered?: No Any new equipment or medical supplies ordered?: No  Functional Questionnaire: Do you need assistance with bathing/showering or dressing?: No Do you need assistance with meal preparation?: No Do you need assistance with eating?: No Do you have difficulty maintaining continence: No Do you need assistance with getting out of bed/getting out of a chair/moving?: No Do you have difficulty managing or taking your medications?: No  Follow up appointments reviewed: PCP Follow-up appointment confirmed?: Yes MD Provider Line Number:[phone] Given: Yes Date of PCP follow-up appointment?: 03/12/23 Follow-up Provider: [facility] Follow-up appointment confirmed?: No Do you need transportation to your follow-up appointment?: No Do you understand care options if your condition(s) worsen?: Yes-patient verbalized understanding    SIGNATURE  Woodfin Ganja LPN [facility] Nurse Health Advisor Direct Dial [phone]

## 2023-03-09 NOTE — Patient Instructions (Signed)
Medication Instructions:  Your physician recommends that you continue on your current medications as directed. Please refer to the Current Medication list given to you today.   Labwork: None today  Testing/Procedures: Your physician has requested that you have an echocardiogram. Echocardiography is a painless test that uses sound waves to create images of your heart. It provides your doctor with information about the size and shape of your heart and how well your heart's chambers and valves are working. This procedure takes approximately one hour. There are no restrictions for this procedure. Please do NOT wear cologne, perfume, aftershave, or lotions (deodorant is allowed). Please arrive 15 minutes prior to your appointment time.    Your physician has requested that you have an exercise tolerance test. For further information please visit [email or web address] Please also follow instruction sheet, as given.    ZIO XT- Long Term Monitor Instructions   Your physician has requested you wear your ZIO patch monitor____14___days.   This is a single patch monitor.  [employer] supplies one patch monitor per enrollment.  Additional stickers are not available.   Do not shower for the first 24 hours.  You may shower after the first 24 hours.   Press button if you feel a symptom. You will hear a small click.  Record Date, Time and Symptom in the Patient Log Book.   When you are ready to remove patch, follow instructions on last 2 pages of Patient Log Book.  Stick patch monitor onto last page of Patient Log Book.   Place Patient Log Book in [place] box.  Use locking tab on box and tape box closed securely.  The [employer] and [employer] has [employer] on it.  Please place in mailbox as soon as possible.  Your physician should have your test results approximately 7 days after the monitor has been mailed back to [facility].   Call [facility] Customer Care at [phone] if you have questions  regarding your ZIO XT patch monitor.  Call them immediately if you see an orange light blinking on your monitor.   If your monitor falls off in less than 4 days contact our Monitor department at [phone].  If your monitor becomes loose or falls off after 4 days call [employer] at [phone] for suggestions on securing your monitor.     Follow-Up: Pending test  results  Any Other Special Instructions Will Be Listed Below (If Applicable).  If you need a refill on your cardiac medications before your next appointment, please call your pharmacy.

## 2023-03-09 NOTE — Progress Notes (Signed)
Cardiology Office Note  Date: 03/09/2023   ID: Rachel Stevens, DOB 07-24-1981, MRN [id number]  PCP:  Billie Lade, MD  Cardiologist:  None Electrophysiologist:  None   Reason for Office Visit: Evaluation of dizziness at the request of Dr. Durwin Nora   History of Present Illness: Rachel Stevens is a 42 y.o. female known to have Mnire's disease was referred to cardiology clinic for evaluation of dizziness.  Patient had ER visit for rash and underwent EKG that showed NSR but on a few occasions, it showed atrial fibrillation.  I reviewed the EKGs on the EMR from 03/07/2023 that showed artifact and not a true atrial fibrillation.  Otherwise, remaining EKG showed NSR.  Patient has been feeling dizzy for the last 2 years, occurs sporadically, no relation with rest or exercise and had to sit down to feel better.  She did not report room spinning sensation with dizziness.  Whenever she feels dizzy, her chest also feels funny and sometimes has palpitations.  No true angina or DOE.  Patient was told that her dizziness could likely be secondary to Mnire's disease and also from low iron.  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   ESOPHAGOGASTRODUODENOSCOPY  03/29/2020   [facility]; normal exam    Current Outpatient Medications  Medication Sig Dispense Refill   Ergocalciferol (VITAMIN D2) 50 MCG (2000 UT) TABS Take by mouth.     ferrous  sulfate 325 (65 FE) MG tablet Take 325 mg by mouth daily with breakfast.     hydrOXYzine (VISTARIL) 25 MG capsule Take 1 capsule (25 mg total) by mouth every 8 (eight) hours as needed. 30 capsule 0   Multiple Vitamins-Minerals (MULTIVITAMIN WITH MINERALS) tablet Take 1 tablet by mouth daily.     pantoprazole (PROTONIX) 20 MG tablet Take 1 tablet by mouth daily.     triamcinolone cream (KENALOG) 0.1 % Apply 1 Application topically 2 (two) times daily. 30 g 0   No current facility-administered medications for this visit.   Allergies:  Bee venom, Latex, and Penicillins   Social History: The patient  reports that she has never smoked. She has never used smokeless tobacco. She reports that she does not currently use alcohol. She reports that she does not use drugs.   Family History: The patient's family history includes Breast cancer in her maternal aunt; Cancer in her father, maternal grandfather, and paternal grandmother; Congestive Heart Failure in her brother; Heart disease in her maternal grandmother and mother; Hypertension in her maternal grandfather, maternal grandmother, and mother; Pancreatic cancer in her father.   ROS:  Please see the history of present illness. Otherwise, complete review of systems is positive for none.  All other systems are reviewed and negative.   Physical Exam: VS:  LMP 02/17/2023 , BMI There is no height  or weight on file to calculate BMI.  Wt Readings from Last 3 Encounters:  03/07/23 180 lb (81.6 kg)  02/25/23 183 lb 6.4 oz (83.2 kg)  02/02/23 179 lb (81.2 kg)    General: Patient appears comfortable at rest. HEENT: Conjunctiva and lids normal, oropharynx clear with moist mucosa. Neck: Supple, no elevated JVP or carotid bruits, no thyromegaly. Lungs: Clear to auscultation, nonlabored breathing at rest. Cardiac: Regular rate and rhythm, no S3 or significant systolic murmur, no pericardial rub. Abdomen: Soft, nontender, no hepatomegaly, bowel sounds present,  no guarding or rebound. Extremities: No pitting edema, distal pulses 2+. Skin: Warm and dry. Musculoskeletal: No kyphosis. Neuropsychiatric: Alert and oriented x3, affect grossly appropriate.  Recent Labwork: 02/25/2023: TSH 1.560 03/07/2023: ALT 19; AST 20; BUN 13; Creatinine, Ser 0.91; Hemoglobin 12.4; Platelets 265; Potassium 3.6; Sodium 135     Component Value Date/Time   CHOL 162 01/13/2023 1624   TRIG 109 01/13/2023 1624   HDL 54 01/13/2023 1624   CHOLHDL 3.0 01/13/2023 1624   LDLCALC 88 01/13/2023 1624    Other Studies Reviewed Today:   Assessment and Plan:  Patient is a 42 year old F known to have Mnire's disease was referred to cardiology clinic for evaluation dizziness.  # Dizziness -Patient has sporadic episodes of dizziness, no relation with rest or exercise and last for quite some time. Ongoing for the last 2 years.  Her chest also feels funny during the time of dizziness and occasional palpitations also occur. Obtain 2D echocardiogram to rule out structural heart disease, 2-week event monitor to rule out conduction system abnormalities and intermittent preexcitation, treadmill exercise stress EKG to rule out exertional causes of dizziness like ventricular arrhythmias.   I have spent a total of 30 minutes with patient reviewing chart, EKGs, labs and examining patient as well as establishing an assessment and plan that was discussed with the patient.  > 50% of time was spent in direct patient care.    Medication Adjustments/Labs and Tests Ordered: Current medicines are reviewed at length with the patient today.  Concerns regarding medicines are outlined above.   Tests Ordered: Orders Placed This Encounter  Procedures   Exercise Tolerance Test   LONG TERM MONITOR-LIVE TELEMETRY (3-14 DAYS)   EKG 12-Lead   ECHOCARDIOGRAM COMPLETE     Medication Changes: No orders of the defined types were placed in this encounter.   Disposition:  Follow up  pending  results  Signed, Loretta Kluender Verne Spurr, MD, 03/09/2023 3:28 PM    [facility] at [facility] [address] [place], [place] [address]

## 2023-03-10 ENCOUNTER — Other Ambulatory Visit: Payer: Self-pay | Admitting: *Deleted

## 2023-03-10 ENCOUNTER — Encounter: Payer: Self-pay | Admitting: *Deleted

## 2023-03-10 DIAGNOSIS — D509 Iron deficiency anemia, unspecified: Secondary | ICD-10-CM

## 2023-03-10 DIAGNOSIS — R09A2 Foreign body sensation, throat: Secondary | ICD-10-CM

## 2023-03-10 MED ORDER — PEG 3350-KCL-NA BICARB-NACL 420 G PO SOLR: 4000.0000 mL | Freq: Once | ORAL | 0 refills | Status: AC

## 2023-03-10 NOTE — Telephone Encounter (Signed)
Pt has been scheduled for 03/29/23. Instructions sent via MyChart and prep sent to the pharmacy

## 2023-03-12 ENCOUNTER — Inpatient Hospital Stay: Payer: BC Managed Care – PPO | Admitting: Internal Medicine

## 2023-03-13 ENCOUNTER — Ambulatory Visit: Payer: BC Managed Care – PPO

## 2023-03-14 ENCOUNTER — Encounter: Payer: Self-pay | Admitting: Internal Medicine

## 2023-03-14 ENCOUNTER — Ambulatory Visit: Payer: Self-pay

## 2023-03-15 ENCOUNTER — Telehealth: Payer: Self-pay | Admitting: Internal Medicine

## 2023-03-15 NOTE — Telephone Encounter (Signed)
Patient notified that email was sent to [facility] for monitor replacement. Pt verbalized understanding.

## 2023-03-15 NOTE — Telephone Encounter (Signed)
New Message:     Patient have sent a message by My Chart. She said her monitor came off. She wants to know if she needs to come by today or tomorrow to have it put back on, before her Stress Test tomorrow(03-16-23)?

## 2023-03-16 ENCOUNTER — Ambulatory Visit (HOSPITAL_COMMUNITY)
Admission: RE | Admit: 2023-03-16 | Discharge: 2023-03-16 | Disposition: A | Discharge status: Home / Self Care | Payer: BC Managed Care – PPO | Source: Ambulatory Visit | Attending: Internal Medicine | Admitting: Internal Medicine

## 2023-03-16 ENCOUNTER — Ambulatory Visit (INDEPENDENT_AMBULATORY_CARE_PROVIDER_SITE_OTHER): Payer: BC Managed Care – PPO | Admitting: Internal Medicine

## 2023-03-16 ENCOUNTER — Inpatient Hospital Stay (HOSPITAL_COMMUNITY)
Admission: RE | Admit: 2023-03-16 | Discharge: 2023-03-16 | Disposition: A | Discharge status: Home / Self Care | Payer: BC Managed Care – PPO | Source: Ambulatory Visit | Attending: Internal Medicine | Admitting: Internal Medicine

## 2023-03-16 ENCOUNTER — Encounter: Payer: Self-pay | Admitting: Internal Medicine

## 2023-03-16 VITALS — BP 111/74 | HR 71 | Resp 16 | Ht 66.0 in | Wt 179.0 lb

## 2023-03-16 DIAGNOSIS — S8992XS Unspecified injury of left lower leg, sequela: Secondary | ICD-10-CM

## 2023-03-16 DIAGNOSIS — R21 Rash and other nonspecific skin eruption: Secondary | ICD-10-CM | POA: Insufficient documentation

## 2023-03-16 DIAGNOSIS — R42 Dizziness and giddiness: Secondary | ICD-10-CM | POA: Diagnosis present

## 2023-03-16 DIAGNOSIS — S8992XA Unspecified injury of left lower leg, initial encounter: Secondary | ICD-10-CM | POA: Diagnosis not present

## 2023-03-16 LAB — EXERCISE TOLERANCE TEST
Angina Index: 0
Duke Treadmill Score: 7
Estimated workload: 9.3
Exercise duration (min): 6 min
Exercise duration (sec): 46 s
MPHR: 178 {beats}/min
Peak HR: 171 {beats}/min
Percent HR: 96 %
RPE: 12
Rest HR: 71 {beats}/min
ST Depression (mm): 0 mm

## 2023-03-16 NOTE — Progress Notes (Signed)
Established Patient Office Visit  Subjective   Patient ID: Rachel Stevens, female    DOB: [birth year]  Age: 42 y.o. MRN: [id number]  Chief Complaint  Patient presents with   ER follow up     For rash    Leg Pain    Had a fall 1-2 years ago and fell on her left leg and had xray, nothing broken but now it will give out on her when walking sometimes and she wants more testing done to see what the cause is before it starts affecting other parts of the leg    Rachel Stevens returns to care today for ER follow-up.  She was last evaluated by me for an acute visit on 5/30 endorsing pruritus.  Hydroxyzine was prescribed for as needed symptom relief.  She then presented to the emergency department on 6/9 after developing a rash.  3 raised, erythematous, circular lesions were noted on the right hand as well as 2 additional lesions on the left forearm.  Treated with triamcinolone cream.  Of note, ECG obtained in the emergency department showed NSR, but occasionally demonstrated atrial fibrillation.  She had previously been referred to cardiology in the setting of dizziness and was seen on 6/11.  Exercise tolerance test completed today.  Event monitor pending.  Today Ms. Rachel Stevens reports that her rash has resolved.  Her acute concern is weakness in her left knee.  She reports falling onto the lateral aspect of her left knee 1-2 years ago.  X-rays obtained at that time were negative for fracture.  She was recently walking when she felt her left knee gave out, causing her to fall.  This does not happen frequently, however she would like to have this evaluated before further complications arise.  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   ESOPHAGOGASTRODUODENOSCOPY  03/29/2020   [facility]; normal exam   Social History   Tobacco Use   Smoking status: Never   Smokeless tobacco: Never  Vaping Use   Vaping Use: Never used  Substance Use Topics   Alcohol use: Not Currently   Drug use: No   Family History  Problem Relation Age of Onset   Hypertension Mother    Heart disease Mother    Cancer Father        throat   Pancreatic cancer Father    Congestive Heart Failure Brother    Heart disease Maternal Grandmother    Hypertension Maternal Grandmother    Cancer Maternal Grandfather    Hypertension Maternal Grandfather    Cancer Paternal Grandmother        breast   Breast cancer Maternal Aunt    Colon cancer Neg Hx    Inflammatory bowel disease Neg Hx    Allergies  Allergen Reactions   Bee Venom Other (See Comments)  blisters   Latex Swelling   Penicillins Hives    .Has patient had a PCN reaction causing immediate rash, facial/tongue/throat swelling, SOB or lightheadedness with hypotension: Yes Has patient had a PCN reaction causing severe rash involving mucus membranes or skin necrosis: No Has patient had a PCN reaction that required hospitalization: No Has patient had a PCN reaction occurring within the last 10 years: No If all of the above answers are "NO", then may proceed with Cephalosporin use.     Review of Systems  Musculoskeletal:  Positive for joint pain (Left knee weakness).  All other systems reviewed and are negative.     Objective:     BP 111/74   Pulse 71   Resp 16   Ht 5\' 6"  (1.676 m)   Wt 179 lb (81.2 kg)   LMP 02/17/2023   SpO2 98%   BMI 28.89 kg/m  BP Readings from Last 3 Encounters:  03/16/23  111/74  03/09/23 (!) 124/92  03/07/23 (!) 125/97   Physical Exam Vitals reviewed.  Constitutional:      General: She is not in acute distress.    Appearance: Normal appearance. She is not ill-appearing.  HENT:     Head: Normocephalic and atraumatic.     Right Ear: External ear normal.     Left Ear: External ear normal.     Nose: Nose normal. No congestion or rhinorrhea.     Mouth/Throat:     Mouth: Mucous membranes are moist.     Pharynx: Oropharynx is clear. No oropharyngeal exudate or posterior oropharyngeal erythema.  Eyes:     General: No scleral icterus.    Extraocular Movements: Extraocular movements intact.     Conjunctiva/sclera: Conjunctivae normal.     Pupils: Pupils are equal, round, and reactive to light.  Cardiovascular:     Rate and Rhythm: Normal rate and regular rhythm.     Pulses: Normal pulses.     Heart sounds: Normal heart sounds. No murmur heard.    No friction rub. No gallop.  Pulmonary:     Effort: Pulmonary effort is normal.     Breath sounds: Normal breath sounds. No wheezing, rhonchi or rales.  Abdominal:     General: Abdomen is flat. Bowel sounds are normal.     Palpations: Abdomen is soft.  Musculoskeletal:     Cervical back: Normal range of motion.     Comments: No obvious deformity on inspection of the left knee.  There is no tenderness palpation over the left knee.  ROM is generally intact.  Negative anterior drawer and Lachman's.  Negative McMurray's.  Skin:    General: Skin is warm and dry.     Findings: No rash.  Neurological:     Mental Status: She is alert.   Last CBC Lab Results  Component Value Date   WBC 9.6 03/07/2023   HGB 12.4 03/07/2023   HCT 37.9 03/07/2023   MCV 89.2 03/07/2023   MCH 29.2 03/07/2023   RDW 13.2 03/07/2023   PLT 265 03/07/2023   Last metabolic panel Lab Results  Component Value Date   GLUCOSE 88 03/07/2023   NA 135 03/07/2023   K 3.6 03/07/2023   CL 102 03/07/2023   CO2 24 03/07/2023   BUN 13  03/07/2023   CREATININE 0.91 03/07/2023   GFRNONAA >60 03/07/2023   CALCIUM 9.3 03/07/2023   PROT 7.3 03/07/2023   ALBUMIN 3.9 03/07/2023   LABGLOB 2.6 01/13/2023   AGRATIO 1.7 01/13/2023   BILITOT  0.5 03/07/2023   ALKPHOS 71 03/07/2023   AST 20 03/07/2023   ALT 19 03/07/2023   ANIONGAP 9 03/07/2023   Last lipids Lab Results  Component Value Date   CHOL 162 01/13/2023   HDL 54 01/13/2023   LDLCALC 88 01/13/2023   TRIG 109 01/13/2023   CHOLHDL 3.0 01/13/2023   Last thyroid functions Lab Results  Component Value Date   TSH 1.560 02/25/2023   Last vitamin D Lab Results  Component Value Date   VD25OH 31.9 01/13/2023   The 10-year ASCVD risk score (Arnett DK, et al., 2019) is: 0.3%    Assessment & Plan:   Problem List Items Addressed This Visit       Rash    Returning to care today for ER follow-up after ED presentation for an itching rash present on her left forearm.  Treated with triamcinolone cream.  Rash and itching have resolved.      Left knee injury, sequela    Her acute concern today is weakness of her left knee.  She reports that her left knee has recently given out, causing her to fall.  She reports a history of a fall onto her left knee 1-2 years ago.  She reports presenting to urgent care for evaluation.  X-rays obtained at that time were negative for fracture.  I cannot find these x-rays to review in our records currently.  Her exam today is unremarkable.  The pain she currently describes is not located in her knee, but rather her left hamstring and over the left greater trochanteric bursa. -Treatment options were reviewed.  She is in agreement with a referral for physical therapy.  If this does not improve her symptoms, consider referral to orthopedic surgery for further evaluation and management.      Return in about 3 months (around 06/16/2023).   Billie Lade, MD

## 2023-03-16 NOTE — Patient Instructions (Signed)
It was a pleasure to see you today.  Thank you for giving [place] the opportunity to be involved in your care.  Below is a brief recap of your visit and next steps.  We will plan to see you again in 3 months.  Summary Physical therapy referral placed today Follow up in 3 months

## 2023-03-16 NOTE — Assessment & Plan Note (Signed)
Returning to care today for ER follow-up after ED presentation for an itching rash present on her left forearm.  Treated with triamcinolone cream.  Rash and itching have resolved.

## 2023-03-16 NOTE — Assessment & Plan Note (Signed)
Her acute concern today is weakness of her left knee.  She reports that her left knee has recently given out, causing her to fall.  She reports a history of a fall onto her left knee 1-2 years ago.  She reports presenting to urgent care for evaluation.  X-rays obtained at that time were negative for fracture.  I cannot find these x-rays to review in our records currently.  Her exam today is unremarkable.  The pain she currently describes is not located in her knee, but rather her left hamstring and over the left greater trochanteric bursa. -Treatment options were reviewed.  She is in agreement with a referral for physical therapy.  If this does not improve her symptoms, consider referral to orthopedic surgery for further evaluation and management.

## 2023-03-23 ENCOUNTER — Encounter: Payer: Self-pay | Admitting: *Deleted

## 2023-03-23 NOTE — Telephone Encounter (Signed)
Pt wanted to know if she was supposed to stop Iron. Advised pt that iron would need to be stopped per providers instructions. She also states that she has been out of work and can't afford to get the fleet enema and bisacodyl. Offered pt samples of Clenpiq and she wouldn't have to buy anything else, just do the 2 bottles. Pt states she would do the Clenpiq. New instructions printed and placed up front with sample. Pt to come by and pick up sample.

## 2023-03-25 ENCOUNTER — Telehealth: Payer: Self-pay | Admitting: *Deleted

## 2023-03-25 ENCOUNTER — Encounter: Payer: Self-pay | Admitting: *Deleted

## 2023-03-25 NOTE — Telephone Encounter (Signed)
Moved pt up on providers schedule on 03/29/23. Informed pt to arrive at 11:15 to check in. Pt verbalized understanding.

## 2023-03-26 ENCOUNTER — Other Ambulatory Visit (HOSPITAL_COMMUNITY)
Admission: RE | Admit: 2023-03-26 | Discharge: 2023-03-26 | Disposition: A | Discharge status: Home / Self Care | Payer: BC Managed Care – PPO | Source: Ambulatory Visit | Attending: Internal Medicine | Admitting: Internal Medicine

## 2023-03-26 DIAGNOSIS — F419 Anxiety disorder, unspecified: Secondary | ICD-10-CM | POA: Diagnosis not present

## 2023-03-26 DIAGNOSIS — F32A Depression, unspecified: Secondary | ICD-10-CM | POA: Diagnosis not present

## 2023-03-26 DIAGNOSIS — R09A2 Foreign body sensation, throat: Secondary | ICD-10-CM

## 2023-03-26 DIAGNOSIS — M549 Dorsalgia, unspecified: Secondary | ICD-10-CM | POA: Diagnosis not present

## 2023-03-26 DIAGNOSIS — R1310 Dysphagia, unspecified: Secondary | ICD-10-CM | POA: Diagnosis not present

## 2023-03-26 DIAGNOSIS — K5730 Diverticulosis of large intestine without perforation or abscess without bleeding: Secondary | ICD-10-CM | POA: Diagnosis not present

## 2023-03-26 DIAGNOSIS — D509 Iron deficiency anemia, unspecified: Secondary | ICD-10-CM

## 2023-03-26 DIAGNOSIS — I10 Essential (primary) hypertension: Secondary | ICD-10-CM | POA: Diagnosis not present

## 2023-03-26 DIAGNOSIS — G8929 Other chronic pain: Secondary | ICD-10-CM | POA: Diagnosis not present

## 2023-03-26 DIAGNOSIS — H8109 Meniere's disease, unspecified ear: Secondary | ICD-10-CM | POA: Diagnosis not present

## 2023-03-26 DIAGNOSIS — K640 First degree hemorrhoids: Secondary | ICD-10-CM | POA: Diagnosis not present

## 2023-03-26 LAB — PREGNANCY, URINE: Preg Test, Ur: NEGATIVE

## 2023-03-29 ENCOUNTER — Ambulatory Visit (HOSPITAL_COMMUNITY): Payer: BC Managed Care – PPO | Admitting: Certified Registered"

## 2023-03-29 ENCOUNTER — Ambulatory Visit (HOSPITAL_COMMUNITY)
Admission: RE | Admit: 2023-03-29 | Discharge: 2023-03-29 | Disposition: A | Discharge status: Home / Self Care | Payer: BC Managed Care – PPO | Attending: Internal Medicine | Admitting: Internal Medicine

## 2023-03-29 ENCOUNTER — Encounter (HOSPITAL_COMMUNITY): Payer: Self-pay | Admitting: Internal Medicine

## 2023-03-29 ENCOUNTER — Other Ambulatory Visit: Payer: Self-pay

## 2023-03-29 ENCOUNTER — Encounter (HOSPITAL_COMMUNITY)
Admission: RE | Disposition: A | Discharge status: Home / Self Care | Payer: Self-pay | Source: Home / Self Care | Attending: Internal Medicine

## 2023-03-29 DIAGNOSIS — I10 Essential (primary) hypertension: Secondary | ICD-10-CM | POA: Insufficient documentation

## 2023-03-29 DIAGNOSIS — D509 Iron deficiency anemia, unspecified: Secondary | ICD-10-CM | POA: Insufficient documentation

## 2023-03-29 DIAGNOSIS — R1310 Dysphagia, unspecified: Secondary | ICD-10-CM | POA: Insufficient documentation

## 2023-03-29 DIAGNOSIS — M549 Dorsalgia, unspecified: Secondary | ICD-10-CM | POA: Insufficient documentation

## 2023-03-29 DIAGNOSIS — K640 First degree hemorrhoids: Secondary | ICD-10-CM | POA: Insufficient documentation

## 2023-03-29 DIAGNOSIS — K5730 Diverticulosis of large intestine without perforation or abscess without bleeding: Secondary | ICD-10-CM | POA: Insufficient documentation

## 2023-03-29 DIAGNOSIS — F419 Anxiety disorder, unspecified: Secondary | ICD-10-CM | POA: Insufficient documentation

## 2023-03-29 DIAGNOSIS — G8929 Other chronic pain: Secondary | ICD-10-CM | POA: Insufficient documentation

## 2023-03-29 DIAGNOSIS — H8109 Meniere's disease, unspecified ear: Secondary | ICD-10-CM | POA: Insufficient documentation

## 2023-03-29 DIAGNOSIS — F32A Depression, unspecified: Secondary | ICD-10-CM | POA: Insufficient documentation

## 2023-03-29 HISTORY — PX: ESOPHAGOGASTRODUODENOSCOPY (EGD) WITH PROPOFOL: SHX5813

## 2023-03-29 HISTORY — PX: COLONOSCOPY WITH PROPOFOL: SHX5780

## 2023-03-29 SURGERY — COLONOSCOPY WITH PROPOFOL
Anesthesia: General

## 2023-03-29 MED ORDER — LACTATED RINGERS IV SOLN: INTRAVENOUS | Status: DC

## 2023-03-29 MED ORDER — DEXMEDETOMIDINE HCL IN NACL 80 MCG/20ML IV SOLN
INTRAVENOUS | Status: DC | PRN
  Administered 2023-03-29: 12 ug via INTRAVENOUS

## 2023-03-29 MED ORDER — PROPOFOL 10 MG/ML IV BOLUS
INTRAVENOUS | Status: DC | PRN
  Administered 2023-03-29: 100 mg via INTRAVENOUS
  Administered 2023-03-29: 50 mg via INTRAVENOUS

## 2023-03-29 MED ORDER — LIDOCAINE HCL (CARDIAC) PF 100 MG/5ML IV SOSY
PREFILLED_SYRINGE | INTRAVENOUS | Status: DC | PRN
  Administered 2023-03-29: 100 mg via INTRAVENOUS

## 2023-03-29 MED ORDER — PROPOFOL 500 MG/50ML IV EMUL
INTRAVENOUS | Status: DC | PRN
  Administered 2023-03-29: 150 ug/kg/min via INTRAVENOUS

## 2023-03-29 NOTE — Op Note (Signed)
[facility] Patient Name: Rachel Stevens Procedure Date: 03/29/2023 11:39 AM MRN: [id number] Date of Birth: December 04, 1980 Attending Rachel Stevens: Gennette Pac , Rachel Stevens, [id number] CSN: [id number] Age: 42 Admit Type: Outpatient Procedure:                Colonoscopy Indications:              Iron deficiency anemia, Abnormal CT of the GI tract Providers:                Gennette Pac, Rachel Stevens, Buel Ream. Thomasena Edis RN, RN,                            [employer], [employer]. Jessee Avers, Technician,                            Dyann Ruddle Referring Rachel Stevens:              Medicines:                Propofol per Anesthesia Complications:            No immediate complications. Estimated Blood Loss:     Estimated blood loss: none. Procedure:                Pre-Anesthesia Assessment:                           - Prior to the procedure, a History and Physical                            was performed, and patient medications and                            allergies were reviewed. The patient's tolerance of                            previous anesthesia was also reviewed. The risks                            and benefits of the procedure and the sedation                            options and risks were discussed with the patient.                            All questions were answered, and informed consent                            was obtained. Prior Anticoagulants: The patient has                            taken no anticoagulant or antiplatelet agents. ASA                            Grade Assessment: II - A patient with mild systemic  disease. After reviewing the risks and benefits,                            the patient was deemed in satisfactory condition to                            undergo the procedure.                           After obtaining informed consent, the colonoscope                            was passed under direct vision. Throughout the                             procedure, the patient's blood pressure, pulse, and                            oxygen saturations were monitored continuously. The                            [id number]) scope was introduced through the                            anus and advanced to the 10 cm into the ileum. The                            colonoscopy was performed without difficulty. The                            patient tolerated the procedure well. The quality                            of the bowel preparation was adequate. The entire                            colon was well visualized. Scope In: 12:45:18 PM Scope Out: 12:58:46 PM Scope Withdrawal Time: 0 hours 8 minutes 38 seconds  Total Procedure Duration: 0 hours 13 minutes 28 seconds  Findings:      The perianal and digital rectal examinations were normal.      Non-bleeding internal hemorrhoids were found regular colonoscopy. Distal       10 cm of TI appeared normal the hemorrhoids were moderate, medium-sized       and Grade I (internal hemorrhoids that do not prolapse).      Scattered medium-mouthed diverticula were found in the sigmoid colon and       descending colon. Rectal vault seen well on?"face. Too small to retroflex.      The exam was otherwise without abnormality. Impression:               - Non-bleeding internal hemorrhoids.                           - Diverticulosis in the sigmoid colon and in the  descending colon. Normal-appearing terminal ileum                           - The examination was otherwise normal.                           - No specimens collected. Moderate Sedation:      Moderate (conscious) sedation was personally administered by an       anesthesia professional. The following parameters were monitored: oxygen       saturation, heart rate, blood pressure, respiratory rate, EKG, adequacy       of pulmonary ventilation, and response to care. Recommendation:           - Patient has a contact number  available for                            emergencies. The signs and symptoms of potential                            delayed complications were discussed with the                            patient. Return to normal activities tomorrow.                            Written discharge instructions were provided to the                            patient.                           - Advance diet as tolerated.                           - Repeat colonoscopy in 10 years for screening                            purposes.                           - Return to GI office in 3 months. Procedure Code(s):        --- Professional ---                           [phone], Colonoscopy, flexible; diagnostic, including                            collection of specimen(s) by brushing or washing,                            when performed (separate procedure) Diagnosis Code(s):        --- Professional ---                           K64.0, First degree hemorrhoids  D50.9, Iron deficiency anemia, unspecified                           K57.30, Diverticulosis of large intestine without                            perforation or abscess without bleeding                           R93.3, Abnormal findings on diagnostic imaging of                            other parts of digestive tract CPT copyright 2022 American Medical Association. All rights reserved. The codes documented in this report are preliminary and upon coder review may  be revised to meet current compliance requirements. Rachel Friends. Graciemae Delisle, Rachel Stevens Gennette Pac, Rachel Stevens 03/29/2023 1:03:34 PM This report has been signed electronically. Number of Addenda: 0

## 2023-03-29 NOTE — Transfer of Care (Signed)
Immediate Anesthesia Transfer of Care Note  Patient: Rachel Stevens  Procedure(s) Performed: COLONOSCOPY WITH PROPOFOL ESOPHAGOGASTRODUODENOSCOPY (EGD) WITH PROPOFOL  Patient Location: Endoscopy Unit  Anesthesia Type:General  Level of Consciousness: drowsy  Airway & Oxygen Therapy: Patient Spontanous Breathing  Post-op Assessment: Report given to RN and Post -op Vital signs reviewed and stable  Post vital signs: Reviewed and stable  Last Vitals:  Vitals Value Taken Time  BP    Temp    Pulse    Resp    SpO2      Last Pain:  Vitals:   03/29/23 1233  TempSrc:   PainSc: 0-No pain         Complications: No notable events documented.

## 2023-03-29 NOTE — Anesthesia Preprocedure Evaluation (Signed)
Anesthesia Evaluation  Patient identified by MRN, date of birth, ID band Patient awake    Reviewed: Allergy & Precautions, H&P , NPO status , Patient's Chart, lab work & pertinent test results, reviewed documented beta blocker date and time   Airway Mallampati: II  TM Distance: >3 FB Neck ROM: full    Dental no notable dental hx.    Pulmonary neg pulmonary ROS   Pulmonary exam normal breath sounds clear to auscultation       Cardiovascular Exercise Tolerance: Good hypertension, negative cardio ROS  Rhythm:regular Rate:Normal     Neuro/Psych  PSYCHIATRIC DISORDERS Anxiety Depression    negative neurological ROS  negative psych ROS   GI/Hepatic negative GI ROS, Neg liver ROS,,,  Endo/Other  negative endocrine ROS    Renal/GU negative Renal ROS  negative genitourinary   Musculoskeletal   Abdominal   Peds  Hematology negative hematology ROS (+) Blood dyscrasia, anemia   Anesthesia Other Findings   Reproductive/Obstetrics negative OB ROS                             Anesthesia Physical Anesthesia Plan  ASA: 2  Anesthesia Plan: General   Post-op Pain Management:    Induction:   PONV Risk Score and Plan: Propofol infusion  Airway Management Planned:   Additional Equipment:   Intra-op Plan:   Post-operative Plan:   Informed Consent: I have reviewed the patients History and Physical, chart, labs and discussed the procedure including the risks, benefits and alternatives for the proposed anesthesia with the patient or authorized representative who has indicated his/her understanding and acceptance.     Dental Advisory Given  Plan Discussed with: CRNA  Anesthesia Plan Comments:        Anesthesia Quick Evaluation

## 2023-03-29 NOTE — Discharge Instructions (Addendum)
EGD Discharge instructions Please read the instructions outlined below and refer to this sheet in the next few weeks. These discharge instructions provide you with general information on caring for yourself after you leave the hospital. Your doctor may also give you specific instructions. While your treatment has been planned according to the most current medical practices available, unavoidable complications occasionally occur. If you have any problems or questions after discharge, please call your doctor. ACTIVITY You may resume your regular activity but move at a slower pace for the next 24 hours.  Take frequent rest periods for the next 24 hours.  Walking will help expel (get rid of) the air and reduce the bloated feeling in your abdomen.  No driving for 24 hours (because of the anesthesia (medicine) used during the test).  You may shower.  Do not sign any important legal documents or operate any machinery for 24 hours (because of the anesthesia used during the test).  NUTRITION Drink plenty of fluids.  You may resume your normal diet.  Begin with a light meal and progress to your normal diet.  Avoid alcoholic beverages for 24 hours or as instructed by your caregiver.  MEDICATIONS You may resume your normal medications unless your caregiver tells you otherwise.  WHAT YOU CAN EXPECT TODAY You may experience abdominal discomfort such as a feeling of fullness or "gas" pains.  FOLLOW-UP Your doctor will discuss the results of your test with you.  SEEK IMMEDIATE MEDICAL ATTENTION IF ANY OF THE FOLLOWING OCCUR: Excessive nausea (feeling sick to your stomach) and/or vomiting.  Severe abdominal pain and distention (swelling).  Trouble swallowing.  Temperature over 101 F (37.8 C).  Rectal bleeding or vomiting of blood.     Colonoscopy Discharge Instructions  Read the instructions outlined below and refer to this sheet in the next few weeks. These discharge instructions provide you with  general information on caring for yourself after you leave the hospital. Your doctor may also give you specific instructions. While your treatment has been planned according to the most current medical practices available, unavoidable complications occasionally occur. If you have any problems or questions after discharge, call Dr. Gala Romney at [phone]. ACTIVITY You may resume your regular activity, but move at a slower pace for the next 24 hours.  Take frequent rest periods for the next 24 hours.  Walking will help get rid of the air and reduce the bloated feeling in your belly (abdomen).  No driving for 24 hours (because of the medicine (anesthesia) used during the test).   Do not sign any important legal documents or operate any machinery for 24 hours (because of the anesthesia used during the test).  NUTRITION Drink plenty of fluids.  You may resume your normal diet as instructed by your doctor.  Begin with a light meal and progress to your normal diet. Heavy or fried foods are harder to digest and may make you feel sick to your stomach (nauseated).  Avoid alcoholic beverages for 24 hours or as instructed.  MEDICATIONS You may resume your normal medications unless your doctor tells you otherwise.  WHAT YOU CAN EXPECT TODAY Some feelings of bloating in the abdomen.  Passage of more gas than usual.  Spotting of blood in your stool or on the toilet paper.  IF YOU HAD POLYPS REMOVED DURING THE COLONOSCOPY: No aspirin products for 7 days or as instructed.  No alcohol for 7 days or as instructed.  Eat a soft diet for the next 24 hours.  FINDING OUT THE RESULTS OF YOUR TEST Not all test results are available during your visit. If your test results are not back during the visit, make an appointment with your caregiver to find out the results. Do not assume everything is normal if you have not heard from your caregiver or the medical facility. It is important for you to follow up on all of your test  results.  SEEK IMMEDIATE MEDICAL ATTENTION IF: You have more than a spotting of blood in your stool.  Your belly is swollen (abdominal distention).  You are nauseated or vomiting.  You have a temperature over 101.  You have abdominal pain or discomfort that is severe or gets worse throughout the day.       Your upper GI tract appeared normal   you have diverticulosis only in your colon  Recommend repeat colonoscopy in 10 years for screening purposes   office visit with Ermalinda Memos in 3 months  OFFICE WILL CALL WITH APPOINTMENT   at patient request, I called Andreas Ohm at [phone] -  reviewed findings and recommendations

## 2023-03-29 NOTE — Anesthesia Procedure Notes (Signed)
Date/Time: 03/29/2023 12:41 PM  Performed by: Julian Reil, CRNAPre-anesthesia Checklist: Patient identified, Emergency Drugs available, Suction available and Patient being monitored Patient Re-evaluated:Patient Re-evaluated prior to induction Oxygen Delivery Method: Nasal cannula Induction Type: IV induction Placement Confirmation: positive ETCO2

## 2023-03-29 NOTE — H&P (Signed)
@LOGO @   Primary Care Physician:  Billie Lade, MD Primary Gastroenterologist:  Dr. Jena Gauss  Pre-Procedure History & Physical: HPI:  Rachel Stevens is a 42 y.o. female here for  dysphagia, iron deficiency anemia, abnormal colon on CT via EGD and colonoscopy.  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   ESOPHAGOGASTRODUODENOSCOPY  03/29/2020   [facility]; normal exam    Prior to Admission medications   Medication Sig Start Date End Date Taking? Authorizing Provider  ferrous sulfate 325 (65 FE) MG tablet Take 325 mg by mouth daily with breakfast.   Yes [provider]  hydrOXYzine (VISTARIL) 25 MG capsule Take 1 capsule (25 mg total) by mouth every 8 (eight) hours as needed. 02/25/23  Yes Billie Lade, MD  Multiple Vitamin (MULTIVITAMIN) LIQD Take 30 mLs by mouth daily.   Yes [provider]  pantoprazole (PROTONIX) 20 MG tablet Take 20 mg by mouth daily. 11/30/22  Yes [provider]  triamcinolone cream (KENALOG) 0.1 % Apply 1 Application topically 2 (two) times daily. 03/07/23  Yes Beatty, Celeste A, PA-C    Allergies as of 03/10/2023 - Review Complete 03/09/2023  Allergen Reaction Noted   Bee venom Other (See Comments) 04/30/2011   Latex Swelling 04/30/2011   Penicillins Hives 04/30/2011    Family History  Problem Relation Age of Onset   Hypertension Mother    Heart disease Mother     Cancer Father        throat   Pancreatic cancer Father    Congestive Heart Failure Brother    Heart disease Maternal Grandmother    Hypertension Maternal Grandmother    Cancer Maternal Grandfather    Hypertension Maternal Grandfather    Cancer Paternal Grandmother        breast   Breast cancer Maternal Aunt    Colon cancer Neg Hx    Inflammatory bowel disease Neg Hx     Social History   Socioeconomic History   Marital status: Divorced    Spouse name: Not on file   Number of children: 9   Years of education: 12   Highest education level: Not on file  Occupational History   Not on file  Tobacco Use   Smoking status: Never   Smokeless tobacco: Never  Vaping Use   Vaping Use: Never used  Substance and Sexual Activity   Alcohol use: Not Currently   Drug use: No   Sexual activity: Yes    Partners: Male    Birth control/protection: Condom  Other Topics Concern   Not on file  Social History Narrative   Not on file   Social Determinants of Health   Financial Resource Strain: Not on file  Food Insecurity: Not on file  Transportation Needs: Not on file  Physical Activity: Not on file  Stress: Not on file  Social Connections: Not on file  Intimate Partner Violence: Not At Risk (03/27/2022)   Humiliation, Afraid, Rape, and Kick questionnaire    Fear of Current or Ex-Partner: No    Emotionally Abused: No    Physically Abused: No    Sexually Abused: No    Review of Systems: See HPI, otherwise negative ROS  Physical Exam: Pulse 81   Temp 98.2 F (36.8 C) (Oral)   Resp 10   LMP 03/22/2023   SpO2 100%  General:   Alert,  Well-developed, well-nourished, pleasant and cooperative in NAD Mouth:  No deformity or lesions. Neck:  Supple; no masses or thyromegaly. No significant cervical adenopathy. Lungs:  Clear throughout to auscultation.   No wheezes, crackles, or rhonchi. No acute distress. Heart:  Regular rate and rhythm; no murmurs, clicks, rubs,  or  gallops. Abdomen: Non-distended, normal bowel sounds.  Soft and nontender without appreciable mass or hepatosplenomegaly.   Impression/Plan:    42 year old lady with dysphagia, iron deficiency anemia and abnormal colon on CT.  today, patient tells me that dysphagia resolved with resumption of Protonix.  Does absolutely denies any difficulties with swallowing.  I have offered her EGD today and a colonoscopy.  Unless I find a lesion amenable to dilation of dilation be performed.   Discussed with also who was at the bedside. The risks, benefits, limitations, imponderables and alternatives regarding both EGD and colonoscopy have been reviewed with the patient. Questions have been answered. All parties agreeable.       Notice: This dictation was prepared with Dragon dictation along with smaller phrase technology. Any transcriptional errors that result from this process are unintentional and may not be corrected upon review.

## 2023-03-29 NOTE — Op Note (Signed)
[facility] Patient Name: Rachel Stevens Procedure Date: 03/29/2023 11:40 AM MRN: [id number] Date of Birth: 09/25/1981 Attending MD: Gennette Pac , MD, [id number] CSN: [id number] Age: 42 Admit Type: Outpatient Procedure:                Upper GI endoscopy Indications:              Dysphagia; patient denies any form of dysphagia now                            that she resume Protonix Providers:                Gennette Pac, MD, Buel Ream. Thomasena Edis RN, RN,                            [employer], [employer]. Jessee Avers, Technician,                            Dyann Ruddle Referring MD:              Medicines:                Propofol per Anesthesia Complications:            No immediate complications. Estimated Blood Loss:     Estimated blood loss: none. Procedure:                Pre-Anesthesia Assessment:                           - Prior to the procedure, a History and Physical                            was performed, and patient medications and                            allergies were reviewed. The patient's tolerance of                            previous anesthesia was also reviewed. The risks                            and benefits of the procedure and the sedation                            options and risks were discussed with the patient.                            All questions were answered, and informed consent                            was obtained. Prior Anticoagulants: The patient has                            taken no anticoagulant or antiplatelet agents.  After reviewing the risks and benefits, the patient                            was deemed in satisfactory condition to undergo the                            procedure.                           After obtaining informed consent, the endoscope was                            passed under direct vision. Throughout the                            procedure, the patient's blood  pressure, pulse, and                            oxygen saturations were monitored continuously. The                            [id number]-[id number] ([id number]) scope was introduced through the                            mouth, and advanced to the second part of duodenum.                            The upper GI endoscopy was accomplished without                            difficulty. The patient tolerated the procedure                            well. Scope In: 12:36:45 PM Scope Out: 12:39:42 PM Total Procedure Duration: 0 hours 2 minutes 57 seconds  Findings:      The examined esophagus was normal. Gastric cavity empty.      The entire examined stomach was normal. Patent pylorus.      The duodenal bulb and second portion of the duodenum were normal. Impression:               - Normal esophagus.                           - Normal stomach.                           - Normal duodenal bulb and second portion of the                            duodenum.                           - No specimens collected. Moderate Sedation:      Moderate (conscious) sedation was personally administered by an       anesthesia professional. The following parameters were monitored: oxygen  saturation, heart rate, blood pressure, respiratory rate, EKG, adequacy       of pulmonary ventilation, and response to care. Recommendation:           - Patient has a contact number available for                            emergencies. The signs and symptoms of potential                            delayed complications were discussed with the                            patient. Return to normal activities tomorrow.                            Written discharge instructions were provided to the                            patient.                           - Advance diet as tolerated.                           - Continue present medications.                           - Return to my office in 3 months. See colonoscopy                             report. Procedure Code(s):        --- Professional ---                           [phone], Esophagogastroduodenoscopy, flexible,                            transoral; diagnostic, including collection of                            specimen(s) by brushing or washing, when performed                            (separate procedure) Diagnosis Code(s):        --- Professional ---                           R13.10, Dysphagia, unspecified CPT copyright 2022 [employer] Association. All rights reserved. The codes documented in this report are preliminary and upon coder review may  be revised to meet current compliance requirements. Gerrit Friends. Taeya Theall, MD Gennette Pac, MD 03/29/2023 12:59:52 PM This report has been signed electronically. Number of Addenda: 0

## 2023-04-01 NOTE — Anesthesia Postprocedure Evaluation (Signed)
Anesthesia Post Note  Patient: Rachel Stevens  Procedure(s) Performed: COLONOSCOPY WITH PROPOFOL ESOPHAGOGASTRODUODENOSCOPY (EGD) WITH PROPOFOL  Patient location during evaluation: Phase II Anesthesia Type: General Level of consciousness: awake Pain management: pain level controlled Vital Signs Assessment: post-procedure vital signs reviewed and stable Respiratory status: spontaneous breathing and respiratory function stable Cardiovascular status: blood pressure returned to baseline and stable Postop Assessment: no headache and no apparent nausea or vomiting Anesthetic complications: no Comments: Late entry   No notable events documented.   Last Vitals:  Vitals:   03/29/23 1301 03/29/23 1306  BP: (!) 89/63 90/61  Pulse: 79   Resp: (!) 22   Temp: 36.5 C   SpO2: 95% 96%    Last Pain:  Vitals:   03/29/23 1311  TempSrc:   PainSc: 0-No pain                 Windell Norfolk

## 2023-04-05 ENCOUNTER — Ambulatory Visit (INDEPENDENT_AMBULATORY_CARE_PROVIDER_SITE_OTHER): Payer: BC Managed Care – PPO

## 2023-04-05 ENCOUNTER — Encounter: Payer: Self-pay | Admitting: Internal Medicine

## 2023-04-05 ENCOUNTER — Encounter (HOSPITAL_COMMUNITY): Payer: Self-pay | Admitting: Internal Medicine

## 2023-04-05 DIAGNOSIS — Z111 Encounter for screening for respiratory tuberculosis: Secondary | ICD-10-CM | POA: Diagnosis not present

## 2023-04-07 ENCOUNTER — Other Ambulatory Visit: Payer: Self-pay

## 2023-04-07 LAB — TB SKIN TEST
Induration: NEGATIVE mm
TB Skin Test: NEGATIVE

## 2023-04-07 MED ORDER — PANTOPRAZOLE SODIUM 20 MG PO TBEC: 20.0000 mg | DELAYED_RELEASE_TABLET | Freq: Every day | ORAL | 0 refills | Status: AC

## 2023-04-21 ENCOUNTER — Ambulatory Visit (HOSPITAL_COMMUNITY): Payer: BC Managed Care – PPO | Attending: Internal Medicine | Admitting: Physical Therapy

## 2023-04-22 ENCOUNTER — Ambulatory Visit (HOSPITAL_COMMUNITY): Admission: RE | Admit: 2023-04-22 | Payer: BC Managed Care – PPO | Source: Ambulatory Visit

## 2023-04-25 ENCOUNTER — Ambulatory Visit
Admission: RE | Admit: 2023-04-25 | Discharge: 2023-04-25 | Disposition: A | Discharge status: Home / Self Care | Payer: BC Managed Care – PPO | Source: Ambulatory Visit | Attending: Family Medicine | Admitting: Family Medicine

## 2023-04-25 VITALS — BP 128/82 | HR 83 | Temp 98.9°F | Resp 18

## 2023-04-25 DIAGNOSIS — N760 Acute vaginitis: Secondary | ICD-10-CM | POA: Insufficient documentation

## 2023-04-25 LAB — POCT URINALYSIS DIP (MANUAL ENTRY)
Bilirubin, UA: NEGATIVE
Blood, UA: NEGATIVE
Glucose, UA: NEGATIVE mg/dL
Ketones, POC UA: NEGATIVE mg/dL
Leukocytes, UA: NEGATIVE
Nitrite, UA: NEGATIVE
Protein Ur, POC: NEGATIVE mg/dL
Spec Grav, UA: 1.02 (ref 1.010–1.025)
Urobilinogen, UA: 0.2 E.U./dL
pH, UA: 6 (ref 5.0–8.0)

## 2023-04-25 NOTE — ED Provider Notes (Signed)
[facility]    CSN: [id number] Arrival date & time: 04/25/23  1051      History   Chief Complaint Chief Complaint  Patient presents with   URI    Headache with lower back pain etc. Larey Seat unwell - Entered by patient    HPI Rachel Stevens is a 42 y.o. female.   Presenting today with several day history of suprapubic pressure, vaginal discomfort, mild low back pain bilaterally.  States at her PCP appointment several days ago she had a bit of blood in her urine and is unsure if this might be a UTI or possibly some BV which she has struggled with often in the past.  Denies fever, chills, nausea, vomiting, diarrhea, constipation, dysuria, urinary urgency, abnormal vaginal discharge or odor.  Takes probiotics fairly regularly.  Otherwise not tried anything over-the-counter for symptoms.    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   Hypertension    Irregular periods 01/31/2015   Meniere disease    Missed period 11/01/2014   Patient desires pregnancy 11/01/2014   Pelvic floor tension    Vaginal discharge 11/01/2014   Vulvar pain    Weight gain 11/01/2014    Patient Active Problem List   Diagnosis Date Noted   Rash 03/16/2023   Left knee injury, sequela 03/16/2023   Itching 03/03/2023   Vaginal irritation 03/03/2023   Heart palpitations 01/13/2023   Prediabetes 01/13/2023   Encounter for general adult medical examination with abnormal findings 01/13/2023   Colon wall thickening 01/08/2023   Constipation 01/08/2023   Globus sensation 01/08/2023   Iron deficiency anemia 01/08/2023   Anxiety 08/19/2020   Breast mass in female 03/04/2020   Elevated blood pressure reading 02/21/2020   Obesity BMI=30.9  160 lbs 07/05/2019   Pelvic "burning" x3 years 07/05/2019   History of reduction surgery of right breast 2009 07/05/2019    Depression 07/05/2015   Dizziness 07/05/2015   Fatigue 07/05/2015   Vitamin B12 deficiency 07/05/2015   Vitamin D insufficiency 07/05/2015   Vulvar pain 07/05/2015   Irregular periods 01/31/2015   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   COLONOSCOPY WITH PROPOFOL N/A 03/29/2023   Procedure: COLONOSCOPY WITH PROPOFOL;  Surgeon: Corbin Ade, MD;  [facility];  Service: Endoscopy;  Laterality: N/A;  1:30 pm, asa 2   ESOPHAGOGASTRODUODENOSCOPY  03/29/2020   [facility]; normal exam   ESOPHAGOGASTRODUODENOSCOPY (EGD) WITH PROPOFOL N/A 03/29/2023   Procedure: ESOPHAGOGASTRODUODENOSCOPY (EGD) WITH PROPOFOL;  Surgeon: Corbin Ade, MD;  Location: [facility];  Service: Endoscopy;  Laterality: N/A;    OB History     Gravida  1   Para  0   Term  0   Preterm  0   AB  1   Living  0      SAB  1   IAB  0   Ectopic  0   Multiple  0   Live Births  0            Home Medications  Prior to Admission medications   Medication Sig Start Date End Date Taking? Authorizing Provider  ferrous sulfate 325 (65 FE) MG tablet Take 325 mg by mouth daily with breakfast.    [provider]  hydrOXYzine (VISTARIL) 25 MG capsule Take 1 capsule (25 mg total) by mouth every 8 (eight) hours as needed. 02/25/23   Billie Lade, MD  Multiple Vitamin (MULTIVITAMIN) LIQD Take 30 mLs by mouth daily.    [provider]  pantoprazole (PROTONIX) 20 MG tablet Take 1 tablet (20 mg total) by mouth daily. 04/07/23   Billie Lade, MD  triamcinolone cream (KENALOG) 0.1 % Apply 1 Application topically 2 (two) times daily. 03/07/23   Ma Rings, PA-C    Family History Family History  Problem Relation Age of Onset    Hypertension Mother    Heart disease Mother    Cancer Father        throat   Pancreatic cancer Father    Congestive Heart Failure Brother    Heart disease Maternal Grandmother    Hypertension Maternal Grandmother    Cancer Maternal Grandfather    Hypertension Maternal Grandfather    Cancer Paternal Grandmother        breast   Breast cancer Maternal Aunt    Colon cancer Neg Hx    Inflammatory bowel disease Neg Hx     Social History Social History   Tobacco Use   Smoking status: Never   Smokeless tobacco: Never  Vaping Use   Vaping status: Never Used  Substance Use Topics   Alcohol use: Not Currently   Drug use: No     Allergies   Bee venom, Latex, and Penicillins   Review of Systems Review of Systems Per HPI  Physical Exam Triage Vital Signs ED Triage Vitals [04/25/23 1107]  Encounter Vitals Group     BP 128/82     Systolic BP Percentile      Diastolic BP Percentile      Pulse Rate 83     Resp 18     Temp 98.9 F (37.2 C)     Temp Source Oral     SpO2 98 %     Weight      Height      Head Circumference      Peak Flow      Pain Score 0     Pain Loc      Pain Education      Exclude from Growth Chart    No data found.  Updated Vital Signs BP 128/82 (BP Location: Right Arm)   Pulse 83   Temp 98.9 F (37.2 C) (Oral)   Resp 18   LMP 04/06/2023 (Approximate)   SpO2 98%   Visual Acuity Right Eye Distance:   Left Eye Distance:   Bilateral Distance:    Right Eye Near:   Left Eye Near:    Bilateral Near:     Physical Exam Vitals and nursing note reviewed. Exam conducted with a chaperone present.  Constitutional:      Appearance: Normal appearance. She is not ill-appearing.  HENT:     Head: Atraumatic.  Eyes:     Extraocular Movements: Extraocular movements intact.     Conjunctiva/sclera: Conjunctivae normal.  Cardiovascular:     Rate and Rhythm: Normal rate and regular rhythm.     Heart sounds: Normal heart sounds.  Pulmonary:      Effort: Pulmonary effort is normal.     Breath sounds:  Normal breath sounds.  Genitourinary:    General: Normal vulva.     Vagina: No vaginal discharge.  Musculoskeletal:        General: Normal range of motion.     Cervical back: Normal range of motion and neck supple.  Skin:    General: Skin is warm and dry.  Neurological:     Mental Status: She is alert and oriented to person, place, and time.  Psychiatric:        Mood and Affect: Mood normal.        Thought Content: Thought content normal.        Judgment: Judgment normal.      UC Treatments / Results  Labs (all labs ordered are listed, but only abnormal results are displayed) Labs Reviewed  POCT URINALYSIS DIP (MANUAL ENTRY)  CERVICOVAGINAL ANCILLARY ONLY    EKG   Radiology No results found.  Procedures Procedures (including critical care time)  Medications Ordered in UC Medications - No data to display  Initial Impression / Assessment and Plan / UC Course  I have reviewed the triage vital signs and the nursing notes.  Pertinent labs & imaging results that were available during my care of the patient were reviewed by me and considered in my medical decision making (see chart for details).     Vitals and exam reassuring, urinalysis without evidence of UTI, vaginal swab pending.  Discussed supportive measures while awaiting results and will adjust if needed based on results.  Final Clinical Impressions(s) / UC Diagnoses   Final diagnoses:  Acute vaginitis   Discharge Instructions   None    ED Prescriptions   None    PDMP not reviewed this encounter.   Particia Nearing, [place] 04/25/23 1143

## 2023-04-25 NOTE — ED Triage Notes (Signed)
Pt reports PCP found blood in her urine x 5 days. Pt is currently  having low back pain and vaginal discomfort x 1 day.

## 2023-05-21 ENCOUNTER — Ambulatory Visit (HOSPITAL_COMMUNITY)
Admission: RE | Admit: 2023-05-21 | Discharge: 2023-05-21 | Disposition: A | Discharge status: Home / Self Care | Payer: BC Managed Care – PPO | Source: Ambulatory Visit | Attending: Internal Medicine | Admitting: Internal Medicine

## 2023-05-21 DIAGNOSIS — R42 Dizziness and giddiness: Secondary | ICD-10-CM | POA: Diagnosis present

## 2023-05-21 LAB — ECHOCARDIOGRAM COMPLETE: Calc EF: 58.8 %

## 2023-05-21 NOTE — Progress Notes (Signed)
  Echocardiogram 2D Echocardiogram has been performed.  Rachel Stevens 05/21/2023, 3:04 PM

## 2023-05-26 ENCOUNTER — Ambulatory Visit
Admission: EM | Admit: 2023-05-26 | Discharge: 2023-05-26 | Disposition: A | Discharge status: Home / Self Care | Payer: BC Managed Care – PPO | Attending: Family Medicine | Admitting: Family Medicine

## 2023-05-26 ENCOUNTER — Encounter: Payer: Self-pay | Admitting: Emergency Medicine

## 2023-05-26 ENCOUNTER — Other Ambulatory Visit: Payer: Self-pay

## 2023-05-26 DIAGNOSIS — R519 Headache, unspecified: Secondary | ICD-10-CM | POA: Insufficient documentation

## 2023-05-26 DIAGNOSIS — Z20822 Contact with and (suspected) exposure to covid-19: Secondary | ICD-10-CM | POA: Insufficient documentation

## 2023-05-26 NOTE — ED Provider Notes (Signed)
[facility]    CSN: [id number] Arrival date & time: 05/26/23  1552      History   Chief Complaint Chief Complaint  Patient presents with   Covid Exposure    HPI Rachel Stevens is a 42 y.o. female.   Patient presenting today requesting COVID testing given a recent COVID exposure.  She states she has had a headache and some dizziness but is unsure if this is related to her chronic Mnire's or possibly some new onset COVID.  Denies fever, chills, upper respiratory symptoms, abdominal pain, nausea vomiting or diarrhea.  Taking meclizine and over-the-counter pain relievers with minimal relief.    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   Hypertension    Irregular periods 01/31/2015   Meniere disease    Missed period 11/01/2014   Patient desires pregnancy 11/01/2014   Pelvic floor tension    Vaginal discharge 11/01/2014   Vulvar pain    Weight gain 11/01/2014    Patient Active Problem List   Diagnosis Date Noted   Rash 03/16/2023   Left knee injury, sequela 03/16/2023   Itching 03/03/2023   Vaginal irritation 03/03/2023   Heart palpitations 01/13/2023   Prediabetes 01/13/2023   Encounter for general adult medical examination with abnormal findings 01/13/2023   Colon wall thickening 01/08/2023   Constipation 01/08/2023   Globus sensation 01/08/2023   Iron deficiency anemia 01/08/2023   Anxiety 08/19/2020   Breast mass in female 03/04/2020   Elevated blood pressure reading 02/21/2020   Obesity BMI=30.9  160 lbs 07/05/2019   Pelvic "burning" x3 years 07/05/2019   History of reduction surgery of right breast 2009 07/05/2019   Depression 07/05/2015   Dizziness 07/05/2015   Fatigue 07/05/2015   Vitamin B12 deficiency 07/05/2015   Vitamin D insufficiency 07/05/2015   Vulvar pain 07/05/2015   Irregular periods 01/31/2015    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   COLONOSCOPY WITH PROPOFOL N/A 03/29/2023   Procedure: COLONOSCOPY WITH PROPOFOL;  Surgeon: Corbin Ade, MD;  [facility]: [facility];  Service: Endoscopy;  Laterality: N/A;  1:30 pm, asa 2   ESOPHAGOGASTRODUODENOSCOPY  03/29/2020   [facility]; normal exam   ESOPHAGOGASTRODUODENOSCOPY (EGD) WITH PROPOFOL N/A 03/29/2023   Procedure: ESOPHAGOGASTRODUODENOSCOPY (EGD) WITH PROPOFOL;  Surgeon: Corbin Ade, MD;  [facility];  Service: Endoscopy;  Laterality: N/A;    OB History     Gravida  1   Para  0   Term  0   Preterm  0   AB  1   Living  0      SAB  1   IAB  0   Ectopic  0   Multiple  0   Live Births  0            Home Medications    Prior to Admission medications   Medication Sig Start Date End Date Taking? Authorizing Provider  ferrous sulfate 325 (65 FE) MG tablet Take 325 mg by mouth daily with breakfast.    [provider]  hydrOXYzine (VISTARIL) 25 MG capsule Take 1 capsule (25 mg total) by mouth every 8 (eight) hours as needed. 02/25/23   Billie Lade, MD  Multiple Vitamin (MULTIVITAMIN) LIQD Take 30 mLs by mouth daily.    [provider]  pantoprazole (PROTONIX) 20 MG tablet Take 1 tablet (20 mg total) by mouth daily. 04/07/23   Billie Lade, MD  triamcinolone cream (KENALOG) 0.1 % Apply 1 Application topically 2 (two) times daily. 03/07/23   Ma Rings, PA-C    Family History Family History  Problem Relation Age of Onset   Hypertension Mother    Heart disease Mother    Cancer Father        throat   Pancreatic cancer Father    Congestive Heart Failure Brother    Heart disease Maternal Grandmother    Hypertension Maternal  Grandmother    Cancer Maternal Grandfather    Hypertension Maternal Grandfather    Cancer Paternal Grandmother        breast   Breast cancer Maternal Aunt    Colon cancer Neg Hx    Inflammatory bowel disease Neg Hx     Social History Social History   Tobacco Use   Smoking status: Never   Smokeless tobacco: Never  Vaping Use   Vaping status: Never Used  Substance Use Topics   Alcohol use: Not Currently   Drug use: No     Allergies   Bee venom, Latex, and Penicillins   Review of Systems Review of Systems Per HPI  Physical Exam Triage Vital Signs ED Triage Vitals  Encounter Vitals Group     BP 05/26/23 1653 110/77     Systolic BP Percentile --      Diastolic BP Percentile --      Pulse Rate 05/26/23 1653 82     Resp 05/26/23 1653 18     Temp 05/26/23 1653 99.2 F (37.3 C)     Temp Source 05/26/23 1653 Oral     SpO2 05/26/23 1653 94 %     Weight --      Height --      Head Circumference --      Peak Flow --      Pain Score 05/26/23 1652 0     Pain Loc --      Pain Education --      Exclude from Growth Chart --    No data found.  Updated Vital Signs BP 110/77 (BP Location: Right Arm)   Pulse 82   Temp 99.2 F (37.3 C) (Oral)   Resp 18   LMP 04/28/2023 (Approximate)   SpO2 94%   Visual Acuity Right Eye Distance:   Left Eye Distance:   Bilateral Distance:    Right Eye Near:   Left Eye Near:    Bilateral Near:     Physical Exam Vitals and nursing note reviewed.  Constitutional:      Appearance: Normal appearance. She is not ill-appearing.  HENT:     Head: Atraumatic.     Mouth/Throat:     Mouth: Mucous membranes are moist.     Pharynx: Oropharynx is clear.  Eyes:     Extraocular Movements: Extraocular movements intact.     Conjunctiva/sclera: Conjunctivae normal.  Cardiovascular:     Rate and Rhythm: Normal rate and regular rhythm.     Heart sounds: Normal heart sounds.  Pulmonary:     Effort: Pulmonary effort is normal.     Breath  sounds: Normal breath sounds. No wheezing or rales.  Musculoskeletal:        General: Normal range of motion.     Cervical back: Normal range of motion and neck supple.  Skin:    General: Skin is warm and dry.  Neurological:     Mental Status: She is alert and oriented to person, place, and time.     Cranial Nerves: No cranial nerve deficit.     Motor: No weakness.     Gait: Gait normal.  Psychiatric:        Mood and Affect: Mood normal.        Thought Content: Thought content normal.        Judgment: Judgment normal.      UC Treatments / Results  Labs (all labs ordered are listed, but only abnormal results are displayed) Labs Reviewed  SARS CORONAVIRUS 2 (TAT 6-24 HRS)    EKG   Radiology No results found.  Procedures Procedures (including critical care time)  Medications Ordered in UC Medications - No data to display  Initial Impression / Assessment and Plan / UC Course  I have reviewed the triage vital signs and the nursing notes.  Pertinent labs & imaging results that were available during my care of the patient were reviewed by me and considered in my medical decision making (see chart for details).     COVID testing pending, vitals and exam very reassuring today.  Continue Mnire's medications, over-the-counter pain relievers, supportive home care.  Good candidate for Paxlovid if positive for COVID.  Final Clinical Impressions(s) / UC Diagnoses   Final diagnoses:  Exposure to COVID-19 virus  Acute nonintractable headache, unspecified headache type     Discharge Instructions      Your COVID results should be available tomorrow and someone will call if positive.    ED Prescriptions   None    PDMP not reviewed this encounter.   Particia Nearing, [place] 05/26/23 [phone]

## 2023-05-26 NOTE — Discharge Instructions (Addendum)
Your COVID results should be available tomorrow and someone will call if positive.

## 2023-05-26 NOTE — ED Triage Notes (Addendum)
Pt reports was exposed to COVID and would like to be tested. Pt reports dizziness and headache x1 week but reports hx of similar and states "I think that is just related to that but I would like to be tested for COVID either way."

## 2023-05-27 LAB — SARS CORONAVIRUS 2 (TAT 6-24 HRS): SARS Coronavirus 2: NEGATIVE

## 2023-06-02 ENCOUNTER — Ambulatory Visit (HOSPITAL_COMMUNITY): Payer: BC Managed Care – PPO

## 2023-06-16 ENCOUNTER — Emergency Department (HOSPITAL_COMMUNITY)
Admission: EM | Admit: 2023-06-16 | Discharge: 2023-06-16 | Disposition: A | Discharge status: Home / Self Care | Payer: BC Managed Care – PPO | Attending: Emergency Medicine | Admitting: Emergency Medicine

## 2023-06-16 ENCOUNTER — Other Ambulatory Visit: Payer: Self-pay

## 2023-06-16 DIAGNOSIS — R112 Nausea with vomiting, unspecified: Secondary | ICD-10-CM | POA: Diagnosis not present

## 2023-06-16 DIAGNOSIS — M545 Low back pain, unspecified: Secondary | ICD-10-CM | POA: Diagnosis present

## 2023-06-16 DIAGNOSIS — R531 Weakness: Secondary | ICD-10-CM | POA: Insufficient documentation

## 2023-06-16 DIAGNOSIS — R109 Unspecified abdominal pain: Secondary | ICD-10-CM | POA: Diagnosis not present

## 2023-06-16 DIAGNOSIS — Z91040 Latex allergy status: Secondary | ICD-10-CM | POA: Insufficient documentation

## 2023-06-16 DIAGNOSIS — M5442 Lumbago with sciatica, left side: Secondary | ICD-10-CM | POA: Insufficient documentation

## 2023-06-16 DIAGNOSIS — M5432 Sciatica, left side: Secondary | ICD-10-CM

## 2023-06-16 LAB — URINALYSIS, ROUTINE W REFLEX MICROSCOPIC
Bilirubin Urine: NEGATIVE
Glucose, UA: NEGATIVE mg/dL
Hgb urine dipstick: NEGATIVE
Ketones, ur: NEGATIVE mg/dL
Leukocytes,Ua: NEGATIVE
Nitrite: NEGATIVE
Protein, ur: NEGATIVE mg/dL
Specific Gravity, Urine: 1.013 (ref 1.005–1.030)
pH: 7 (ref 5.0–8.0)

## 2023-06-16 LAB — CBC
HCT: 38.4 % (ref 36.0–46.0)
Hemoglobin: 12.4 g/dL (ref 12.0–15.0)
MCH: 28.9 pg (ref 26.0–34.0)
MCHC: 32.3 g/dL (ref 30.0–36.0)
MCV: 89.5 fL (ref 80.0–100.0)
Platelets: 239 10*3/uL (ref 150–400)
RBC: 4.29 MIL/uL (ref 3.87–5.11)
RDW: 12.3 % (ref 11.5–15.5)
WBC: 9 10*3/uL (ref 4.0–10.5)
nRBC: 0 % (ref 0.0–0.2)

## 2023-06-16 LAB — BASIC METABOLIC PANEL WITH GFR
Anion gap: 9 (ref 5–15)
BUN: 8 mg/dL (ref 6–20)
CO2: 25 mmol/L (ref 22–32)
Calcium: 9 mg/dL (ref 8.9–10.3)
Chloride: 102 mmol/L (ref 98–111)
Creatinine, Ser: 0.77 mg/dL (ref 0.44–1.00)
GFR, Estimated: >60 mL/min (ref >60)
Glucose, Bld: 97 mg/dL (ref 70–99)
Potassium: 3.5 mmol/L (ref 3.5–5.1)
Sodium: 136 mmol/L (ref 135–145)

## 2023-06-16 LAB — POC URINE PREG, ED: Preg Test, Ur: NEGATIVE

## 2023-06-16 MED ORDER — KETOROLAC TROMETHAMINE 30 MG/ML IJ SOLN
30.0000 mg | Freq: Once | INTRAMUSCULAR | Status: AC
  Administered 2023-06-16: 30 mg via INTRAMUSCULAR
  Filled 2023-06-16: qty 1

## 2023-06-16 MED ORDER — ONDANSETRON 4 MG PO TBDP
4.0000 mg | ORAL_TABLET | Freq: Once | ORAL | Status: AC
  Administered 2023-06-16: 4 mg via ORAL
  Filled 2023-06-16: qty 1

## 2023-06-16 MED ORDER — ONDANSETRON 4 MG PO TBDP: 4.0000 mg | ORAL_TABLET | Freq: Three times a day (TID) | ORAL | 0 refills | Status: AC | PRN

## 2023-06-16 MED ORDER — PREDNISONE 10 MG (21) PO TBPK: ORAL_TABLET | ORAL | 0 refills | Status: AC

## 2023-06-16 MED ORDER — NAPROXEN 500 MG PO TABS: 500.0000 mg | ORAL_TABLET | Freq: Two times a day (BID) | ORAL | 0 refills | Status: AC

## 2023-06-16 MED ORDER — METHOCARBAMOL 500 MG PO TABS: 500.0000 mg | ORAL_TABLET | Freq: Two times a day (BID) | ORAL | 0 refills | Status: AC

## 2023-06-16 NOTE — ED Provider Notes (Signed)
[place] EM[facility] DE[facility]MENT AT [facility]  Provider Note  CSN: [id number] Arrival date & time: 06/16/23 0516  History Chief Complaint  Patient presents with   Flank Pain    Rachel Stevens is a 42 y.o. female with history of sciatica reports several days of L lower back pain, radiating into L leg, similar to previous but also now associated with general weakness and nausea vomiting. She has been taking tylenol without improvement. She denies any urinary symptoms. Back pain is worse with movement.    Home Medications Prior to Admission medications   Medication Sig Start Date End Date Taking? Authorizing Provider  methocarbamol (ROBAXIN) 500 MG tablet Take 1 tablet (500 mg total) by mouth 2 (two) times daily. 06/16/23  Yes Pollyann Savoy, MD  naproxen (NAPROSYN) 500 MG tablet Take 1 tablet (500 mg total) by mouth 2 (two) times daily. 06/16/23  Yes Pollyann Savoy, MD  ondansetron (ZOFRAN-ODT) 4 MG disintegrating tablet Take 1 tablet (4 mg total) by mouth every 8 (eight) hours as needed for nausea or vomiting. 06/16/23  Yes Pollyann Savoy, MD  predniSONE (STERAPRED UNI-PAK 21 TAB) 10 MG (21) TBPK tablet 10mg  Tabs, 6 day taper. Use as directed 06/16/23  Yes Pollyann Savoy, MD  ferrous sulfate 325 (65 FE) MG tablet Take 325 mg by mouth daily with breakfast.    [provider]  hydrOXYzine (VISTARIL) 25 MG capsule Take 1 capsule (25 mg total) by mouth every 8 (eight) hours as needed. 02/25/23   Billie Lade, MD  Multiple Vitamin (MULTIVITAMIN) LIQD Take 30 mLs by mouth daily.    [provider]  pantoprazole (PROTONIX) 20 MG tablet Take 1 tablet (20 mg total) by mouth daily. 04/07/23   Billie Lade, MD  triamcinolone cream (KENALOG) 0.1 % Apply 1 Application topically 2 (two) times daily. 03/07/23   Carmel Sacramento A, PA-C     Allergies    Bee venom, Latex, and Penicillins   Review of Systems   Review of Systems Please see HPI for pertinent  positives and negatives  Physical Exam BP 132/89   Pulse 66   Temp 98.4 F (36.9 C) (Oral)   Resp 15   LMP 04/28/2023 (Approximate)   SpO2 99%   Physical Exam Vitals and nursing note reviewed.  Constitutional:      Appearance: Normal appearance.  HENT:     Head: Normocephalic and atraumatic.     Nose: Nose normal.     Mouth/Throat:     Mouth: Mucous membranes are moist.  Eyes:     Extraocular Movements: Extraocular movements intact.     Conjunctiva/sclera: Conjunctivae normal.  Cardiovascular:     Rate and Rhythm: Normal rate.  Pulmonary:     Effort: Pulmonary effort is normal.     Breath sounds: Normal breath sounds.  Abdominal:     General: Abdomen is flat.     Palpations: Abdomen is soft.     Tenderness: There is no abdominal tenderness.  Musculoskeletal:        General: Tenderness (L lower back) present. No swelling. Normal range of motion.     Cervical back: Neck supple.  Skin:    General: Skin is warm and dry.  Neurological:     General: No focal deficit present.     Mental Status: She is alert and oriented to person, place, and time.     Cranial Nerves: No cranial nerve deficit.     Sensory: No  sensory deficit.     Motor: No weakness.  Psychiatric:        Mood and Affect: Mood normal.     ED Results / Procedures / Treatments   EKG None  Procedures Procedures  Medications Ordered in the ED Medications  ondansetron (ZOFRAN-ODT) disintegrating tablet 4 mg (4 mg Oral Given 06/16/23 0640)  ketorolac (TORADOL) 30 MG/ML injection 30 mg (30 mg Intramuscular Given 06/16/23 0641)    Initial Impression and Plan  Patient here with low back pain, most consistent with MSK/sciatica, however also reports N/V and general weakness at home. She is not vomiting in the ED. Will give ODT Zofran for the N/V and IM toradol for her pain. Check labs.   ED Course   Clinical Course as of 06/16/23 0651  Wed Jun 16, 2023  0630 CBC is normal, UA is clear. HCG is neg.  [CS]   0636 BMP is normal.  [CS]  0649 Labs are normal. Plan discharge with Rx for naprosyn, robaxin and pred-pak for sciatica. PCP follow up, RTED for any other concerns.   [CS]    Clinical Course User Index [CS] Pollyann Savoy, MD     MDM Rules/Calculators/A&P Medical Decision Making Problems Addressed: Nausea and vomiting, unspecified vomiting type: acute illness or injury Sciatica of left side: acute illness or injury  Amount and/or Complexity of Data Reviewed Labs: ordered. Decision-making details documented in ED Course.  Risk Prescription drug management.     Final Clinical Impression(s) / ED Diagnoses Final diagnoses:  Sciatica of left side  Nausea and vomiting, unspecified vomiting type    Rx / DC Orders ED Discharge Orders          Ordered    naproxen (NAPROSYN) 500 MG tablet  2 times daily        06/16/23 0650    methocarbamol (ROBAXIN) 500 MG tablet  2 times daily        06/16/23 0650    predniSONE (STERAPRED UNI-PAK 21 TAB) 10 MG (21) TBPK tablet        06/16/23 0650    ondansetron (ZOFRAN-ODT) 4 MG disintegrating tablet  Every 8 hours PRN        06/16/23 0650             Pollyann Savoy, MD 06/16/23 [phone]

## 2023-06-16 NOTE — ED Triage Notes (Signed)
Pt c/o right flank pain x4 days, states the pain has gotten worse and is now having N/V and generalized weakness.

## 2023-06-18 ENCOUNTER — Ambulatory Visit: Payer: BC Managed Care – PPO | Admitting: Internal Medicine

## 2023-06-30 ENCOUNTER — Ambulatory Visit (HOSPITAL_COMMUNITY): Payer: BC Managed Care – PPO | Admitting: Physical Therapy

## 2023-07-04 NOTE — Progress Notes (Deleted)
Referring Provider: Billie Lade, MD Primary Care Physician:  Billie Lade, MD Primary GI Physician: Dr. Bonnetta Barry chief complaint on file.   HPI:   Rachel Stevens is a 42 y.o. female with history of HTN, chronic back pain, heartburn, anemia, presenting today for follow-up of colitis, constipation, globus sensation, IDA.  Patient last seen in our office at the time of initial consult 01/08/2023.  She reported new onset generalized abdominal pain with constipation not responding to over-the-counter agents about 2 weeks ago.  She had been evaluated in the emergency room on 4/10.  CT showed diffuse colonic wall thickening and she was prescribed Cipro and Flagyl.  By the time of her office visit, her abdominal pain was improving, but she continued with small, incomplete bowel movements.  Also noted mucus in her stool but no BRBPR or melena.  She also reported a couple year history of globus sensation which had previously been evaluated by EGD at [facility] without significant abnormalities per patient's report.  She reported symptoms had resolved with pantoprazole 40 mg daily, but she ran out of this about 3 weeks ago and noted recurrent symptoms.  She resumed pantoprazole about 1+ weeks ago and noted heartburn improved, but still with globus.  No true dysphagia.  Also noted patient had history of mild normocytic anemia with hemoglobin in the 11 range.  Iron saturation and ferritin low normal in July 2023 and ferritin low in December 2023 and she started taking iron supplement thereafter which led to improved hemoglobin.  She did note heavy menstrual bleeding for couple of days, no overt GI bleeding.  Admitted to taking 800 mg of ibuprofen twice a day for several months.  Plan including complete course of antibiotics as prescribed by the ER, MiraLAX twice daily, liquid diet for 24-48 hours, pantoprazole 40 mg daily, avoid NSAIDs, EGD and colonoscopy.  Colonoscopy 03/29/2023: Nonbleeding internal  hemorrhoids, diverticulosis in sigmoid and descending colon, otherwise normal exam including normal-appearing TI.  Recommended repeat colonoscopy in 10 years for screening purposes.  EGD 03/29/2023: Entirely normal exam.  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   COLONOSCOPY WITH PROPOFOL N/A 03/29/2023   Procedure: COLONOSCOPY WITH PROPOFOL;  Surgeon: Corbin Ade, MD;  Location: [facility];  Service: Endoscopy;  Laterality: N/A;  1:30 pm, asa 2   ESOPHAGOGASTRODUODENOSCOPY  03/29/2020   [facility]; normal exam   ESOPHAGOGASTRODUODENOSCOPY (EGD) WITH PROPOFOL N/A 03/29/2023   Procedure: ESOPHAGOGASTRODUODENOSCOPY (EGD) WITH PROPOFOL;  Surgeon: Corbin Ade, MD;  Location: [facility];  Service: Endoscopy;  Laterality: N/A;    Current Outpatient Medications  Medication Sig Dispense Refill   ferrous sulfate 325 (65 FE) MG tablet Take 325 mg by  mouth daily with breakfast.     hydrOXYzine (VISTARIL) 25 MG capsule Take 1 capsule (25 mg total) by mouth every 8 (eight) hours as needed. 30 capsule 0   methocarbamol (ROBAXIN) 500 MG tablet Take 1 tablet (500 mg total) by mouth 2 (two) times daily. 20 tablet 0   Multiple Vitamin (MULTIVITAMIN) LIQD Take 30 mLs by mouth daily.     naproxen (NAPROSYN) 500 MG tablet Take 1 tablet (500 mg total) by mouth 2  (two) times daily. 30 tablet 0   ondansetron (ZOFRAN-ODT) 4 MG disintegrating tablet Take 1 tablet (4 mg total) by mouth every 8 (eight) hours as needed for nausea or vomiting. 20 tablet 0   pantoprazole (PROTONIX) 20 MG tablet Take 1 tablet (20 mg total) by mouth daily. 90 tablet 0   predniSONE (STERAPRED UNI-PAK 21 TAB) 10 MG (21) TBPK tablet 10mg  Tabs, 6 day taper. Use as directed 1 each 0   triamcinolone cream (KENALOG) 0.1 % Apply 1 Application topically 2 (two) times daily. 30 g 0   No current facility-administered medications for this visit.    Allergies as of 07/05/2023 - Review Complete 06/16/2023  Allergen Reaction Noted   Bee venom Other (See Comments) 04/30/2011   Latex Swelling 04/30/2011   Penicillins Hives 04/30/2011    Family History  Problem Relation Age of Onset   Hypertension Mother    Heart disease Mother    Cancer Father        throat   Pancreatic cancer Father    Congestive Heart Failure Brother    Heart disease Maternal Grandmother    Hypertension Maternal Grandmother    Cancer Maternal Grandfather    Hypertension Maternal Grandfather    Cancer Paternal Grandmother        breast   Breast cancer Maternal Aunt    Colon cancer Neg Hx    Inflammatory bowel disease Neg Hx     Social History   Socioeconomic History   Marital status: Divorced    Spouse name: Not on file   Number of children: 9   Years of education: 12   Highest education level: Not on file  Occupational History   Not on file  Tobacco Use   Smoking status: Never   Smokeless tobacco: Never  Vaping Use   Vaping status: Never Used  Substance and Sexual Activity   Alcohol use: Not Currently   Drug use: No   Sexual activity: Yes    Partners: Male    Birth control/protection: Condom  Other Topics Concern   Not on file  Social History Narrative   Not on file   Social Determinants of Health   Financial Resource Strain: Medium Risk (04/15/2022)   Received from [facility]    Overall Financial Resource Strain (CARDIA)  Food Insecurity: Low Risk  (04/19/2023)   Received from [facility]   Hunger Vital Sign    Worried About Running Out of Food in the Last Year: Never true    Ran Out of Food in the Last Year: Never true  Transportation Needs: Not on file (04/19/2023)  Recent Concern: Transportation Needs - Unmet Transportation Needs (04/19/2023)   Received from [facility]   Transportation    In the past 12 months, has lack of reliable transportation kept you from medical appointments, meetings, work or from getting things needed for daily living? : Yes  Physical Activity: Insufficiently Active (04/15/2022)   Received from [facility]   Exercise Vital Sign  Stress: Stress Concern  Present (04/15/2022)   Received from [facility] - Occupational Stress Questionnaire  Social Connections: Socially Isolated (04/15/2022)   Received from [facility]   Social Connection and Isolation Panel [NHANES]    Review of Systems: Gen: Denies fever, chills, anorexia. Denies fatigue, weakness, weight loss.  CV: Denies chest pain, palpitations, syncope, peripheral edema, and claudication. Resp: Denies dyspnea at rest, cough, wheezing, coughing up blood, and pleurisy. GI: Denies vomiting blood, jaundice, and fecal incontinence.   Denies dysphagia or odynophagia. Derm: Denies rash, itching, dry skin Psych: Denies depression, anxiety, memory loss, confusion. No homicidal or suicidal ideation.  Heme: Denies bruising, bleeding, and enlarged lymph nodes.  Physical Exam: There were no vitals taken for this visit. General:   Alert and oriented. No distress noted. Pleasant and cooperative.  Head:  Normocephalic and atraumatic. Eyes:  Conjuctiva clear without scleral icterus. Heart:  S1, S2 present without murmurs appreciated. Lungs:  Clear to auscultation bilaterally. No wheezes, rales, or rhonchi. No distress.  Abdomen:  +BS, soft,  non-tender and non-distended. No rebound or guarding. No HSM or masses noted. Msk:  Symmetrical without gross deformities. Normal posture. Extremities:  Without edema. Neurologic:  Alert and  oriented x4 Psych:  Normal mood and affect.    Assessment:     Plan:  ***   Ermalinda Memos, PA-C [facility] 07/05/2023

## 2023-07-05 ENCOUNTER — Ambulatory Visit: Payer: BC Managed Care – PPO | Admitting: Gastroenterology

## 2023-07-05 ENCOUNTER — Encounter: Payer: Self-pay | Admitting: Gastroenterology
